# Patient Record
Sex: Male | Born: 1950 | Race: White | Hispanic: No | Marital: Married | Smoking: Former smoker
Health system: Southern US, Community
[De-identification: ages and names within clinical notes are randomized; demographics above are authoritative.]

## PROBLEM LIST (undated history)

## (undated) DIAGNOSIS — E663 Overweight: Secondary | ICD-10-CM

## (undated) DIAGNOSIS — K219 Gastro-esophageal reflux disease without esophagitis: Secondary | ICD-10-CM

## (undated) DIAGNOSIS — C801 Malignant (primary) neoplasm, unspecified: Secondary | ICD-10-CM

## (undated) DIAGNOSIS — E785 Hyperlipidemia, unspecified: Secondary | ICD-10-CM

## (undated) DIAGNOSIS — I10 Essential (primary) hypertension: Secondary | ICD-10-CM

## (undated) DIAGNOSIS — Z87442 Personal history of urinary calculi: Secondary | ICD-10-CM

## (undated) DIAGNOSIS — D2360 Other benign neoplasm of skin of unspecified upper limb, including shoulder: Secondary | ICD-10-CM

## (undated) HISTORY — PX: WRIST SURGERY: SHX841

## (undated) HISTORY — DX: Other benign neoplasm of skin of unspecified upper limb, including shoulder: D23.60

## (undated) HISTORY — DX: Overweight: E66.3

## (undated) HISTORY — PX: FINGER SURGERY: SHX640

## (undated) HISTORY — DX: Hyperlipidemia, unspecified: E78.5

## (undated) HISTORY — DX: Essential (primary) hypertension: I10

## (undated) HISTORY — PX: COLONOSCOPY: SHX174

## (undated) HISTORY — PX: OTHER SURGICAL HISTORY: SHX169

---

## 1999-07-28 ENCOUNTER — Encounter: Admission: RE | Admit: 1999-07-28 | Discharge: 1999-08-04 | Payer: Self-pay | Admitting: *Deleted

## 2003-09-07 LAB — HM COLONOSCOPY: HM Colonoscopy: NORMAL

## 2004-04-18 ENCOUNTER — Ambulatory Visit (HOSPITAL_COMMUNITY): Admission: RE | Admit: 2004-04-18 | Discharge: 2004-04-18 | Payer: Self-pay | Admitting: Gastroenterology

## 2007-12-31 ENCOUNTER — Encounter: Admission: RE | Admit: 2007-12-31 | Discharge: 2007-12-31 | Payer: Self-pay | Admitting: Family Medicine

## 2008-09-12 ENCOUNTER — Emergency Department (HOSPITAL_COMMUNITY): Admission: EM | Admit: 2008-09-12 | Discharge: 2008-09-12 | Payer: Self-pay | Admitting: Family Medicine

## 2009-04-21 ENCOUNTER — Ambulatory Visit (HOSPITAL_COMMUNITY): Admission: RE | Admit: 2009-04-21 | Discharge: 2009-04-21 | Payer: Self-pay | Admitting: Orthopedic Surgery

## 2010-12-19 ENCOUNTER — Encounter: Payer: Self-pay | Admitting: Nurse Practitioner

## 2010-12-19 DIAGNOSIS — E785 Hyperlipidemia, unspecified: Secondary | ICD-10-CM

## 2010-12-19 DIAGNOSIS — E1159 Type 2 diabetes mellitus with other circulatory complications: Secondary | ICD-10-CM | POA: Insufficient documentation

## 2010-12-19 DIAGNOSIS — I10 Essential (primary) hypertension: Secondary | ICD-10-CM

## 2010-12-19 DIAGNOSIS — E119 Type 2 diabetes mellitus without complications: Secondary | ICD-10-CM | POA: Insufficient documentation

## 2010-12-19 DIAGNOSIS — I152 Hypertension secondary to endocrine disorders: Secondary | ICD-10-CM | POA: Insufficient documentation

## 2010-12-19 DIAGNOSIS — M2550 Pain in unspecified joint: Secondary | ICD-10-CM | POA: Insufficient documentation

## 2010-12-22 NOTE — Op Note (Signed)
NAME:  Philip Huang, Philip Huang                         ACCOUNT NO.:  [id number]   MEDICAL RECORD NO.:  [id number]                   PATIENT TYPE:  AMB   LOCATION:  ENDO                                 FACILITY:  [facility]   PHYSICIAN:  John C. Madilyn Fireman, M.D.                 DATE OF BIRTH:  [birth year]   DATE OF PROCEDURE:  04/18/2004  DATE OF DISCHARGE:                                 OPERATIVE REPORT   PROCEDURE PERFORMED:  Colonoscopy.   ENDOSCOPIST:  Barrie Folk, M.D.   INDICATIONS FOR PROCEDURE:  Average risk colon cancer screening in a 51-year-  old patient.   DESCRIPTION OF PROCEDURE:  The patient was placed in the left lateral  decubitus position and placed on the pulse monitor with continuous low-flow  oxygen delivered by nasal cannula.  The patient was sedated with 70 mcg of  IV fentanyl and 7 mg of IV Versed.  The Olympus video colonoscope was  inserted into the rectum and advanced to the cecum, confirmed by  transillumination of McBurney's point and visualization of the ileocecal  valve and appendiceal orifice.  The prep was excellent.  The cecum,  ascending, transverse and descending colon appeared normal with no masses,  polyps, diverticula or other mucosal abnormalities.  The rectum likewise  appeared normal and retroflex view of the anus revealed no obvious internal  hemorrhoids.  The scope was then withdrawn and the patient returned to the  recovery room in stable condition.  He tolerated the procedure well.  There  were no immediate complications.   IMPRESSION:  Normal screening colonoscopy.   PLAN:  Next colon screening by sigmoidoscopy in 5 years.                                               John C. Madilyn Fireman, M.D.    [facility]MED[facility]  D:  04/18/2004  T:  04/18/2004  Job:  [id number]   cc:   Ernestina Penna, M.D.  [address] [place]  [place] [address]  Fax: [phone]

## 2011-02-02 ENCOUNTER — Emergency Department (HOSPITAL_COMMUNITY)
Admission: EM | Admit: 2011-02-02 | Discharge: 2011-02-02 | Discharge status: Against Medical Advice | Payer: Managed Care, Other (non HMO) | Attending: Emergency Medicine | Admitting: Emergency Medicine

## 2011-02-02 ENCOUNTER — Emergency Department (HOSPITAL_COMMUNITY): Payer: Managed Care, Other (non HMO)

## 2011-02-02 DIAGNOSIS — R079 Chest pain, unspecified: Secondary | ICD-10-CM | POA: Insufficient documentation

## 2011-02-02 DIAGNOSIS — Z79899 Other long term (current) drug therapy: Secondary | ICD-10-CM | POA: Insufficient documentation

## 2011-02-02 DIAGNOSIS — E7800 Pure hypercholesterolemia, unspecified: Secondary | ICD-10-CM | POA: Insufficient documentation

## 2011-02-02 DIAGNOSIS — I10 Essential (primary) hypertension: Secondary | ICD-10-CM | POA: Insufficient documentation

## 2011-02-02 DIAGNOSIS — E119 Type 2 diabetes mellitus without complications: Secondary | ICD-10-CM | POA: Insufficient documentation

## 2011-02-02 LAB — DIFFERENTIAL
Basophils Relative: 0 % (ref 0–1)
Eosinophils Absolute: 0.4 10*3/uL (ref 0.0–0.7)
Monocytes Absolute: 0.6 10*3/uL (ref 0.1–1.0)
Monocytes Relative: 9 % (ref 3–12)
Neutrophils Relative %: 55 % (ref 43–77)

## 2011-02-02 LAB — BASIC METABOLIC PANEL
Calcium: 9.1 mg/dL (ref 8.4–10.5)
Creatinine, Ser: 0.92 mg/dL (ref 0.50–1.35)
GFR calc Af Amer: >60 mL/min (ref >60)
GFR calc non Af Amer: >60 mL/min (ref >60)

## 2011-02-02 LAB — CK TOTAL AND CKMB (NOT AT ARMC): Total CK: 310 U/L — ABNORMAL HIGH (ref 7–232)

## 2011-02-02 LAB — CBC
MCH: 29.8 pg (ref 26.0–34.0)
MCHC: 34.9 g/dL (ref 30.0–36.0)
Platelets: 219 10*3/uL (ref 150–400)

## 2011-02-02 LAB — D-DIMER, QUANTITATIVE: D-Dimer, Quant: 0.26 ug/mL-FEU (ref 0.00–0.48)

## 2011-02-16 ENCOUNTER — Ambulatory Visit (INDEPENDENT_AMBULATORY_CARE_PROVIDER_SITE_OTHER): Payer: Managed Care, Other (non HMO) | Admitting: Cardiology

## 2011-02-16 ENCOUNTER — Encounter: Payer: Self-pay | Admitting: Cardiology

## 2011-02-16 DIAGNOSIS — E785 Hyperlipidemia, unspecified: Secondary | ICD-10-CM

## 2011-02-16 DIAGNOSIS — I10 Essential (primary) hypertension: Secondary | ICD-10-CM

## 2011-02-16 DIAGNOSIS — E119 Type 2 diabetes mellitus without complications: Secondary | ICD-10-CM

## 2011-02-16 DIAGNOSIS — E663 Overweight: Secondary | ICD-10-CM

## 2011-02-16 DIAGNOSIS — R079 Chest pain, unspecified: Secondary | ICD-10-CM | POA: Insufficient documentation

## 2011-02-16 DIAGNOSIS — Z6828 Body mass index (BMI) 28.0-28.9, adult: Secondary | ICD-10-CM | POA: Insufficient documentation

## 2011-02-16 NOTE — Progress Notes (Signed)
HPI The patient  Presents for evaluation of chest discomfort. This started a couple of weeks ago. He really only happens when he is exerting himself standing in one position at work. He apparently works with his arms. He describes some midsternal discomfort. It is mild. Comes and goes on its own. Is not sure how long it lasts. He does not describe radiation to his neck or to his arms.  He does not describe associated nausea vomiting or diaphoresis. He has no shortness of breath, PND or orthopnea. This is a new onset complaint and he has not had this before. He has had no prior cardiac history although he has significant risk factors.  Allergies no known allergies  Current Outpatient Prescriptions  Medication Sig Dispense Refill  . aspirin 81 MG tablet Take 325 mg by mouth daily.       . Cholecalciferol (VITAMIN D) 2000 UNITS tablet Take 2,000 Units by mouth daily.        . fenofibrate 160 MG tablet Take 160 mg by mouth daily.        Marland Kitchen lisinopril-hydrochlorothiazide (PRINZIDE,ZESTORETIC) 20-12.5 MG per tablet Take 1 tablet by mouth daily.        . metFORMIN (GLUCOPHAGE) 1000 MG tablet Take 1,000 mg by mouth 2 (two) times daily with a meal.        . Multiple Vitamins-Minerals (MENS 50+ ADVANCED PO) Take by mouth.        . nitroGLYCERIN (NITROSTAT) 0.4 MG SL tablet Place 0.4 mg under the tongue every 5 (five) minutes as needed.        . rosuvastatin (CRESTOR) 10 MG tablet Take 20 mg by mouth daily.       . vitamin C (ASCORBIC ACID) 500 MG tablet Take 500 mg by mouth daily.        . diclofenac (VOLTAREN) 75 MG EC tablet Take 75 mg by mouth 2 (two) times daily.        Marland Kitchen glipiZIDE (GLUCOTROL) 10 MG 24 hr tablet Take 10 mg by mouth daily.          Past Medical History  Diagnosis Date  . HTN (hypertension)   . Hyperlipidemia   . Diabetes mellitus   . Overweight     Past Surgical History  Procedure Date  . None     Family History  Problem Relation Age of Onset  . Diabetes Mother   . Heart  disease Mother 51    CABG  . Hyperlipidemia Mother   . Hypertension Mother   . Parkinsonism Father   . Diabetes Sister   . Hyperlipidemia Sister   . Hypertension Sister   . Diabetes Brother   . Hyperlipidemia Brother   . Hypertension Brother     History   Social History  . Marital Status: Married    Spouse Name: N/A    Number of Children: 2  . Years of Education: N/A   Occupational History  .  [employer]   Social History Main Topics  . Smoking status: Never Smoker   . Smokeless tobacco: Never Used  . Alcohol Use: Not on file  . Drug Use: Not on file  . Sexually Active: Not on file   Other Topics Concern  . Not on file   Social History Narrative  . No narrative on file    ROS:  As stated in the HPI and negative for all other systems.   PHYSICAL EXAM BP 132/97  Pulse 80  Resp 18  Ht 5\' 9"  (1.753 m)  Wt 213 lb 6.4 oz (96.798 kg)  BMI 31.51 kg/m2 GENERAL:  Well appearing HEENT:  Pupils equal round and reactive, fundi not visualized, oral mucosa unremarkable NECK:  No jugular venous distention, waveform within normal limits, carotid upstroke brisk and symmetric, no bruits, no thyromegaly LYMPHATICS:  No cervical, inguinal adenopathy LUNGS:  Clear to auscultation bilaterally BACK:  No CVA tenderness CHEST:  Unremarkable HEART:  PMI not displaced or sustained,S1 and S2 within normal limits, no S3, no S4, no clicks, no rubs, no murmurs ABD:  Flat, positive bowel sounds normal in frequency in pitch, no bruits, no rebound, no guarding, no midline pulsatile mass, no hepatomegaly, no splenomegaly EXT:  2 plus pulses throughout, no edema, no cyanosis no clubbing SKIN:  No rashes no nodules NEURO:  Cranial nerves II through XII grossly intact, motor grossly intact throughout PSYCH:  Cognitively intact, oriented to person place and time   EKG:  Sinus rhythm, rate 80, axis within normal limits, intervals within normal limits, no acute ST-T wave  changes.   ASSESSMENT AND PLAN

## 2011-02-16 NOTE — Assessment & Plan Note (Signed)
The patient has new onset chest pain and this is quite concerning for unstable angina. I think the pretest probability of obstructive coronary disease is at least moderately high. Therefore, stress perfusion imaging would be indicated. I would not believe the results of plain exercise treadmill test as I think the sensitivity is too low in this situation. This is per appropriateness guidelines.

## 2011-02-16 NOTE — Assessment & Plan Note (Signed)
The patient understands the need to lose weight with diet and exercise. We have discussed specific strategies for this.  

## 2011-02-16 NOTE — Patient Instructions (Signed)
Your physician has requested that you have en exercise stress myoview. For further information please visit [email or web address] Please follow instruction sheet, as given.  Please continue your current medications  Follow up will be as needed

## 2011-02-16 NOTE — Assessment & Plan Note (Signed)
Is limited and not at target. I reviewed these. These had his meds adjusted recently and I will defer to the excellent management from his primary providers.

## 2011-02-16 NOTE — Assessment & Plan Note (Signed)
The blood pressure is at target. No change in medications is indicated. We will continue with therapeutic lifestyle changes (TLC).  

## 2011-02-20 ENCOUNTER — Encounter: Payer: Self-pay | Admitting: *Deleted

## 2011-02-22 ENCOUNTER — Ambulatory Visit (HOSPITAL_COMMUNITY): Payer: Managed Care, Other (non HMO) | Attending: Cardiology | Admitting: Radiology

## 2011-02-22 DIAGNOSIS — I10 Essential (primary) hypertension: Secondary | ICD-10-CM | POA: Insufficient documentation

## 2011-02-22 DIAGNOSIS — E119 Type 2 diabetes mellitus without complications: Secondary | ICD-10-CM

## 2011-02-22 DIAGNOSIS — R5381 Other malaise: Secondary | ICD-10-CM | POA: Insufficient documentation

## 2011-02-22 DIAGNOSIS — R5383 Other fatigue: Secondary | ICD-10-CM | POA: Insufficient documentation

## 2011-02-22 DIAGNOSIS — I4949 Other premature depolarization: Secondary | ICD-10-CM

## 2011-02-22 DIAGNOSIS — Z8249 Family history of ischemic heart disease and other diseases of the circulatory system: Secondary | ICD-10-CM | POA: Insufficient documentation

## 2011-02-22 DIAGNOSIS — R0789 Other chest pain: Secondary | ICD-10-CM

## 2011-02-22 MED ORDER — TECHNETIUM TC 99M TETROFOSMIN IV KIT
33.0000 | PACK | Freq: Once | INTRAVENOUS | Status: AC | PRN
  Administered 2011-02-22: 33 via INTRAVENOUS

## 2011-02-22 MED ORDER — TECHNETIUM TC 99M TETROFOSMIN IV KIT
11.0000 | PACK | Freq: Once | INTRAVENOUS | Status: AC | PRN
  Administered 2011-02-22: 11 via INTRAVENOUS

## 2011-02-22 NOTE — Progress Notes (Addendum)
[facility] SITE 3 NUCLEAR MED [address] [place] [address] [phone]  Cardiology Nuclear Med Study  Philip Huang is a 60 y.o. male [id number] [birth year]   Nuclear Med Background Indication for Stress Test:  Evaluation for Ischemia and Post Hospital on 02/02/11: Chest pain with (+) enzymes (patient refused admission , Left AMA) History:  No previous documented CAD and '87 GXT:OK per patient Cardiac Risk Factors: Family History - CAD, Hypertension, Lipids and NIDDM  Symptoms:  Chest Pressure with and without Exertion (none since leaving hospital) and Fatigue   Nuclear Pre-Procedure Caffeine/Decaff Intake:  None NPO After: 7:00pm   Lungs:  Clear. IV 0.9% NS with Angio Cath:  20g  IV Site: R Wrist  IV Started by:  Irean Hong, RN  Chest Size (in):  44 Cup Size: n/a  Height: 5\' 9"  (1.753 m)  Weight:  207 lb (93.895 kg)  BMI:  Body mass index is 30.57 kg/(m^2). Tech Comments:  Held diabetic po medication today.    Nuclear Med Study 1 or 2 day study: 1 day  Stress Test Type:  Stress  Reading MD: Dietrich Pates, MD  Order Authorizing Provider:  Rollene Rotunda, MD  Resting Radionuclide: Technetium 67m Tetrofosmin  Resting Radionuclide Dose: 11 mCi   Stress Radionuclide:  Technetium 60m Tetrofosmin  Stress Radionuclide Dose: 33 mCi           Stress Protocol Rest HR: 74 Stress HR: 151  Rest BP: 123/71 Stress BP: 198/99  Exercise Time (min): 7:21 METS: 9.0   Predicted Max HR: 162 bpm % Max HR: 93.21 bpm Rate Pressure Product: 29528   Dose of Adenosine (mg):  n/a Dose of Lexiscan: n/a mg  Dose of Atropine (mg): n/a Dose of Dobutamine: n/a mcg/kg/min (at max HR)  Stress Test Technologist: Smiley Houseman, CMA-N  Nuclear Technologist:  Domenic Polite, CNMT     Rest Procedure:  Myocardial perfusion imaging was performed at rest 45 minutes following the intravenous administration of Technetium 65m Tetrofosmin.  Rest ECG: No acute changes.  Stress  Procedure:  The patient exercised for 7:21 on the treadmill utilizing the Bruce protocol.  The patient stopped due to fatigue and denied any chest pain.  There were no significant ST-T wave changes during exercise, but there were nonspecific T-wave changes in recovery and occasional PVC's throughout.  Technetium 28m Tetrofosmin was injected at peak exercise and myocardial perfusion imaging was performed after a brief delay.  Stress ECG: No significant change from baseline ECG  QPS Raw Data Images:  Stress images were motion corrected.  Soft tissue (diaphragm, bowel activity) underlie heart. Stress Images:  Minimal thinning in the inferior wall (mid).  Otherwise normal perfusion. Rest Images: Inferior changes a little more prominent. Subtraction (SDS):  No evidence of ischemia. Transient Ischemic Dilatation (Normal <1.22):  1.05 Lung/Heart Ratio (Normal <0.45):  .42  Quantitative Gated Spect Images QGS EDV:  97 ml QGS ESV:  31 ml QGS cine images:  NL LV Function; NL Wall Motion QGS EF: 68%  Impression Exercise Capacity:  Good exercise capacity. BP Response:  Normal blood pressure response. Clinical Symptoms:  No chest pain. ECG Impression:  No significant ST segment change suggestive of ischemia. Comparison with Prior Nuclear Study: No previous nuclear study performed  Overall Impression:  Normal perfusion with minimal soft tissue attenuation inferiorly.  No ischemia or scar. Dietrich Pates  No evidence of ischemia or infarct.  No further work up.  Rollene Rotunda

## 2011-02-23 ENCOUNTER — Encounter: Payer: Self-pay | Admitting: Cardiology

## 2011-02-23 NOTE — Progress Notes (Signed)
NUCLEAR REPORT TO DR. HOCHREIN. Kieth Hartis, Farris Has

## 2011-02-26 NOTE — Progress Notes (Signed)
Left message of results on pt's private voicemail. 

## 2011-03-07 ENCOUNTER — Encounter: Payer: Self-pay | Admitting: Cardiology

## 2012-02-22 ENCOUNTER — Other Ambulatory Visit: Payer: Self-pay | Admitting: Family Medicine

## 2012-02-25 ENCOUNTER — Ambulatory Visit (HOSPITAL_COMMUNITY)
Admission: RE | Admit: 2012-02-25 | Discharge: 2012-02-25 | Disposition: A | Discharge status: Home / Self Care | Payer: Managed Care, Other (non HMO) | Source: Ambulatory Visit | Attending: Family Medicine | Admitting: Family Medicine

## 2012-02-25 DIAGNOSIS — K7689 Other specified diseases of liver: Secondary | ICD-10-CM | POA: Insufficient documentation

## 2012-02-25 DIAGNOSIS — R748 Abnormal levels of other serum enzymes: Secondary | ICD-10-CM | POA: Insufficient documentation

## 2012-02-25 DIAGNOSIS — R161 Splenomegaly, not elsewhere classified: Secondary | ICD-10-CM | POA: Insufficient documentation

## 2012-06-18 IMAGING — CR DG CHEST 2V
2 series · 2 of 2 positions shown · non-contrast
Comparison: 09/12/2008

CLINICAL DATA: Chest pain, diabetes, hypertension

CHEST - 2 VIEW

[w chest pa]
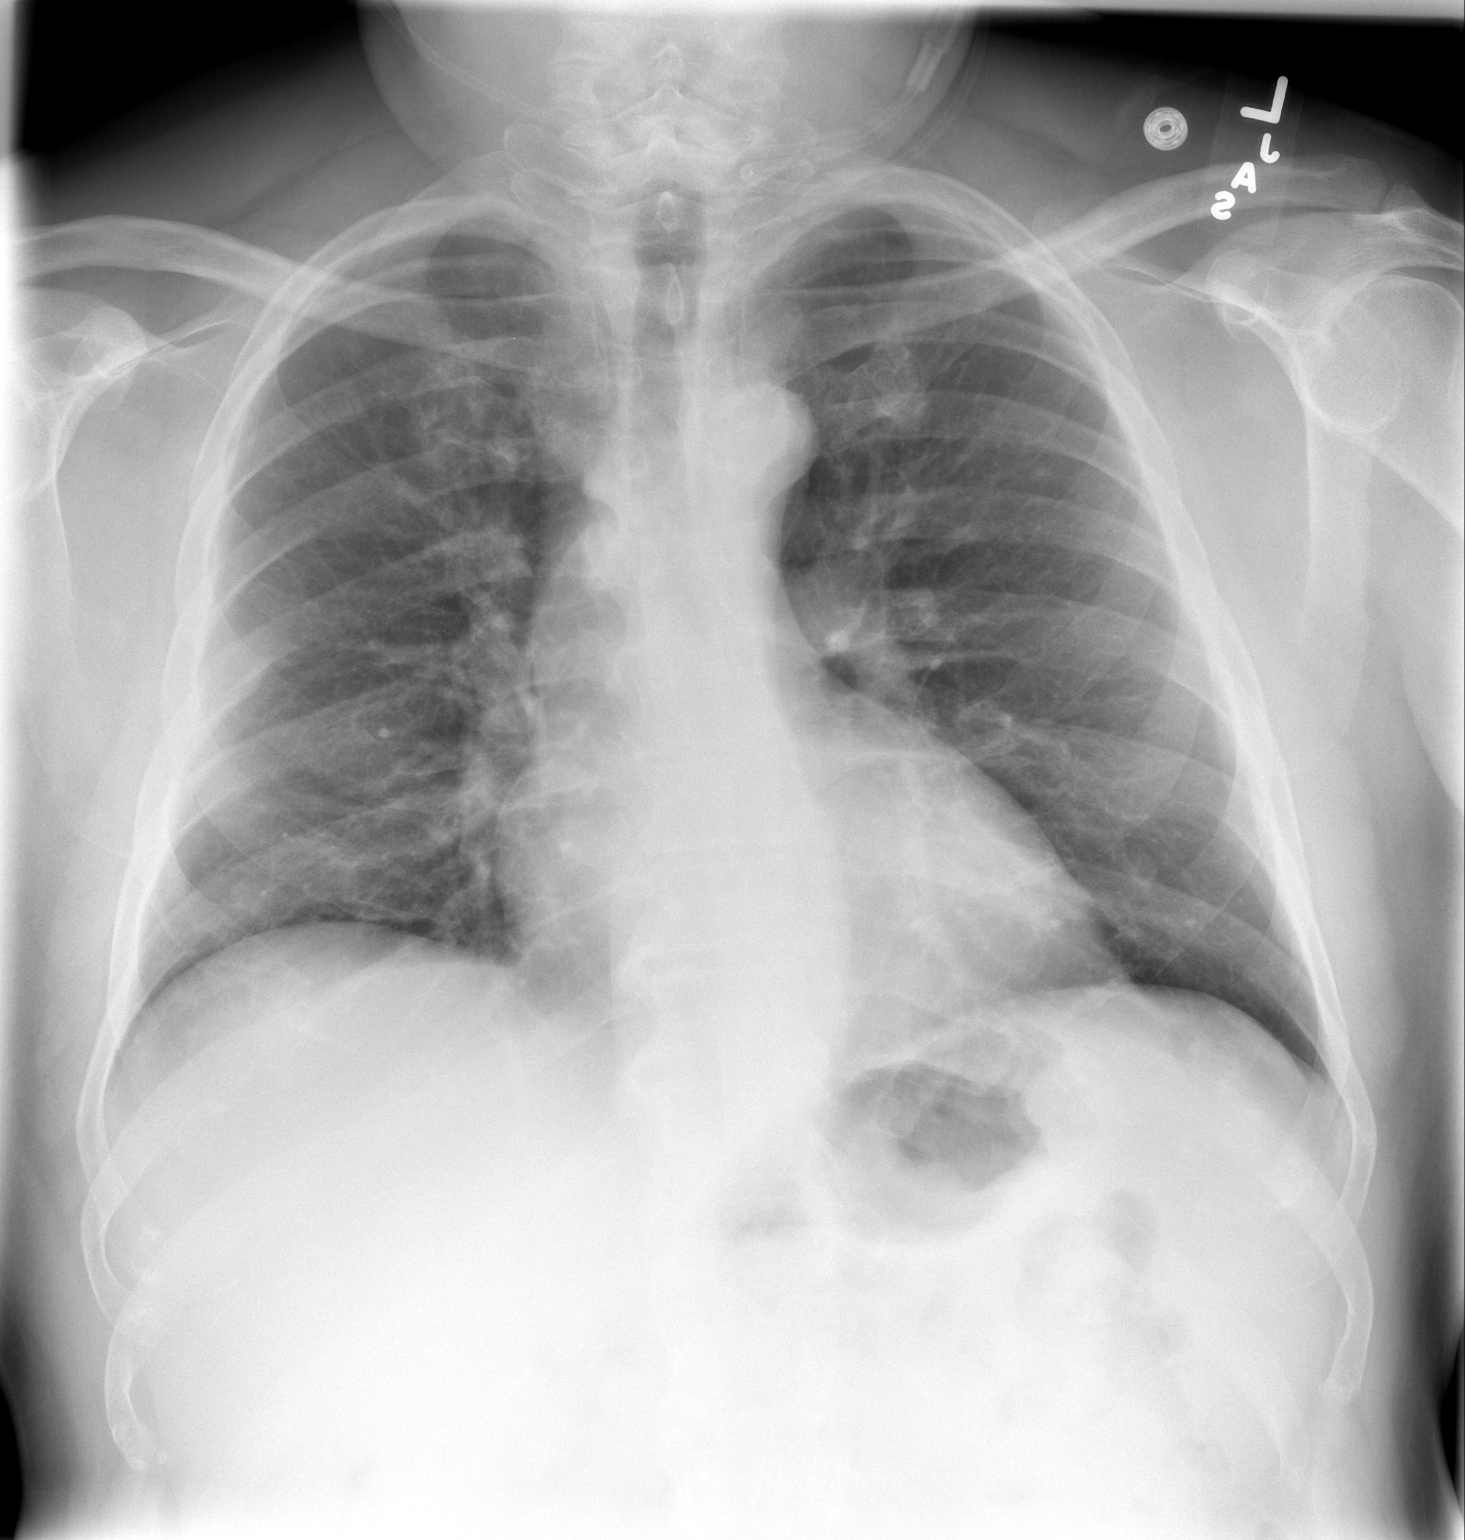

[w chest lat]
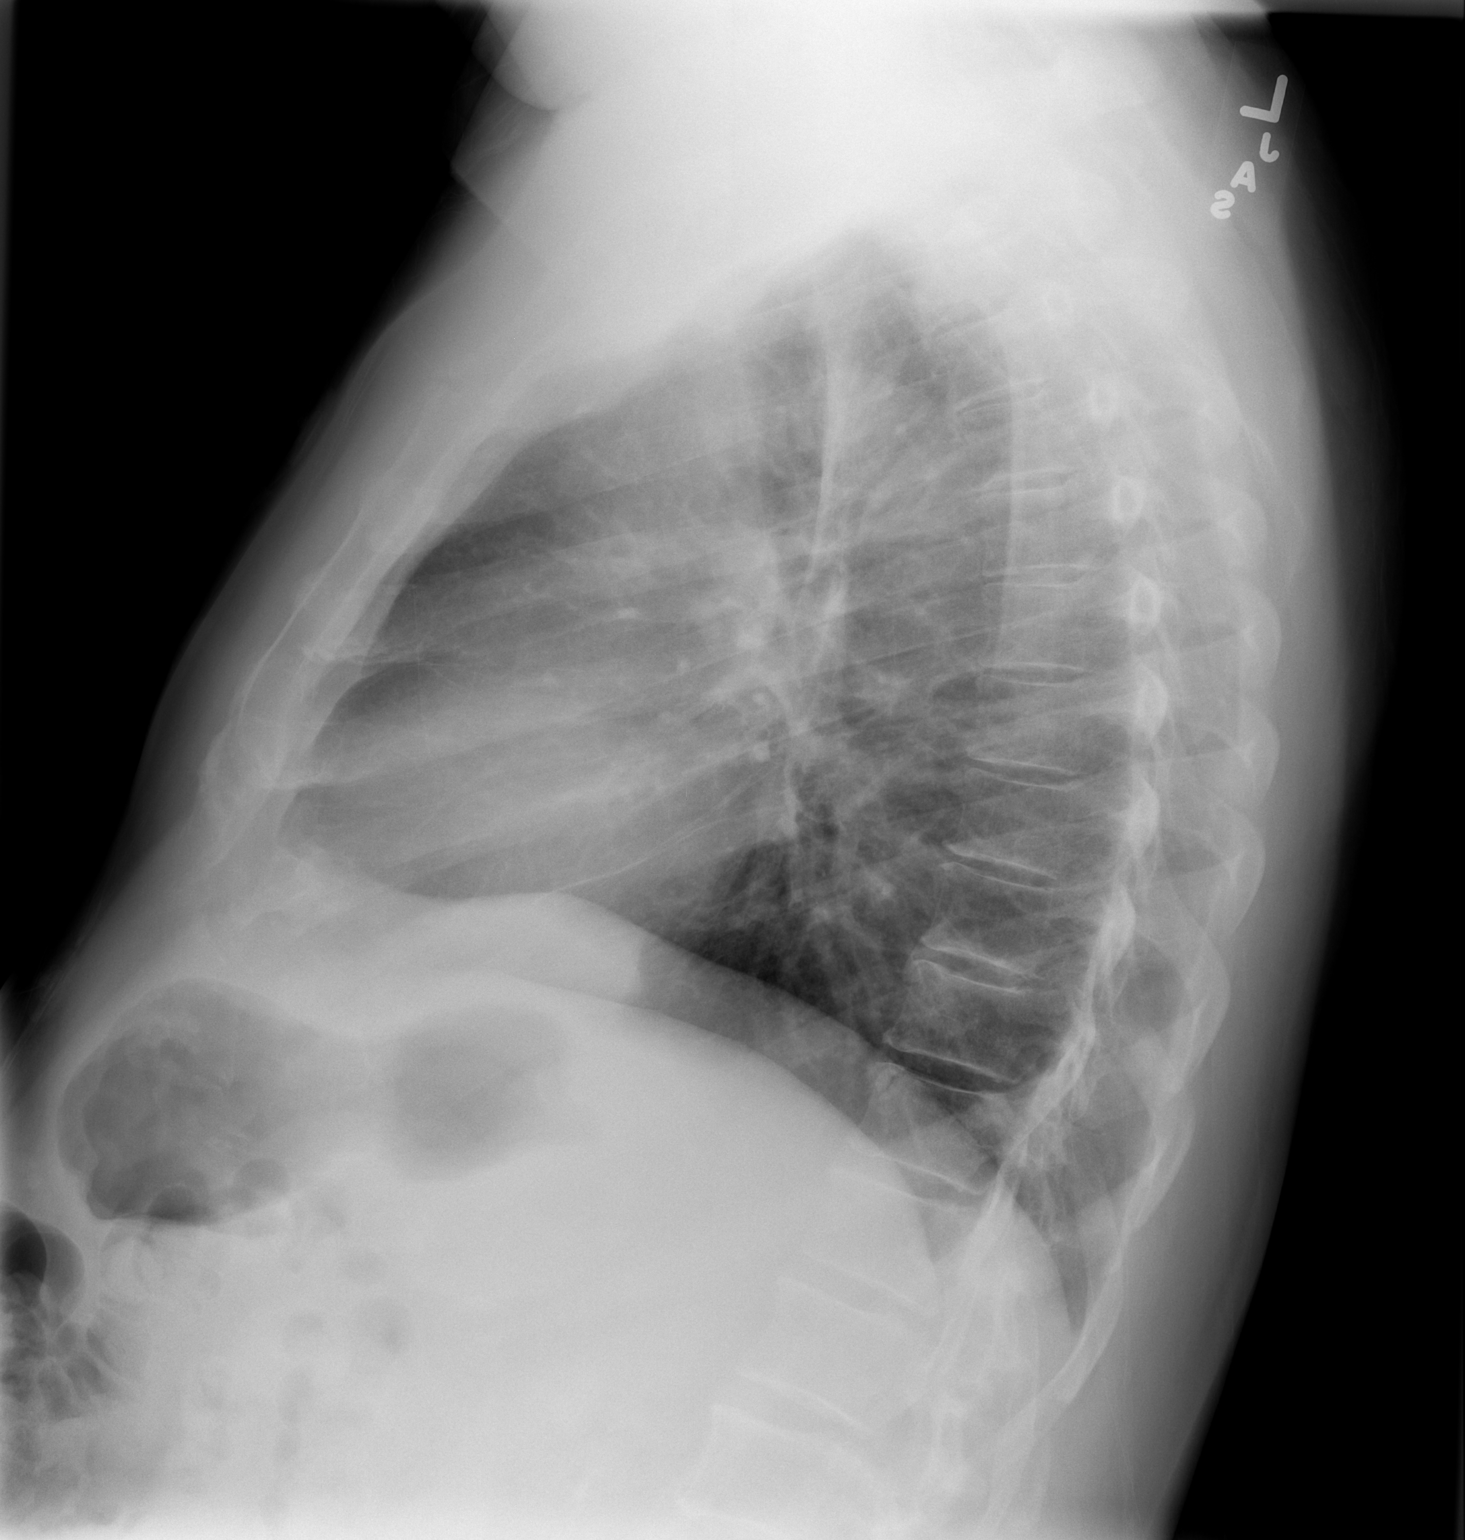

[2 of 2 positions shown; findings below may reference images not displayed]

FINDINGS: Enlargement of cardiac silhouette.
Tortuous aorta.
Pulmonary vascularity normal.
Mild right basilar atelectasis.
Question bilateral nipple shadows.
No pulmonary infiltrate, pleural effusion, or pneumothorax.
Multilevel endplate spur formation thoracic spine.
Probable tiny calcified granuloma left upper lobe.
Old fracture posterolateral right 6th rib.
Minimal peribronchial thickening.
IMPRESSION: Minimal bronchitic changes in right basilar atelectasis.
Question bilateral nipple shadows; recommend repeat PA chest
radiograph with nipple markers to exclude pulmonary nodules.

## 2012-10-22 ENCOUNTER — Telehealth: Payer: Self-pay | Admitting: Nurse Practitioner

## 2012-10-22 NOTE — Telephone Encounter (Signed)
Samples zetia and crestor please.

## 2012-10-22 NOTE — Telephone Encounter (Signed)
Samples of crestor 20mg  and zetia 10mg  up front pt aware

## 2012-10-22 NOTE — Telephone Encounter (Signed)
Ok to give samples if we have .

## 2012-10-22 NOTE — Telephone Encounter (Signed)
Chart on desk

## 2012-11-07 ENCOUNTER — Telehealth: Payer: Self-pay | Admitting: Nurse Practitioner

## 2012-11-07 NOTE — Telephone Encounter (Signed)
PLEASE ADVISE.

## 2012-11-07 NOTE — Telephone Encounter (Signed)
Ok for samples that we have

## 2012-11-07 NOTE — Telephone Encounter (Signed)
Samples up front 

## 2012-11-11 ENCOUNTER — Other Ambulatory Visit: Payer: Self-pay | Admitting: *Deleted

## 2012-11-11 MED ORDER — FENOFIBRATE 160 MG PO TABS: 160.0000 mg | ORAL_TABLET | Freq: Every day | ORAL | Status: DC

## 2012-11-13 ENCOUNTER — Other Ambulatory Visit: Payer: Self-pay | Admitting: *Deleted

## 2012-11-13 ENCOUNTER — Telehealth: Payer: Self-pay | Admitting: Nurse Practitioner

## 2012-11-13 MED ORDER — FENOFIBRATE 160 MG PO TABS: 160.0000 mg | ORAL_TABLET | Freq: Every day | ORAL | Status: DC

## 2012-11-13 NOTE — Telephone Encounter (Signed)
Last labs 9/13

## 2012-12-17 ENCOUNTER — Other Ambulatory Visit: Payer: Self-pay | Admitting: Nurse Practitioner

## 2013-01-04 ENCOUNTER — Other Ambulatory Visit: Payer: Self-pay | Admitting: Nurse Practitioner

## 2013-01-05 ENCOUNTER — Telehealth: Payer: Self-pay | Admitting: Nurse Practitioner

## 2013-01-06 ENCOUNTER — Telehealth: Payer: Self-pay | Admitting: Nurse Practitioner

## 2013-01-06 MED ORDER — FENOFIBRATE 160 MG PO TABS: 160.0000 mg | ORAL_TABLET | Freq: Every day | ORAL | Status: DC

## 2013-01-06 NOTE — Telephone Encounter (Signed)
1 month only given, must be seen

## 2013-01-07 ENCOUNTER — Ambulatory Visit (INDEPENDENT_AMBULATORY_CARE_PROVIDER_SITE_OTHER): Payer: BLUE CROSS/BLUE SHIELD | Admitting: General Practice

## 2013-01-07 ENCOUNTER — Telehealth: Payer: Self-pay | Admitting: Nurse Practitioner

## 2013-01-07 ENCOUNTER — Encounter: Payer: Self-pay | Admitting: General Practice

## 2013-01-07 VITALS — BP 124/73 | HR 85 | Temp 99.2°F | Ht 68.5 in | Wt 203.0 lb

## 2013-01-07 DIAGNOSIS — L255 Unspecified contact dermatitis due to plants, except food: Secondary | ICD-10-CM

## 2013-01-07 DIAGNOSIS — L237 Allergic contact dermatitis due to plants, except food: Secondary | ICD-10-CM

## 2013-01-07 MED ORDER — PREDNISONE (PAK) 10 MG PO TABS: ORAL_TABLET | ORAL | Status: DC

## 2013-01-07 MED ORDER — METHYLPREDNISOLONE ACETATE 80 MG/ML IJ SUSP
80.0000 mg | Freq: Once | INTRAMUSCULAR | Status: AC
  Administered 2013-01-07: 80 mg via INTRAMUSCULAR

## 2013-01-07 NOTE — Progress Notes (Signed)
  Subjective:    Patient ID: Philip Huang, male    DOB: 02-Jul-1951, 62 y.o.   MRN: [id number]  Rash This is a new problem. The current episode started in the past 7 days. The problem has been gradually improving since onset. The affected locations include the right arm and left arm. The rash is characterized by itchiness and redness. He was exposed to plant contact. Pertinent negatives include no congestion, cough, fever, rhinorrhea, shortness of breath or vomiting. Past treatments include anti-itch cream.      Review of Systems  Constitutional: Negative for fever and chills.  HENT: Negative for congestion and rhinorrhea.   Respiratory: Negative for cough, chest tightness and shortness of breath.   Cardiovascular: Negative for chest pain and palpitations.  Gastrointestinal: Negative for vomiting.  Skin: Positive for rash.       Red rash noted to arms, abdomen       Objective:   Physical Exam  Constitutional: He is oriented to person, place, and time. He appears well-developed and well-nourished.  Cardiovascular: Normal rate, regular rhythm and normal heart sounds.   Pulmonary/Chest: Effort normal and breath sounds normal. No respiratory distress. He exhibits no tenderness.  Neurological: He is alert and oriented to person, place, and time.  Skin: Skin is warm and dry. Rash noted. There is erythema.  Erythematous rash noted to bilateral arms and left abdomen. Well demarcated borders. Negative for warmth to touch.   Psychiatric: He has a normal mood and affect.          Assessment & Plan:  1. Poison oak dermatitis - methylPREDNISolone acetate (DEPO-MEDROL) injection 80 mg; Inject 1 mL (80 mg total) into the muscle once. - predniSONE (STERAPRED UNI-PAK) 10 MG tablet; Take as directed. Start tomorrow on 01/08/13  Dispense: 21 tablet; Refill: 0 -Avoid irritants -RTO if symptoms worsen -Patient verbalized understanding Coralie Keens, FNP-C

## 2013-01-07 NOTE — Telephone Encounter (Signed)
Appt given for today 

## 2013-01-07 NOTE — Patient Instructions (Addendum)
Poison Oak Poison oak is an inflammation of the skin (contact dermatitis). It is caused by contact with the allergens on the leaves of the oak (toxicodendron) plants. Depending on your sensitivity, the rash may consist simply of redness and itching, or it may also progress to blisters which may break open (rupture). These must be well cared for to prevent secondary germ (bacterial) infection as these infections can lead to scarring. The eyes may also get puffy. The puffiness is worst in the morning and gets better as the day progresses. Healing is best accomplished by keeping any open areas dry, clean, covered with a bandage, and covered with an antibacterial ointment if needed. Without secondary infection, this dermatitis usually heals without scarring within 2 to 3 weeks without treatment. HOME CARE INSTRUCTIONS When you have been exposed to poison oak, it is very important to thoroughly wash with soap and water as soon as the exposure has been discovered. You have about one half hour to remove the plant resin before it will cause the rash. This cleaning will quickly destroy the oil or antigen on the skin (the antigen is what causes the rash). Wash aggressively under the fingernails as any plant resin still there will continue to spread the rash. Do not rub skin vigorously when washing affected area. Poison oak cannot spread if no oil from the plant remains on your body. Rash that has progressed to weeping sores (lesions) will not spread the rash unless you have not washed thoroughly. It is also important to clean any clothes you have been wearing as they may carry active allergens which will spread the rash, even several days later. Avoidance of the plant in the future is the best measure. Poison oak plants can be recognized by the number of leaves. Generally, poison oak has three leaves with flowering branches on a single stem. Diphenhydramine may be purchased over the counter and used as needed for  itching. Do not drive with this medication if it makes you drowsy. Ask your caregiver about medication for children. SEEK IMMEDIATE MEDICAL CARE IF:   Open areas of the rash develop.  You notice redness extending beyond the area of the rash.  There is a pus like discharge.  There is increased pain.  Other signs of infection develop (such as fever). Document Released: 01/27/2003 Document Revised: 10/15/2011 Document Reviewed: 06/08/2009 ExitCare Patient Information 2014 [employer].  

## 2013-01-07 NOTE — Telephone Encounter (Signed)
No samples available. Can rx be sent in

## 2013-01-07 NOTE — Telephone Encounter (Signed)
zetia samples ok

## 2013-01-08 ENCOUNTER — Telehealth: Payer: Self-pay | Admitting: Family Medicine

## 2013-01-09 MED ORDER — EZETIMIBE 10 MG PO TABS: 10.0000 mg | ORAL_TABLET | Freq: Every day | ORAL | Status: DC

## 2013-01-09 NOTE — Telephone Encounter (Signed)
rx sent to [facility]

## 2013-01-09 NOTE — Telephone Encounter (Signed)
Taking care of in another encounter.

## 2013-01-15 ENCOUNTER — Encounter: Payer: Self-pay | Admitting: Nurse Practitioner

## 2013-01-15 ENCOUNTER — Telehealth: Payer: Self-pay | Admitting: Nurse Practitioner

## 2013-01-15 ENCOUNTER — Ambulatory Visit (INDEPENDENT_AMBULATORY_CARE_PROVIDER_SITE_OTHER): Payer: BLUE CROSS/BLUE SHIELD | Admitting: Nurse Practitioner

## 2013-01-15 VITALS — BP 139/73 | HR 91 | Temp 98.1°F | Ht 69.0 in | Wt 200.0 lb

## 2013-01-15 DIAGNOSIS — L259 Unspecified contact dermatitis, unspecified cause: Secondary | ICD-10-CM

## 2013-01-15 MED ORDER — PREDNISONE (PAK) 10 MG PO TABS: ORAL_TABLET | ORAL | Status: DC

## 2013-01-15 NOTE — Patient Instructions (Signed)
Latex Allergy   Latex allergies are a reaction to the flexible, elastic material used in many rubber products. Examples of these products are rubber gloves, adhesive tape and bandages, rubber toys, balloons, rubber bands, condoms and many other items. Particles of latex can also be inhaled once they become airborne. When very high levels of latex are present, asthma symptoms can be triggered. Latex allergies also are related to certain foods such as avocados, bananas, chestnuts, kiwis and passion fruits. If you're allergic to latex, you have a greater chance of also being allergic to these foods.  SYMPTOMS   · Stuffy nose and/or sneezing.  · Cough.  · Hives or rash.  · Itching.  · Itchy and watery eyes.  · Difficulty breathing.  HOME CARE INSTRUCTIONS   · Avoid your exposure to latex products.  · Be careful of products labeled hypoallergenic. This label does not mean these products do not contain latex.  · See a doctor for allergy testing.  · If a doctor confirms a Latex Allergy, wear a medical alert bracelet or necklace that identifies your allergy.  · Take any medications exactly as prescribed.  SEEK IMMEDIATE MEDICAL CARE IF:   · You have difficulty breathing.  · You have chest tightness.  · You have confusion or slurred speech.  · You have redness, swelling, or pain that is getting worse not better.  MAKE SURE YOU:   · Understand these instructions.  · Will watch your condition.  · Will get help right away if you are not doing well or get worse.  Document Released: 04/28/2004 Document Revised: 10/15/2011 Document Reviewed: 06/16/2008  ExitCare® Patient Information ©2014 [employer].

## 2013-01-15 NOTE — Progress Notes (Signed)
  Subjective:    Patient ID: Philip Huang, male    DOB: March 08, 1951, 62 y.o.   MRN: [id number]  HPI  Patient in with poison oak- was seen last week and got a shot and some oral steroids to take- got better but broke out again in last 2-3 days- itching on wrists- Patient just started a new job and is weraing latex gloves and his hands are completely broke out and itching.    Review of Systems     Objective:   Physical Exam  Constitutional: He appears well-developed and well-nourished.  Cardiovascular: Normal rate and normal heart sounds.   Pulmonary/Chest: Effort normal and breath sounds normal.  Skin:  bil hands are erythematous and swollen with tiny papular lesions. Rash left elbow erythematous annular patch areas - similar rash on right flank    BP 139/73  Pulse 91  Temp(Src) 98.1 F (36.7 C) (Oral)  Ht 5\' 9"  (1.753 m)  Wt 200 lb (90.719 kg)  BMI 29.52 kg/m2       Assessment & Plan:  1. Contact dermatitis Stop wearing latex gloves- note written for work requiring latex free gloves Avoid scratching Avoid hot water and heat RTO prn    - predniSONE (STERAPRED UNI-PAK) 10 MG tablet; As directed X 6 days  Dispense: 21 tablet; Refill: 0  Mary-Margaret Daphine Deutscher, FNP

## 2013-01-15 NOTE — Telephone Encounter (Signed)
Appointment made for night clinic 01/15/13

## 2013-02-02 ENCOUNTER — Ambulatory Visit (INDEPENDENT_AMBULATORY_CARE_PROVIDER_SITE_OTHER): Payer: BC Managed Care – PPO | Admitting: Nurse Practitioner

## 2013-02-02 ENCOUNTER — Telehealth: Payer: Self-pay | Admitting: Nurse Practitioner

## 2013-02-02 ENCOUNTER — Encounter: Payer: Self-pay | Admitting: Nurse Practitioner

## 2013-02-02 VITALS — BP 116/74 | HR 71 | Temp 98.6°F | Ht 68.0 in | Wt 198.0 lb

## 2013-02-02 DIAGNOSIS — Z0000 Encounter for general adult medical examination without abnormal findings: Secondary | ICD-10-CM

## 2013-02-02 DIAGNOSIS — I10 Essential (primary) hypertension: Secondary | ICD-10-CM

## 2013-02-02 DIAGNOSIS — E785 Hyperlipidemia, unspecified: Secondary | ICD-10-CM

## 2013-02-02 DIAGNOSIS — R5383 Other fatigue: Secondary | ICD-10-CM

## 2013-02-02 DIAGNOSIS — E119 Type 2 diabetes mellitus without complications: Secondary | ICD-10-CM

## 2013-02-02 DIAGNOSIS — R5381 Other malaise: Secondary | ICD-10-CM

## 2013-02-02 LAB — COMPLETE METABOLIC PANEL WITH GFR
ALT: 29 U/L (ref 0–53)
AST: 17 U/L (ref 0–37)
Albumin: 4.5 g/dL (ref 3.5–5.2)
Alkaline Phosphatase: 42 U/L (ref 39–117)
Potassium: 4.2 mEq/L (ref 3.5–5.3)
Sodium: 139 mEq/L (ref 135–145)
Total Protein: 6.4 g/dL (ref 6.0–8.3)

## 2013-02-02 LAB — POCT GLYCOSYLATED HEMOGLOBIN (HGB A1C): Hemoglobin A1C: 9.2

## 2013-02-02 LAB — THYROID PANEL WITH TSH
Free Thyroxine Index: 2.9 (ref 1.0–3.9)
T3 Uptake: 36.7 % (ref 22.5–37.0)
TSH: 2.501 u[IU]/mL (ref 0.350–4.500)

## 2013-02-02 LAB — POCT URINALYSIS DIPSTICK
Blood, UA: NEGATIVE
Glucose, UA: NEGATIVE
Ketones, UA: NEGATIVE
Spec Grav, UA: >=1.03
Urobilinogen, UA: NEGATIVE

## 2013-02-02 LAB — POCT UA - MICROALBUMIN: Microalbumin Ur, POC: 50 mg/L

## 2013-02-02 LAB — POCT UA - MICROSCOPIC ONLY
Crystals, Ur, HPF, POC: NEGATIVE
RBC, urine, microscopic: NEGATIVE

## 2013-02-02 MED ORDER — GLIPIZIDE ER 10 MG PO TB24: 10.0000 mg | ORAL_TABLET | Freq: Every day | ORAL | Status: DC

## 2013-02-02 MED ORDER — LISINOPRIL-HYDROCHLOROTHIAZIDE 20-12.5 MG PO TABS: 1.0000 | ORAL_TABLET | Freq: Every day | ORAL | Status: DC

## 2013-02-02 MED ORDER — FENOFIBRATE 160 MG PO TABS: 160.0000 mg | ORAL_TABLET | Freq: Every day | ORAL | Status: DC

## 2013-02-02 NOTE — Patient Instructions (Addendum)

## 2013-02-02 NOTE — Progress Notes (Signed)
Subjective:    Patient ID: Philip Huang, male    DOB: [birth year], 62 y.o.   MRN: [id number]  Hypertension This is a chronic problem. The current episode started more than 1 year ago. The problem has been resolved since onset. The problem is controlled. Pertinent negatives include no anxiety, blurred vision, chest pain, headaches, malaise/fatigue, orthopnea, palpitations, peripheral edema or shortness of breath. There are no associated agents to hypertension. Risk factors for coronary artery disease include diabetes mellitus, dyslipidemia and male gender. Past treatments include ACE inhibitors and diuretics. The current treatment provides mild improvement. There are no compliance problems.  Hypertensive end-organ damage includes CAD/MI.  Hyperlipidemia This is a chronic problem. The current episode started more than 1 year ago. The problem is controlled. Recent lipid tests were reviewed and are normal. Exacerbating diseases include diabetes. He has no history of hypothyroidism, liver disease or obesity. Pertinent negatives include no chest pain or shortness of breath. Current antihyperlipidemic treatment includes statins. The current treatment provides moderate improvement of lipids. Compliance problems include adherence to diet and adherence to exercise.  Risk factors for coronary artery disease include diabetes mellitus, hypertension and male sex.  Diabetes He presents for his follow-up diabetic visit. He has type 2 diabetes mellitus. No MedicAlert identification noted. His disease course has been stable. There are no hypoglycemic associated symptoms. Pertinent negatives for hypoglycemia include no headaches. There are no diabetic associated symptoms. Pertinent negatives for diabetes include no blurred vision and no chest pain. There are no hypoglycemic complications. Symptoms are stable. There are no diabetic complications. Risk factors for coronary artery disease include dyslipidemia, hypertension  and male sex. When asked about current treatments, none were reported. He is compliant with treatment most of the time. His weight is stable. When asked about meal planning, he reported none. He has not had a previous visit with a dietician. He rarely participates in exercise. There is no change in his home blood glucose trend. His breakfast blood glucose is taken between 7-8 am. His breakfast blood glucose range is generally 130-140 mg/dl. (Patient has been on prednisone for poison oak 3 times this year.) An ACE inhibitor/angiotensin II receptor blocker is being taken. He does not see a podiatrist.     Review of Systems  Constitutional: Negative for malaise/fatigue.  Eyes: Negative for blurred vision.  Respiratory: Negative for shortness of breath.   Cardiovascular: Negative for chest pain, palpitations and orthopnea.  Neurological: Negative for headaches.  All other systems reviewed and are negative.       Objective:   Physical Exam  Constitutional: He is oriented to person, place, and time. He appears well-developed and well-nourished.  HENT:  Head: Normocephalic.  Right Ear: External ear normal.  Left Ear: External ear normal.  Nose: Nose normal.  Mouth/Throat: Oropharynx is clear and moist.  Eyes: EOM are normal. Pupils are equal, round, and reactive to light.  Neck: Normal range of motion. Neck supple. No thyromegaly present.  Cardiovascular: Normal rate, regular rhythm, normal heart sounds and intact distal pulses.   No murmur heard. Pulmonary/Chest: Effort normal and breath sounds normal. He has no wheezes. He has no rales.  Abdominal: Soft. Bowel sounds are normal.  Musculoskeletal: Normal range of motion.  Neurological: He is alert and oriented to person, place, and time.  Skin: Skin is warm and dry.  Psychiatric: He has a normal mood and affect. His behavior is normal. Judgment and thought content normal.    BP 116/74  Pulse 71  Temp(Src) 98.6  F (37 C) (Oral)  Ht  5\' 8"  (1.727 m)  Wt 198 lb (89.812 kg)  BMI 30.11 kg/m2   Results for orders placed in visit on 02/02/13  POCT GLYCOSYLATED HEMOGLOBIN (HGB A1C)      Result Value Range   Hemoglobin A1C 9.2    POCT UA - MICROALBUMIN      Result Value Range   Microalbumin Ur, POC 50     Creatinine, POC       Albumin/Creatinine Ratio, Urine, POC      POCT URINALYSIS DIPSTICK      Result Value Range   Color, UA amber     Clarity, UA cloudy     Glucose, UA neg     Bilirubin, UA neg     Ketones, UA neg     Spec Grav, UA >=1.030     Blood, UA neg     pH, UA 6.5     Protein, UA neg     Urobilinogen, UA negative     Nitrite, UA neg     Leukocytes, UA Negative    POCT UA - MICROSCOPIC ONLY      Result Value Range   WBC, Ur, HPF, POC 1-5     RBC, urine, microscopic neg     Bacteria, U Microscopic neg     Mucus, UA mod     Epithelial cells, urine per micros occ     Crystals, Ur, HPF, POC neg     Casts, Ur, LPF, POC neg     Yeast, UA neg         Assessment & Plan:  1. Annual physical exam  - NMR Lipoprofile with Lipids - Vitamin D 25 hydroxy - Thyroid Panel With TSH - PSA - POCT urinalysis dipstick - POCT UA - Microscopic Only  2. HTN (hypertension) Low NA+ diet - NMR Lipoprofile with Lipids - COMPLETE METABOLIC PANEL WITH GFR - POCT UA - Microalbumin - glipiZIDE (GLUCOTROL XL) 10 MG 24 hr tablet; Take 1 tablet (10 mg total) by mouth daily.  Dispense: 30 tablet; Refill: 5 - lisinopril-hydrochlorothiazide (PRINZIDE,ZESTORETIC) 20-12.5 MG per tablet; Take 1 tablet by mouth daily.  Dispense: 30 tablet; Refill: 5  3. Other and unspecified hyperlipidemia Low fat diet and exercise Continue creator ans zetia as rx - NMR Lipoprofile with Lipids - POCT UA - Microalbumin - fenofibrate 160 MG tablet; Take 1 tablet (160 mg total) by mouth daily.  Dispense: 30 tablet; Refill: 5  4. Diabetes Count carbs  Recheck in 1 month - POCT glycosylated hemoglobin (Hb A1C)  5. Fatigue Labs  pending - Testosterone, Total & Free Direct Mary-Margaret Daphine Deutscher, FNP

## 2013-02-02 NOTE — Addendum Note (Signed)
Addended by: Bennie Pierini on: 02/02/2013 09:16 AM   Modules accepted: Level of Service

## 2013-02-02 NOTE — Addendum Note (Signed)
Addended by: Prescott Gum on: 02/02/2013 09:24 AM   Modules accepted: Orders

## 2013-02-03 LAB — TESTOSTERONE, TOTAL AND FREE DIRECT MEASURE: Testosterone: 199 ng/dL — ABNORMAL LOW (ref 300–890)

## 2013-02-03 NOTE — Telephone Encounter (Signed)
PT WILL CONTACT PHARMACY AND HAVE THEM SEND REQUEST FOR REFILL.

## 2013-02-04 LAB — NMR LIPOPROFILE WITH LIPIDS
HDL Size: 7.9 nm — ABNORMAL LOW (ref >=9.2)
HDL-C: 43 mg/dL (ref >=40)
LDL Particle Number: 1924 nmol/L — ABNORMAL HIGH (ref <1000)
LP-IR Score: 81 — ABNORMAL HIGH (ref <=45)
Triglycerides: 164 mg/dL — ABNORMAL HIGH (ref <150)
VLDL Size: 53.2 nm — ABNORMAL HIGH (ref <=46.6)

## 2013-02-05 ENCOUNTER — Telehealth: Payer: Self-pay | Admitting: Nurse Practitioner

## 2013-02-05 ENCOUNTER — Telehealth: Payer: Self-pay | Admitting: *Deleted

## 2013-02-05 MED ORDER — LIFESCAN UNISTIK 2 MISC: 1.0000 | Freq: Every day | Status: DC

## 2013-02-05 NOTE — Telephone Encounter (Signed)
Ins co will not cover Accu-/chek Aviva without a prior authorization they prefer bayer lifescan, I called pt and it is fine with them to change but pharmacy says they need a new rx won't take a verbal from me. Can you fic this for [place]?  Thanks!

## 2013-02-09 ENCOUNTER — Other Ambulatory Visit: Payer: Self-pay | Admitting: Nurse Practitioner

## 2013-02-09 NOTE — Telephone Encounter (Signed)
[facility] only find lifescan lancets- can't find glucose strips

## 2013-02-14 ENCOUNTER — Telehealth: Payer: Self-pay | Admitting: *Deleted

## 2013-02-14 MED ORDER — GLUCOSE BLOOD VI STRP: ORAL_STRIP | Status: DC

## 2013-02-14 NOTE — Telephone Encounter (Signed)
Patient aware rx sent to pharmacy.  

## 2013-02-14 NOTE — Telephone Encounter (Signed)
rxc sent to [facility] pharmacy

## 2013-02-14 NOTE — Telephone Encounter (Signed)
Needs rx for one touch ultra test strips. He is good on lancets

## 2013-02-18 NOTE — Telephone Encounter (Signed)
Notified pt and they can get machine and quik strips at [facility], this was acceptable with them.

## 2013-03-04 ENCOUNTER — Other Ambulatory Visit: Payer: Self-pay | Admitting: Nurse Practitioner

## 2013-03-13 ENCOUNTER — Telehealth: Payer: Self-pay | Admitting: Nurse Practitioner

## 2013-03-13 DIAGNOSIS — E785 Hyperlipidemia, unspecified: Secondary | ICD-10-CM

## 2013-03-13 DIAGNOSIS — I10 Essential (primary) hypertension: Secondary | ICD-10-CM

## 2013-03-13 NOTE — Telephone Encounter (Signed)
Orders in computer- need to do Monday or Tuesday so results will be back for appointment

## 2013-03-17 ENCOUNTER — Telehealth: Payer: Self-pay | Admitting: Nurse Practitioner

## 2013-03-18 NOTE — Telephone Encounter (Signed)
NUMBER NOT WORKING BUT LAB ORDER IS IN

## 2013-03-18 NOTE — Telephone Encounter (Signed)
I put order in there last week for him to have done yesterday- order is still good until Friday however the results will not be back by Friday if hasn't had done yet.

## 2013-03-20 ENCOUNTER — Encounter: Payer: Self-pay | Admitting: Nurse Practitioner

## 2013-03-20 ENCOUNTER — Ambulatory Visit (INDEPENDENT_AMBULATORY_CARE_PROVIDER_SITE_OTHER): Payer: BC Managed Care – PPO | Admitting: Nurse Practitioner

## 2013-03-20 VITALS — BP 122/72 | HR 76 | Temp 97.1°F | Ht 68.0 in | Wt 200.0 lb

## 2013-03-20 LAB — POCT GLYCOSYLATED HEMOGLOBIN (HGB A1C): Hemoglobin A1C: 7.5

## 2013-03-20 NOTE — Patient Instructions (Signed)

## 2013-03-20 NOTE — Progress Notes (Signed)
  Subjective:    Patient ID: Philip Huang, male    DOB: 1950-09-14, 62 y.o.   MRN: [id number]  HPI  Patient here today for recheck of diabetes. HgbA1c was 9.2%- added glipizide to janumet he was already taking - blood sugars have been running around 90-120 fatsing- trying to watch diet- no exercise.    Review of Systems  All other systems reviewed and are negative.       Objective:   Physical Exam  Constitutional: He is oriented to person, place, and time. He appears well-developed and well-nourished.  HENT:  Head: Normocephalic.  Right Ear: External ear normal.  Left Ear: External ear normal.  Nose: Nose normal.  Mouth/Throat: Oropharynx is clear and moist.  Eyes: EOM are normal. Pupils are equal, round, and reactive to light.  Neck: Normal range of motion. Neck supple. No thyromegaly present.  Cardiovascular: Normal rate, regular rhythm, normal heart sounds and intact distal pulses.   No murmur heard. Pulmonary/Chest: Effort normal and breath sounds normal. He has no wheezes. He has no rales.  Abdominal: Soft. Bowel sounds are normal.  Musculoskeletal: Normal range of motion.  Neurological: He is alert and oriented to person, place, and time.  Skin: Skin is warm and dry.  Psychiatric: He has a normal mood and affect. His behavior is normal. Judgment and thought content normal.    BP 122/72  Pulse 76  Temp(Src) 97.1 F (36.2 C) (Oral)  Ht 5\' 8"  (1.727 m)  Wt 200 lb (90.719 kg)  BMI 30.42 kg/m2  Results for orders placed in visit on 03/20/13  POCT GLYCOSYLATED HEMOGLOBIN (HGB A1C)      Result Value Range   Hemoglobin A1C 7.5%         Assessment & Plan:  1. Type II or unspecified type diabetes mellitus without mention of complication, uncontrolled Continue current meds Count cars- exercsie at least 3x a week Follow up in 3 months  Mary-Margaret Daphine Deutscher, FNP  - POCT glycosylated hemoglobin (Hb A1C)

## 2013-03-29 ENCOUNTER — Encounter: Payer: Self-pay | Admitting: Nurse Practitioner

## 2013-03-30 ENCOUNTER — Telehealth: Payer: Self-pay | Admitting: Nurse Practitioner

## 2013-03-30 NOTE — Telephone Encounter (Signed)
Needs janumet 50/1000, crestor 10 and zetia 10 samples please.Philip Daphine Deutscher, FNP

## 2013-05-05 ENCOUNTER — Encounter: Payer: Self-pay | Admitting: Family Medicine

## 2013-05-05 ENCOUNTER — Telehealth: Payer: Self-pay | Admitting: Nurse Practitioner

## 2013-05-05 NOTE — Telephone Encounter (Signed)
Error

## 2013-05-07 ENCOUNTER — Telehealth: Payer: Self-pay | Admitting: Nurse Practitioner

## 2013-05-07 NOTE — Telephone Encounter (Signed)
UP FRONT

## 2013-05-21 ENCOUNTER — Telehealth: Payer: Self-pay | Admitting: Nurse Practitioner

## 2013-05-21 MED ORDER — SITAGLIPTIN PHOS-METFORMIN HCL 50-1000 MG PO TABS: 1.0000 | ORAL_TABLET | Freq: Two times a day (BID) | ORAL | Status: DC

## 2013-05-21 MED ORDER — ROSUVASTATIN CALCIUM 10 MG PO TABS: 20.0000 mg | ORAL_TABLET | Freq: Every day | ORAL | Status: DC

## 2013-05-21 NOTE — Telephone Encounter (Signed)
done

## 2013-05-27 ENCOUNTER — Telehealth: Payer: Self-pay | Admitting: Nurse Practitioner

## 2013-05-28 NOTE — Telephone Encounter (Signed)
Patient aware no samples  

## 2013-06-11 ENCOUNTER — Other Ambulatory Visit: Payer: Self-pay

## 2013-06-16 ENCOUNTER — Telehealth: Payer: Self-pay | Admitting: *Deleted

## 2013-06-16 MED ORDER — SAXAGLIPTIN-METFORMIN ER 5-1000 MG PO TB24: 1.0000 | ORAL_TABLET | Freq: Every day | ORAL | Status: DC

## 2013-06-16 NOTE — Telephone Encounter (Signed)
Ins co denied janumet saying kombiglyze must be tried and failed first before janumet would even be considered.

## 2013-06-16 NOTE — Telephone Encounter (Signed)
kombyglize rx sent to pharamacy in place of janumet

## 2013-06-29 ENCOUNTER — Telehealth: Payer: Self-pay | Admitting: Nurse Practitioner

## 2013-06-29 NOTE — Telephone Encounter (Signed)
Spoke with pharmacy

## 2013-06-29 NOTE — Telephone Encounter (Signed)
Can we give samples of janumet and crestor please

## 2013-06-29 NOTE — Telephone Encounter (Signed)
PT INSURANCE NOT COVER MEDS UNTIL MEETS DEDUCTIBLE. CAN YOU SWITCH CRESTOR AND JANUMET TO SOMETHING CHEAPER? HAS APPT 07/27/13 WITH YOU. CALL WIFE BACK.

## 2013-06-30 NOTE — Telephone Encounter (Signed)
Patients wife aware samples up front

## 2013-06-30 NOTE — Telephone Encounter (Signed)
Spoke with pharmacy and patients wife

## 2013-07-10 ENCOUNTER — Encounter: Payer: Self-pay | Admitting: Nurse Practitioner

## 2013-07-10 ENCOUNTER — Ambulatory Visit (INDEPENDENT_AMBULATORY_CARE_PROVIDER_SITE_OTHER): Payer: 59 | Admitting: Nurse Practitioner

## 2013-07-10 VITALS — BP 110/66 | HR 60 | Temp 97.8°F | Ht 68.0 in | Wt 198.0 lb

## 2013-07-10 DIAGNOSIS — E785 Hyperlipidemia, unspecified: Secondary | ICD-10-CM

## 2013-07-10 DIAGNOSIS — I10 Essential (primary) hypertension: Secondary | ICD-10-CM

## 2013-07-10 DIAGNOSIS — E119 Type 2 diabetes mellitus without complications: Secondary | ICD-10-CM

## 2013-07-10 MED ORDER — LISINOPRIL-HYDROCHLOROTHIAZIDE 20-12.5 MG PO TABS: 1.0000 | ORAL_TABLET | Freq: Every day | ORAL | Status: DC

## 2013-07-10 MED ORDER — ATORVASTATIN CALCIUM 40 MG PO TABS: 40.0000 mg | ORAL_TABLET | Freq: Every day | ORAL | Status: DC

## 2013-07-10 MED ORDER — GLIPIZIDE ER 10 MG PO TB24: 10.0000 mg | ORAL_TABLET | Freq: Every day | ORAL | Status: DC

## 2013-07-10 NOTE — Progress Notes (Signed)
Subjective:    Patient ID: Philip Huang, male    DOB: [birth year], 62 y.o.   MRN: [id number]  Hypertension This is a chronic problem. The current episode started more than 1 year ago. The problem is unchanged. The problem is controlled. Pertinent negatives include no blurred vision, headaches, neck pain, palpitations, peripheral edema, shortness of breath or sweats. There are no associated agents to hypertension. Risk factors for coronary artery disease include dyslipidemia, diabetes mellitus, family history and male gender. Past treatments include ACE inhibitors and diuretics. The current treatment provides significant improvement. Compliance problems include diet and exercise.   Hyperlipidemia This is a chronic problem. The current episode started more than 1 year ago. The problem is uncontrolled. Recent lipid tests were reviewed and are high. Exacerbating diseases include diabetes and obesity. He has no history of hypothyroidism. Pertinent negatives include no shortness of breath. Current antihyperlipidemic treatment includes statins, ezetimibe and fibric acid derivatives. The current treatment provides moderate improvement of lipids. Compliance problems include adherence to diet and adherence to exercise.  Risk factors for coronary artery disease include dyslipidemia, hypertension and male sex.  Diabetes He presents for his follow-up diabetic visit. He has type 2 diabetes mellitus. No MedicAlert identification noted. His disease course has been stable. Pertinent negatives for hypoglycemia include no headaches or sweats. Pertinent negatives for diabetes include no blurred vision. Risk factors for coronary artery disease include dyslipidemia, family history, male sex and hypertension. Current diabetic treatment includes oral agent (triple therapy). He is compliant with treatment most of the time. His weight is stable. He is following a diabetic diet. When asked about meal planning, he reported none.  He has not had a previous visit with a dietician. He rarely participates in exercise. His breakfast blood glucose is taken between 7-8 am. His breakfast blood glucose range is generally 110-130 mg/dl. His overall blood glucose range is 110-130 mg/dl. An ACE inhibitor/angiotensin II receptor blocker is being taken. He does not see a podiatrist.Eye exam is current.      Review of Systems  Eyes: Negative for blurred vision.  Respiratory: Negative for shortness of breath.   Cardiovascular: Negative for palpitations.  Musculoskeletal: Negative for neck pain.  Neurological: Negative for headaches.  All other systems reviewed and are negative.       Objective:   Physical Exam  Constitutional: He is oriented to person, place, and time. He appears well-developed and well-nourished.  HENT:  Head: Normocephalic.  Right Ear: External ear normal.  Left Ear: External ear normal.  Nose: Nose normal.  Mouth/Throat: Oropharynx is clear and moist.  Eyes: EOM are normal. Pupils are equal, round, and reactive to light.  Neck: Normal range of motion. Neck supple. No JVD present. No thyromegaly present.  Cardiovascular: Normal rate, regular rhythm, normal heart sounds and intact distal pulses.  Exam reveals no gallop and no friction rub.   No murmur heard. Pulmonary/Chest: Effort normal and breath sounds normal. No respiratory distress. He has no wheezes. He has no rales. He exhibits no tenderness.  Abdominal: Soft. Bowel sounds are normal. He exhibits no mass. There is no tenderness.  Genitourinary: Penis normal.  Musculoskeletal: Normal range of motion. He exhibits no edema.  Lymphadenopathy:    He has no cervical adenopathy.  Neurological: He is alert and oriented to person, place, and time. No cranial nerve deficit.  Skin: Skin is warm and dry.  Psychiatric: He has a normal mood and affect. His behavior is normal. Judgment and thought content normal.  BP 110/66  Pulse 60  Temp(Src) 97.8 F  (36.6 C) (Oral)  Ht 5\' 8"  (1.727 m)  Wt 198 lb (89.812 kg)  BMI 30.11 kg/m2  Results for orders placed in visit on 07/10/13  POCT GLYCOSYLATED HEMOGLOBIN (HGB A1C)      Result Value Range   Hemoglobin A1C 7.1          Assessment & Plan:   1. Hyperlipidemia   2. Hypertension   3. Type II diabetes mellitus   4. HTN (hypertension)    Orders Placed This Encounter  Procedures  . CMP14+EGFR  . NMR, lipoprofile  . POCT glycosylated hemoglobin (Hb A1C)   Meds ordered this encounter  Medications  . sitaGLIPtin-metformin (JANUMET) 50-1000 MG per tablet    Sig: Take 1 tablet by mouth 2 (two) times daily with a meal.  . DISCONTD: doxycycline (VIBRAMYCIN) 100 MG capsule    Sig: Take 100 mg by mouth 2 (two) times daily.  Marland Kitchen DISCONTD: metFORMIN (GLUCOPHAGE) 500 MG tablet    Sig:   . glipiZIDE (GLUCOTROL XL) 10 MG 24 hr tablet    Sig: Take 1 tablet (10 mg total) by mouth daily.    Dispense:  30 tablet    Refill:  5    Order Specific Question:  Supervising Provider    Answer:  Ernestina Penna [[id number]]  . DISCONTD: lisinopril-hydrochlorothiazide (PRINZIDE,ZESTORETIC) 20-12.5 MG per tablet    Sig: Take 1 tablet by mouth daily.    Dispense:  30 tablet    Refill:  5    Order Specific Question:  Supervising Provider    Answer:  Ernestina Penna [[id number]]  . atorvastatin (LIPITOR) 40 MG tablet    Sig: Take 1 tablet (40 mg total) by mouth daily.    Dispense:  30 tablet    Refill:  5    Order Specific Question:  Supervising Provider    Answer:  Ernestina Penna [[id number]]  . lisinopril-hydrochlorothiazide (PRINZIDE,ZESTORETIC) 20-12.5 MG per tablet    Sig: Take 1 tablet by mouth daily.    Dispense:  30 tablet    Refill:  5    Order Specific Question:  Supervising Provider    Answer:  Ernestina Penna [[id number]]   Changed crestor to lipitor due to expense Continue all meds Labs pending Diet and exercise encouraged Health maintenance reviewed Follow up in 3 months Flu shot Hemoccult  cards Mary-Margaret Daphine Deutscher, FNP

## 2013-07-10 NOTE — Patient Instructions (Signed)

## 2013-07-12 LAB — NMR, LIPOPROFILE
Cholesterol: 147 mg/dL (ref <200)
HDL Cholesterol by NMR: 40 mg/dL (ref >=40)
LDL Particle Number: 1553 nmol/L — ABNORMAL HIGH (ref <1000)
LDL Size: 19.9 nm — ABNORMAL LOW (ref >20.5)
LP-IR Score: 86 — ABNORMAL HIGH (ref <=45)
Small LDL Particle Number: 1075 nmol/L — ABNORMAL HIGH (ref <=527)
Triglycerides by NMR: 117 mg/dL (ref <150)

## 2013-07-12 LAB — CMP14+EGFR
ALT: 36 IU/L (ref 0–44)
Albumin/Globulin Ratio: 2.2 (ref 1.1–2.5)
Alkaline Phosphatase: 41 IU/L (ref 39–117)
BUN/Creatinine Ratio: 17 (ref 10–22)
Calcium: 9.6 mg/dL (ref 8.6–10.2)
Chloride: 100 mmol/L (ref 97–108)
GFR calc Af Amer: 77 mL/min/{1.73_m2} (ref >59)
GFR calc non Af Amer: 67 mL/min/{1.73_m2} (ref >59)
Glucose: 122 mg/dL — ABNORMAL HIGH (ref 65–99)
Potassium: 4.3 mmol/L (ref 3.5–5.2)
Total Bilirubin: 0.4 mg/dL (ref 0.0–1.2)
Total Protein: 6.7 g/dL (ref 6.0–8.5)

## 2013-07-27 ENCOUNTER — Ambulatory Visit: Payer: BC Managed Care – PPO | Admitting: Nurse Practitioner

## 2013-08-24 ENCOUNTER — Other Ambulatory Visit: Payer: Self-pay | Admitting: Nurse Practitioner

## 2013-10-18 ENCOUNTER — Encounter: Payer: Self-pay | Admitting: Family Medicine

## 2013-10-28 ENCOUNTER — Ambulatory Visit (INDEPENDENT_AMBULATORY_CARE_PROVIDER_SITE_OTHER): Payer: 59 | Admitting: Nurse Practitioner

## 2013-10-28 ENCOUNTER — Encounter: Payer: Self-pay | Admitting: Nurse Practitioner

## 2013-10-28 VITALS — BP 134/82 | HR 68 | Temp 98.6°F | Ht 68.0 in | Wt 203.4 lb

## 2013-10-28 DIAGNOSIS — E119 Type 2 diabetes mellitus without complications: Secondary | ICD-10-CM

## 2013-10-28 DIAGNOSIS — I10 Essential (primary) hypertension: Secondary | ICD-10-CM

## 2013-10-28 DIAGNOSIS — Z23 Encounter for immunization: Secondary | ICD-10-CM

## 2013-10-28 DIAGNOSIS — E785 Hyperlipidemia, unspecified: Secondary | ICD-10-CM

## 2013-10-28 LAB — POCT GLYCOSYLATED HEMOGLOBIN (HGB A1C)

## 2013-10-28 MED ORDER — SITAGLIPTIN PHOS-METFORMIN HCL 50-1000 MG PO TABS: 1.0000 | ORAL_TABLET | Freq: Two times a day (BID) | ORAL | Status: DC

## 2013-10-28 NOTE — Progress Notes (Signed)
 Subjective:    Patient ID: Philip Huang, male    DOB: 09/24/1950, 63 y.o.   MRN: [id number]  Hypertension This is a chronic problem. The current episode started more than 1 year ago. The problem is unchanged. The problem is controlled. Pertinent negatives include no blurred vision, headaches, neck pain, palpitations, peripheral edema, shortness of breath or sweats. There are no associated agents to hypertension. Risk factors for coronary artery disease include dyslipidemia, diabetes mellitus, family history and male gender. Past treatments include ACE inhibitors and diuretics. The current treatment provides significant improvement. Compliance problems include diet and exercise.   Hyperlipidemia This is a chronic problem. The current episode started more than 1 year ago. The problem is uncontrolled. Recent lipid tests were reviewed and are high. Exacerbating diseases include diabetes and obesity. He has no history of hypothyroidism. Pertinent negatives include no shortness of breath. Current antihyperlipidemic treatment includes statins, ezetimibe and fibric acid derivatives. The current treatment provides moderate improvement of lipids. Compliance problems include adherence to diet and adherence to exercise.  Risk factors for coronary artery disease include dyslipidemia, hypertension and male sex.  Diabetes He presents for his follow-up diabetic visit. He has type 2 diabetes mellitus. No MedicAlert identification noted. His disease course has been stable. Pertinent negatives for hypoglycemia include no headaches or sweats. Pertinent negatives for diabetes include no blurred vision. Risk factors for coronary artery disease include dyslipidemia, family history, male sex and hypertension. Current diabetic treatment includes oral agent (triple therapy) (wife accidently stopped giviing him his Janumet so blood sugars have ben high.). He is compliant with treatment most of the time. His weight is stable. He  is following a diabetic diet. When asked about meal planning, he reported none. He has not had a previous visit with a dietician. He rarely participates in exercise. His breakfast blood glucose is taken between 7-8 am. His breakfast blood glucose range is generally 140-180 mg/dl. His overall blood glucose range is 110-130 mg/dl. An ACE inhibitor/angiotensin II receptor blocker is being taken. He does not see a podiatrist.Eye exam is current.      Review of Systems  Eyes: Negative for blurred vision.  Respiratory: Negative for shortness of breath.   Cardiovascular: Negative for palpitations.  Musculoskeletal: Negative for neck pain.  Neurological: Negative for headaches.  All other systems reviewed and are negative.       Objective:   Physical Exam  Constitutional: He is oriented to person, place, and time. He appears well-developed and well-nourished.  HENT:  Head: Normocephalic.  Right Ear: External ear normal.  Left Ear: External ear normal.  Nose: Nose normal.  Mouth/Throat: Oropharynx is clear and moist.  Eyes: EOM are normal. Pupils are equal, round, and reactive to light.  Neck: Normal range of motion. Neck supple. No JVD present. No thyromegaly present.  Cardiovascular: Normal rate, regular rhythm, normal heart sounds and intact distal pulses.  Exam reveals no gallop and no friction rub.   No murmur heard. Pulmonary/Chest: Effort normal and breath sounds normal. No respiratory distress. He has no wheezes. He has no rales. He exhibits no tenderness.  Abdominal: Soft. Bowel sounds are normal. He exhibits no mass. There is no tenderness.  Genitourinary: Penis normal.  Musculoskeletal: Normal range of motion. He exhibits no edema.  Lymphadenopathy:    He has no cervical adenopathy.  Neurological: He is alert and oriented to person, place, and time. No cranial nerve deficit.  Skin: Skin is warm and dry.  Psychiatric: He has a normal   mood and affect. His behavior is normal.  Judgment and thought content normal.    BP 134/82  Pulse 68  Temp(Src) 98.6 F (37 C) (Oral)  Ht 5' 8" (1.727 m)  Wt 203 lb 6.4 oz (92.262 kg)  BMI 30.93 kg/m2  Results for orders placed in visit on 10/28/13  POCT GLYCOSYLATED HEMOGLOBIN (HGB A1C)      Result Value Ref Range   Hemoglobin A1C 8.9%          Assessment & Plan:   1. Hyperlipidemia   2. Hypertension   3. Type II diabetes mellitus   4. Need for Zostavax administration    Orders Placed This Encounter  Procedures  . Varicella-zoster vaccine subcutaneous  . CMP14+EGFR  . NMR, lipoprofile  . POCT glycosylated hemoglobin (Hb A1C)   Meds ordered this encounter  Medications  . DISCONTD: sitaGLIPtin-metformin (JANUMET) 50-1000 MG per tablet    Sig: Take 1 tablet by mouth 2 (two) times daily with a meal.    Dispense:  60 tablet    Refill:  0    Order Specific Question:  Supervising Provider    Answer:  MOORE, DONALD W [[id number]]  . DISCONTD: sitaGLIPtin-metformin (JANUMET) 50-1000 MG per tablet    Sig: Take 1 tablet by mouth 2 (two) times daily with a meal.    Dispense:  60 tablet    Refill:  5    Order Specific Question:  Supervising Provider    Answer:  MOORE, DONALD W [[id number]]  . sitaGLIPtin-metformin (JANUMET) 50-1000 MG per tablet    Sig: Take 1 tablet by mouth 2 (two) times daily with a meal.    Dispense:  60 tablet    Refill:  0    Order Specific Question:  Supervising Provider    Answer:  MOORE, DONALD W [[id number]]  shingles vaccine today Please get back on janumet 50 /1000 BID Blood sugar diary Labs pending Health maintenance reviewed Diet and exercise encouraged Continue all meds Follow up  In 3 month   Mary-Margaret Martin, FNP    

## 2013-10-28 NOTE — Patient Instructions (Signed)

## 2013-10-30 LAB — CMP14+EGFR
ALK PHOS: 71 IU/L (ref 39–117)
ALT: 40 IU/L (ref 0–44)
AST: 26 IU/L (ref 0–40)
Albumin/Globulin Ratio: 1.8 (ref 1.1–2.5)
Albumin: 4.7 g/dL (ref 3.6–4.8)
BILIRUBIN TOTAL: 0.3 mg/dL (ref 0.0–1.2)
BUN / CREAT RATIO: 17 (ref 10–22)
BUN: 18 mg/dL (ref 8–27)
CO2: 25 mmol/L (ref 18–29)
CREATININE: 1.09 mg/dL (ref 0.76–1.27)
Calcium: 9.7 mg/dL (ref 8.6–10.2)
Chloride: 99 mmol/L (ref 97–108)
GFR, EST AFRICAN AMERICAN: 84 mL/min/{1.73_m2} (ref >59)
GFR, EST NON AFRICAN AMERICAN: 73 mL/min/{1.73_m2} (ref >59)
GLOBULIN, TOTAL: 2.6 g/dL (ref 1.5–4.5)
Glucose: 195 mg/dL — ABNORMAL HIGH (ref 65–99)
Potassium: 4 mmol/L (ref 3.5–5.2)
SODIUM: 142 mmol/L (ref 134–144)
Total Protein: 7.3 g/dL (ref 6.0–8.5)

## 2013-10-30 LAB — NMR, LIPOPROFILE
Cholesterol: 198 mg/dL (ref <200)
HDL Cholesterol by NMR: 38 mg/dL — ABNORMAL LOW (ref >=40)
HDL PARTICLE NUMBER: 30.5 umol/L (ref >=30.5)
LDL Particle Number: 2013 nmol/L — ABNORMAL HIGH (ref <1000)
LDL Size: 20 nm — ABNORMAL LOW (ref >20.5)
LDLC SERPL CALC-MCNC: 87 mg/dL (ref <100)
LP-IR SCORE: 95 — AB (ref <=45)
SMALL LDL PARTICLE NUMBER: 1408 nmol/L — AB (ref <=527)
Triglycerides by NMR: 363 mg/dL — ABNORMAL HIGH (ref <150)

## 2013-11-01 ENCOUNTER — Encounter: Payer: Self-pay | Admitting: Family Medicine

## 2013-11-02 ENCOUNTER — Other Ambulatory Visit: Payer: Self-pay | Admitting: Nurse Practitioner

## 2013-11-02 MED ORDER — ROSUVASTATIN CALCIUM 20 MG PO TABS: 20.0000 mg | ORAL_TABLET | Freq: Every day | ORAL | Status: DC

## 2013-11-02 NOTE — Telephone Encounter (Signed)
They are in agreement to restarting crestor

## 2013-11-03 ENCOUNTER — Telehealth: Payer: Self-pay | Admitting: Nurse Practitioner

## 2013-11-04 ENCOUNTER — Telehealth: Payer: Self-pay | Admitting: *Deleted

## 2013-11-04 MED ORDER — LINAGLIPTIN-METFORMIN HCL 2.5-1000 MG PO TABS: 1.0000 | ORAL_TABLET | Freq: Two times a day (BID) | ORAL | Status: DC

## 2013-11-04 NOTE — Telephone Encounter (Signed)
Ins co will not cover janumet until pt has tried and failed all of the following:  1. Jentaduento, 2. Kombyglyze, or 3.  [place].  Will any of these work? Thanks for your help.

## 2013-11-04 NOTE — Telephone Encounter (Signed)
Changed janumet to [employer] which will work th [employer] will pay for this- let me know if blood sugars go up.

## 2013-11-12 ENCOUNTER — Telehealth: Payer: Self-pay | Admitting: Nurse Practitioner

## 2013-11-12 MED ORDER — ROSUVASTATIN CALCIUM 20 MG PO TABS: 20.0000 mg | ORAL_TABLET | Freq: Every day | ORAL | Status: DC

## 2013-11-12 MED ORDER — SITAGLIPTIN PHOS-METFORMIN HCL 50-1000 MG PO TABS: 1.0000 | ORAL_TABLET | Freq: Two times a day (BID) | ORAL | Status: DC

## 2013-11-12 NOTE — Telephone Encounter (Signed)
Wants rx for janumet to send to "[facility]" and wants crestor rx to send there as well. copay is 300.00 janumet  and cannot afford it  [phone] call this number when rx ready.

## 2013-11-12 NOTE — Telephone Encounter (Signed)
rx ready for pickup 

## 2013-11-13 ENCOUNTER — Encounter: Payer: Self-pay | Admitting: Family Medicine

## 2013-11-13 ENCOUNTER — Other Ambulatory Visit: Payer: Self-pay

## 2013-11-13 ENCOUNTER — Other Ambulatory Visit: Payer: Self-pay | Admitting: Nurse Practitioner

## 2013-11-13 MED ORDER — ROSUVASTATIN CALCIUM 20 MG PO TABS
20.0000 mg | ORAL_TABLET | Freq: Every day | ORAL | Status: DC
Start: 2013-11-13 — End: 2014-10-22

## 2013-11-20 NOTE — Telephone Encounter (Signed)
See note on 11-20-13

## 2013-11-20 NOTE — Telephone Encounter (Signed)
Talked with pts wife Hope and she is going to come by and pick up a jentadueto savings card and see if it is affordable for them if not she will let us know and will continue to try to get janumet from [place].

## 2013-11-20 NOTE — Telephone Encounter (Signed)
See note of 11-20-13

## 2013-12-09 LAB — FECAL OCCULT BLOOD, IMMUNOCHEMICAL: Fecal Occult Bld: NEGATIVE

## 2013-12-31 ENCOUNTER — Other Ambulatory Visit: Payer: Self-pay | Admitting: Nurse Practitioner

## 2013-12-31 MED ORDER — METFORMIN HCL 1000 MG PO TABS: 1000.0000 mg | ORAL_TABLET | Freq: Two times a day (BID) | ORAL | Status: DC

## 2014-01-28 ENCOUNTER — Other Ambulatory Visit: Payer: Self-pay | Admitting: Nurse Practitioner

## 2014-01-29 ENCOUNTER — Other Ambulatory Visit: Payer: Self-pay | Admitting: *Deleted

## 2014-01-29 MED ORDER — FENOFIBRATE 160 MG PO TABS: ORAL_TABLET | ORAL | Status: DC

## 2014-01-29 NOTE — Telephone Encounter (Signed)
Last seen 10/28/13 MMM  Last lipid 07/10/13

## 2014-02-01 ENCOUNTER — Other Ambulatory Visit: Payer: Self-pay | Admitting: Nurse Practitioner

## 2014-02-09 ENCOUNTER — Other Ambulatory Visit: Payer: Self-pay | Admitting: Nurse Practitioner

## 2014-03-02 ENCOUNTER — Ambulatory Visit: Payer: 59 | Admitting: Nurse Practitioner

## 2014-03-12 ENCOUNTER — Ambulatory Visit (INDEPENDENT_AMBULATORY_CARE_PROVIDER_SITE_OTHER): Payer: 59 | Admitting: Nurse Practitioner

## 2014-03-12 ENCOUNTER — Encounter: Payer: Self-pay | Admitting: Nurse Practitioner

## 2014-03-12 VITALS — BP 124/70 | HR 69 | Temp 98.2°F | Ht 68.0 in | Wt 201.6 lb

## 2014-03-12 DIAGNOSIS — E663 Overweight: Secondary | ICD-10-CM

## 2014-03-12 DIAGNOSIS — R21 Rash and other nonspecific skin eruption: Secondary | ICD-10-CM

## 2014-03-12 DIAGNOSIS — R635 Abnormal weight gain: Secondary | ICD-10-CM

## 2014-03-12 DIAGNOSIS — Z713 Dietary counseling and surveillance: Secondary | ICD-10-CM

## 2014-03-12 DIAGNOSIS — I158 Other secondary hypertension: Secondary | ICD-10-CM

## 2014-03-12 DIAGNOSIS — E785 Hyperlipidemia, unspecified: Secondary | ICD-10-CM

## 2014-03-12 DIAGNOSIS — E119 Type 2 diabetes mellitus without complications: Secondary | ICD-10-CM

## 2014-03-12 DIAGNOSIS — Z6830 Body mass index (BMI) 30.0-30.9, adult: Secondary | ICD-10-CM

## 2014-03-12 DIAGNOSIS — I159 Secondary hypertension, unspecified: Secondary | ICD-10-CM

## 2014-03-12 LAB — POCT GLYCOSYLATED HEMOGLOBIN (HGB A1C): Hemoglobin A1C: 7.5

## 2014-03-12 MED ORDER — TRIAMCINOLONE ACETONIDE 0.1 % EX CREA: 1.0000 "application " | TOPICAL_CREAM | Freq: Two times a day (BID) | CUTANEOUS | Status: DC

## 2014-03-12 NOTE — Patient Instructions (Signed)

## 2014-03-12 NOTE — Progress Notes (Signed)
Subjective:    Patient ID: Rylend Pietrzak, male    DOB: 01-22-1951, 63 y.o.   MRN: [id number]  Patient here today for follow up of chronic medical problems.Hgba1c was elevated last time and he has been working hard to watch diet and is doing much better.   Hypertension This is a chronic problem. The current episode started more than 1 year ago. The problem is unchanged. The problem is controlled. Pertinent negatives include no blurred vision, headaches, neck pain, palpitations, peripheral edema, shortness of breath or sweats. There are no associated agents to hypertension. Risk factors for coronary artery disease include dyslipidemia, diabetes mellitus, family history and male gender. Past treatments include ACE inhibitors and diuretics. The current treatment provides significant improvement. Compliance problems include diet and exercise.   Hyperlipidemia This is a chronic problem. The current episode started more than 1 year ago. The problem is uncontrolled. Recent lipid tests were reviewed and are high. Exacerbating diseases include diabetes and obesity. He has no history of hypothyroidism. Pertinent negatives include no shortness of breath. Current antihyperlipidemic treatment includes statins, ezetimibe and fibric acid derivatives. The current treatment provides moderate improvement of lipids. Compliance problems include adherence to diet and adherence to exercise.  Risk factors for coronary artery disease include dyslipidemia, hypertension and male sex.  Diabetes He presents for his follow-up diabetic visit. He has type 2 diabetes mellitus. No MedicAlert identification noted. His disease course has been stable. Pertinent negatives for hypoglycemia include no headaches or sweats. Pertinent negatives for diabetes include no blurred vision. Risk factors for coronary artery disease include dyslipidemia, family history, male sex and hypertension. Current diabetic treatment includes oral agent (triple  therapy) (wife accidently stopped giviing him his Janumet so blood sugars have ben high.). He is compliant with treatment most of the time. His weight is stable. He is following a diabetic diet. When asked about meal planning, he reported none. He has not had a previous visit with a dietician. He rarely participates in exercise. His breakfast blood glucose is taken between 7-8 am. His breakfast blood glucose range is generally 140-180 mg/dl. His overall blood glucose range is 110-130 mg/dl. An ACE inhibitor/angiotensin II receptor blocker is being taken. He does not see a podiatrist.Eye exam is current.      Review of Systems  Eyes: Negative for blurred vision.  Respiratory: Negative for shortness of breath.   Cardiovascular: Negative for palpitations.  Musculoskeletal: Negative for neck pain.  Neurological: Negative for headaches.  All other systems reviewed and are negative.      Objective:   Physical Exam  Constitutional: He is oriented to person, place, and time. He appears well-developed and well-nourished.  HENT:  Head: Normocephalic.  Right Ear: External ear normal.  Left Ear: External ear normal.  Nose: Nose normal.  Mouth/Throat: Oropharynx is clear and moist.  Eyes: EOM are normal. Pupils are equal, round, and reactive to light.  Neck: Normal range of motion. Neck supple. No JVD present. No thyromegaly present.  Cardiovascular: Normal rate, regular rhythm, normal heart sounds and intact distal pulses.  Exam reveals no gallop and no friction rub.   No murmur heard. Pulmonary/Chest: Effort normal and breath sounds normal. No respiratory distress. He has no wheezes. He has no rales. He exhibits no tenderness.  Abdominal: Soft. Bowel sounds are normal. He exhibits no mass. There is no tenderness.  Genitourinary: Penis normal.  Musculoskeletal: Normal range of motion. He exhibits no edema.  Lymphadenopathy:    He has no cervical adenopathy.  Neurological: He is alert and  oriented to person, place, and time. No cranial nerve deficit.  Skin: Skin is warm and dry.  Erythematous patchy macular rash on abd wall bil  Psychiatric: He has a normal mood and affect. His behavior is normal. Judgment and thought content normal.    BP 124/70  Pulse 69  Temp(Src) 98.2 F (36.8 C) (Oral)  Ht 5' 8"  (1.727 m)  Wt 201 lb 9.6 oz (91.445 kg)  BMI 30.66 kg/m2  Results for orders placed in visit on 03/12/14  POCT GLYCOSYLATED HEMOGLOBIN (HGB A1C)      Result Value Ref Range   Hemoglobin A1C 7.5%          Assessment & Plan:    1. Hyperlipidemia   2. Secondary hypertension, unspecified   3. Type 2 diabetes mellitus without complication   4. Overweight   5. BMI 30.0-30.9,adult   6. Weight loss counseling, encounter for   7. Rash and nonspecific skin eruption     Orders Placed This Encounter  Procedures  . CMP14+EGFR  . NMR, lipoprofile  . POCT glycosylated hemoglobin (Hb A1C)   Meds ordered this encounter  Medications  . triamcinolone cream (KENALOG) 0.1 %    Sig: Apply 1 application topically 2 (two) times daily.    Dispense:  30 g    Refill:  0    Order Specific Question:  Supervising Provider    Answer:  Chipper Herb [[id number]]    Avoid scratching rash- rto if not improving with cream Labs pending Health maintenance reviewed Diet and exercise encouraged Continue all meds Follow up  In 3 months   [place], FNP

## 2014-03-13 LAB — CMP14+EGFR
ALK PHOS: 51 IU/L (ref 39–117)
ALT: 36 IU/L (ref 0–44)
AST: 27 IU/L (ref 0–40)
Albumin/Globulin Ratio: 1.8 (ref 1.1–2.5)
Albumin: 4.6 g/dL (ref 3.6–4.8)
BILIRUBIN TOTAL: 0.3 mg/dL (ref 0.0–1.2)
BUN / CREAT RATIO: 17 (ref 10–22)
BUN: 21 mg/dL (ref 8–27)
CHLORIDE: 99 mmol/L (ref 97–108)
CO2: 25 mmol/L (ref 18–29)
Calcium: 9.7 mg/dL (ref 8.6–10.2)
Creatinine, Ser: 1.24 mg/dL (ref 0.76–1.27)
GFR calc Af Amer: 72 mL/min/{1.73_m2} (ref >59)
GFR calc non Af Amer: 62 mL/min/{1.73_m2} (ref >59)
GLUCOSE: 135 mg/dL — AB (ref 65–99)
Globulin, Total: 2.5 g/dL (ref 1.5–4.5)
POTASSIUM: 4.1 mmol/L (ref 3.5–5.2)
Sodium: 141 mmol/L (ref 134–144)
TOTAL PROTEIN: 7.1 g/dL (ref 6.0–8.5)

## 2014-03-13 LAB — NMR, LIPOPROFILE
Cholesterol: 189 mg/dL (ref 100–199)
HDL Cholesterol by NMR: 37 mg/dL — ABNORMAL LOW (ref >39)
HDL Particle Number: 33.5 umol/L (ref >=30.5)
LDL PARTICLE NUMBER: 1785 nmol/L — AB (ref <1000)
LDL SIZE: 20.1 nm (ref >20.5)
LDLC SERPL CALC-MCNC: 100 mg/dL — ABNORMAL HIGH (ref 0–99)
LP-IR Score: 87 — ABNORMAL HIGH (ref <=45)
Small LDL Particle Number: 1178 nmol/L — ABNORMAL HIGH (ref <=527)
Triglycerides by NMR: 259 mg/dL — ABNORMAL HIGH (ref 0–149)

## 2014-03-17 ENCOUNTER — Other Ambulatory Visit: Payer: Self-pay | Admitting: Nurse Practitioner

## 2014-03-25 ENCOUNTER — Other Ambulatory Visit: Payer: Self-pay | Admitting: Nurse Practitioner

## 2014-03-26 ENCOUNTER — Other Ambulatory Visit: Payer: Self-pay | Admitting: Nurse Practitioner

## 2014-04-09 LAB — HM DIABETES EYE EXAM

## 2014-04-24 ENCOUNTER — Other Ambulatory Visit: Payer: Self-pay | Admitting: Nurse Practitioner

## 2014-05-14 ENCOUNTER — Encounter: Payer: Self-pay | Admitting: Cardiovascular Disease

## 2014-05-25 ENCOUNTER — Telehealth: Payer: Self-pay | Admitting: Nurse Practitioner

## 2014-05-26 MED ORDER — ROSUVASTATIN CALCIUM 40 MG PO TABS: 40.0000 mg | ORAL_TABLET | Freq: Every day | ORAL | Status: DC

## 2014-05-26 NOTE — Telephone Encounter (Signed)
rx changed as requested.

## 2014-05-26 NOTE — Telephone Encounter (Signed)
Aware. 

## 2014-05-30 ENCOUNTER — Other Ambulatory Visit: Payer: Self-pay | Admitting: Nurse Practitioner

## 2014-06-02 ENCOUNTER — Telehealth: Payer: Self-pay | Admitting: Nurse Practitioner

## 2014-06-02 ENCOUNTER — Telehealth: Payer: Self-pay | Admitting: Family Medicine

## 2014-06-03 NOTE — Telephone Encounter (Signed)
Spoke with Philip Huang who states Philip Huang needs f/u appt to check hgb a1c. Advised his appt would need to be after Nov 7 so his insurance would cover. Philip Huang won't be able to be seen until the week of Christmas due to not being able to take any time off work. Philip Huang will CB to schedule his appt on or around the week of Christmas.

## 2014-06-03 NOTE — Telephone Encounter (Signed)
Pt has multiple calls regarding same problem, will close this call and complete.

## 2014-07-18 ENCOUNTER — Other Ambulatory Visit: Payer: Self-pay | Admitting: Nurse Practitioner

## 2014-07-19 NOTE — Telephone Encounter (Signed)
Last ov 8/15. Last AIC 7.5 on 8/15.

## 2014-07-19 NOTE — Telephone Encounter (Signed)
no more refills without being seen  

## 2014-08-18 ENCOUNTER — Telehealth: Payer: Self-pay | Admitting: Nurse Practitioner

## 2014-08-18 NOTE — Telephone Encounter (Signed)
Patient aware that samples aren't available this time. They will check back.

## 2014-08-19 ENCOUNTER — Other Ambulatory Visit: Payer: Self-pay | Admitting: Nurse Practitioner

## 2014-08-23 ENCOUNTER — Telehealth: Payer: Self-pay | Admitting: Nurse Practitioner

## 2014-08-23 NOTE — Telephone Encounter (Signed)
Pt's wife notified of samples Samples to front for pt pick up

## 2014-09-15 ENCOUNTER — Other Ambulatory Visit: Payer: Self-pay | Admitting: Nurse Practitioner

## 2014-10-03 ENCOUNTER — Other Ambulatory Visit: Payer: Self-pay | Admitting: Nurse Practitioner

## 2014-10-15 ENCOUNTER — Other Ambulatory Visit: Payer: Self-pay | Admitting: Nurse Practitioner

## 2014-10-18 ENCOUNTER — Telehealth: Payer: Self-pay | Admitting: Nurse Practitioner

## 2014-10-18 MED ORDER — LISINOPRIL-HYDROCHLOROTHIAZIDE 20-12.5 MG PO TABS: 1.0000 | ORAL_TABLET | Freq: Every day | ORAL | Status: DC

## 2014-10-18 MED ORDER — GLIPIZIDE ER 10 MG PO TB24: 10.0000 mg | ORAL_TABLET | Freq: Every day | ORAL | Status: DC

## 2014-10-18 NOTE — Telephone Encounter (Signed)
done

## 2014-10-22 ENCOUNTER — Encounter: Payer: Self-pay | Admitting: Nurse Practitioner

## 2014-10-22 ENCOUNTER — Ambulatory Visit (INDEPENDENT_AMBULATORY_CARE_PROVIDER_SITE_OTHER): Payer: BLUE CROSS/BLUE SHIELD | Admitting: Nurse Practitioner

## 2014-10-22 VITALS — BP 155/79 | HR 63 | Temp 96.8°F | Ht 69.0 in | Wt 210.0 lb

## 2014-10-22 DIAGNOSIS — E785 Hyperlipidemia, unspecified: Secondary | ICD-10-CM

## 2014-10-22 DIAGNOSIS — E119 Type 2 diabetes mellitus without complications: Secondary | ICD-10-CM

## 2014-10-22 DIAGNOSIS — I159 Secondary hypertension, unspecified: Secondary | ICD-10-CM

## 2014-10-22 DIAGNOSIS — Z6831 Body mass index (BMI) 31.0-31.9, adult: Secondary | ICD-10-CM | POA: Diagnosis not present

## 2014-10-22 LAB — POCT UA - MICROALBUMIN: Microalbumin Ur, POC: 20 mg/L

## 2014-10-22 LAB — POCT GLYCOSYLATED HEMOGLOBIN (HGB A1C): Hemoglobin A1C: 8.1

## 2014-10-22 MED ORDER — FENOFIBRATE 160 MG PO TABS: 160.0000 mg | ORAL_TABLET | Freq: Every day | ORAL | Status: DC

## 2014-10-22 MED ORDER — SITAGLIPTIN PHOS-METFORMIN HCL 50-1000 MG PO TABS: 1.0000 | ORAL_TABLET | Freq: Two times a day (BID) | ORAL | Status: DC

## 2014-10-22 MED ORDER — LISINOPRIL-HYDROCHLOROTHIAZIDE 20-12.5 MG PO TABS: 2.0000 | ORAL_TABLET | Freq: Every day | ORAL | Status: DC

## 2014-10-22 MED ORDER — ROSUVASTATIN CALCIUM 20 MG PO TABS: 20.0000 mg | ORAL_TABLET | Freq: Every day | ORAL | Status: DC

## 2014-10-22 MED ORDER — GLIPIZIDE ER 10 MG PO TB24: 10.0000 mg | ORAL_TABLET | Freq: Every day | ORAL | Status: DC

## 2014-10-22 NOTE — Progress Notes (Signed)
Subjective:    Patient ID: Philip Huang, male    DOB: [birth year], 64 y.o.   MRN: [id number]  Patient here today for follow up of chronic medical problems.Hgba1c was elevated last time and he has been working hard to watch diet and is doing much better. Have ad to change meds due to insurance and patient is confused about what he is on.   Hypertension This is a chronic problem. The current episode started more than 1 year ago. The problem is unchanged. The problem is controlled. Pertinent negatives include no chest pain, headaches, neck pain, palpitations or shortness of breath. Risk factors for coronary artery disease include dyslipidemia, diabetes mellitus and male gender. Past treatments include ACE inhibitors and diuretics. The current treatment provides moderate improvement. Compliance problems include diet and exercise.  There is no history of CAD/MI, CVA, PVD or retinopathy.  Hyperlipidemia This is a chronic problem. The current episode started more than 1 year ago. Recent lipid tests were reviewed and are variable. Exacerbating diseases include diabetes. He has no history of hypothyroidism or obesity. Factors aggravating his hyperlipidemia include thiazides. Pertinent negatives include no chest pain or shortness of breath. Current antihyperlipidemic treatment includes statins and fibric acid derivatives. Compliance problems include adherence to diet and adherence to exercise.  Risk factors for coronary artery disease include diabetes mellitus, dyslipidemia, hypertension and male sex.  Diabetes He presents for his follow-up diabetic visit. He has type 2 diabetes mellitus. No MedicAlert identification noted. Pertinent negatives for hypoglycemia include no headaches. Pertinent negatives for diabetes include no chest pain, no fatigue, no foot paresthesias, no foot ulcerations, no polydipsia and no polyuria. Pertinent negatives for diabetic complications include no CVA, PVD or retinopathy. Risk  factors for coronary artery disease include diabetes mellitus, dyslipidemia, hypertension and male sex. Current diabetic treatment includes oral agent (triple therapy). He is compliant with treatment some of the time. His weight is stable. When asked about meal planning, he reported none. He has not had a previous visit with a dietitian. He rarely participates in exercise. His breakfast blood glucose is taken between 8-9 am. His breakfast blood glucose range is generally 140-180 mg/dl. His highest blood glucose is >200 mg/dl. His overall blood glucose range is 140-180 mg/dl. An ACE inhibitor/angiotensin II receptor blocker is being taken. He does not see a podiatrist.Eye exam is not current.      Review of Systems  Constitutional: Negative for fatigue.  Respiratory: Negative for shortness of breath.   Cardiovascular: Negative for chest pain and palpitations.  Endocrine: Negative for polydipsia and polyuria.  Musculoskeletal: Negative for neck pain.  Neurological: Negative for headaches.  All other systems reviewed and are negative.      Objective:   Physical Exam  Constitutional: He is oriented to person, place, and time. He appears well-developed and well-nourished.  HENT:  Head: Normocephalic.  Right Ear: External ear normal.  Left Ear: External ear normal.  Nose: Nose normal.  Mouth/Throat: Oropharynx is clear and moist.  Eyes: EOM are normal. Pupils are equal, round, and reactive to light.  Neck: Normal range of motion. Neck supple. No JVD present. No thyromegaly present.  Cardiovascular: Normal rate, regular rhythm, normal heart sounds and intact distal pulses.  Exam reveals no gallop and no friction rub.   No murmur heard. Pulmonary/Chest: Effort normal and breath sounds normal. No respiratory distress. He has no wheezes. He has no rales. He exhibits no tenderness.  Abdominal: Soft. Bowel sounds are normal. He exhibits no mass. There  is no tenderness.  Genitourinary: Penis  normal.  Musculoskeletal: Normal range of motion. He exhibits no edema.  Lymphadenopathy:    He has no cervical adenopathy.  Neurological: He is alert and oriented to person, place, and time. No cranial nerve deficit.  Skin: Skin is warm and dry.  Erythematous patchy macular rash on abd wall bil  Psychiatric: He has a normal mood and affect. His behavior is normal. Judgment and thought content normal.    BP 155/79 mmHg  Pulse 63  Temp(Src) 96.8 F (36 C) (Oral)  Ht 5' 9" (1.753 m)  Wt 210 lb (95.255 kg)  BMI 31.00 kg/m2 Results for orders placed or performed in visit on 10/22/14  POCT glycosylated hemoglobin (Hb A1C)  Result Value Ref Range   Hemoglobin A1C 8.1%   POCT UA - Microalbumin  Result Value Ref Range   Microalbumin Ur, POC 20 mg/L            Assessment & Plan:  1. Hyperlipidemia Low fat diet - NMR, lipoprofile - fenofibrate 160 MG tablet; Take 1 tablet (160 mg total) by mouth daily.  Dispense: 30 tablet; Refill: 5 - rosuvastatin (CRESTOR) 20 MG tablet; Take 1 tablet (20 mg total) by mouth daily.  Dispense: 30 tablet; Refill: 5  2. Secondary hypertension, unspecified Do not add slat to diet - CMP14+EGFR - lisinopril-hydrochlorothiazide (PRINZIDE,ZESTORETIC) 20-12.5 MG per tablet; Take 2 tablets by mouth daily.  Dispense: 60 tablet; Refill: 5  3. Type 2 diabetes mellitus without complication Strict carb counting If no better at next visit willl have to start on insulin - POCT glycosylated hemoglobin (Hb A1C) - POCT UA - Microalbumin - Microalbumin, urine - glipiZIDE (GLUCOTROL XL) 10 MG 24 hr tablet; Take 1 tablet (10 mg total) by mouth daily.  Dispense: 30 tablet; Refill: 5 - sitaGLIPtin-metformin (JANUMET) 50-1000 MG per tablet; Take 1 tablet by mouth 2 (two) times daily with a meal.  Dispense: 60 tablet; Refill: 5  4. BMI 31.0-31.9,adult Discussed diet and exercise for person with BMI >25 Will recheck weight in 3-6 months     Labs pending Health  maintenance reviewed Diet and exercise encouraged Continue all meds Follow up  In 3 months   [place], FNP

## 2014-10-22 NOTE — Patient Instructions (Signed)

## 2014-10-23 LAB — CMP14+EGFR
ALBUMIN: 4.6 g/dL (ref 3.6–4.8)
ALT: 39 IU/L (ref 0–44)
AST: 28 IU/L (ref 0–40)
Albumin/Globulin Ratio: 1.8 (ref 1.1–2.5)
Alkaline Phosphatase: 47 IU/L (ref 39–117)
BUN / CREAT RATIO: 18 (ref 10–22)
BUN: 19 mg/dL (ref 8–27)
Bilirubin Total: 0.4 mg/dL (ref 0.0–1.2)
CALCIUM: 9.7 mg/dL (ref 8.6–10.2)
CHLORIDE: 101 mmol/L (ref 97–108)
CO2: 25 mmol/L (ref 18–29)
Creatinine, Ser: 1.04 mg/dL (ref 0.76–1.27)
GFR calc Af Amer: 89 mL/min/{1.73_m2} (ref >59)
GFR, EST NON AFRICAN AMERICAN: 77 mL/min/{1.73_m2} (ref >59)
GLOBULIN, TOTAL: 2.5 g/dL (ref 1.5–4.5)
Glucose: 157 mg/dL — ABNORMAL HIGH (ref 65–99)
Potassium: 4.7 mmol/L (ref 3.5–5.2)
Sodium: 141 mmol/L (ref 134–144)
TOTAL PROTEIN: 7.1 g/dL (ref 6.0–8.5)

## 2014-10-23 LAB — NMR, LIPOPROFILE
CHOLESTEROL: 186 mg/dL (ref 100–199)
HDL CHOLESTEROL BY NMR: 36 mg/dL — AB (ref >39)
HDL Particle Number: 30.4 umol/L — ABNORMAL LOW (ref >=30.5)
LDL Particle Number: 1846 nmol/L — ABNORMAL HIGH (ref <1000)
LDL Size: 20.4 nm (ref >20.5)
LDL-C: 108 mg/dL — AB (ref 0–99)
LP-IR Score: 87 — ABNORMAL HIGH (ref <=45)
Small LDL Particle Number: 1017 nmol/L — ABNORMAL HIGH (ref <=527)
Triglycerides by NMR: 209 mg/dL — ABNORMAL HIGH (ref 0–149)

## 2014-10-23 LAB — MICROALBUMIN, URINE: Microalbumin, Urine: 10.5 ug/mL (ref 0.0–17.0)

## 2014-11-19 ENCOUNTER — Telehealth: Payer: Self-pay | Admitting: Nurse Practitioner

## 2014-11-19 ENCOUNTER — Other Ambulatory Visit: Payer: Self-pay | Admitting: Nurse Practitioner

## 2014-11-19 MED ORDER — TRIAMCINOLONE ACETONIDE 0.1 % EX CREA: 1.0000 "application " | TOPICAL_CREAM | Freq: Two times a day (BID) | CUTANEOUS | Status: DC

## 2014-11-19 NOTE — Telephone Encounter (Signed)
Glipizide and lisinopril was filled last month for 6 months.

## 2014-12-31 ENCOUNTER — Telehealth: Payer: Self-pay | Admitting: Nurse Practitioner

## 2014-12-31 NOTE — Telephone Encounter (Signed)
Patient aware we do not have any samples. 

## 2015-01-04 ENCOUNTER — Ambulatory Visit (INDEPENDENT_AMBULATORY_CARE_PROVIDER_SITE_OTHER): Payer: BLUE CROSS/BLUE SHIELD | Admitting: Nurse Practitioner

## 2015-01-04 ENCOUNTER — Encounter: Payer: Self-pay | Admitting: Nurse Practitioner

## 2015-01-04 VITALS — BP 146/78 | HR 77 | Temp 97.9°F | Ht 69.0 in | Wt 198.6 lb

## 2015-01-04 DIAGNOSIS — M5442 Lumbago with sciatica, left side: Secondary | ICD-10-CM

## 2015-01-04 MED ORDER — METHYLPREDNISOLONE ACETATE 80 MG/ML IJ SUSP
80.0000 mg | Freq: Once | INTRAMUSCULAR | Status: AC
  Administered 2015-01-04: 80 mg via INTRAMUSCULAR

## 2015-01-04 MED ORDER — CYCLOBENZAPRINE HCL 10 MG PO TABS: 10.0000 mg | ORAL_TABLET | Freq: Three times a day (TID) | ORAL | Status: DC | PRN

## 2015-01-04 MED ORDER — MELOXICAM 15 MG PO TABS: 15.0000 mg | ORAL_TABLET | Freq: Every day | ORAL | Status: DC

## 2015-01-04 NOTE — Progress Notes (Signed)
   Subjective:    Patient ID: Philip Huang, male    DOB: 21-Feb-1951, 64 y.o.   MRN: [id number]  HPI Patient in c/o left hip that started 3-4 weeks ago- has been using topical pain relievers OTC with no relief. He has been having muscle spasms in his hip- when he bends over the pain is so bad that he can hardly bend over. Rates pain 9/10- pain is constant but varies in intensity- walking does not bothyer it. Just bending over or riding in a car increases pain.    Review of Systems  Constitutional: Negative.   HENT: Negative.   Respiratory: Negative.   Cardiovascular: Negative.   Gastrointestinal: Negative.   Genitourinary: Negative.   Neurological: Negative.   Psychiatric/Behavioral: Negative.   All other systems reviewed and are negative.      Objective:   Physical Exam  Constitutional: He appears well-developed and well-nourished.  Cardiovascular: Normal rate, regular rhythm and normal heart sounds.   Pulmonary/Chest: Effort normal.  Musculoskeletal:   No point tenderness in hip Pain on palpation of left buttocks FROM of hip withot pain FROM  Of lumbar spine with pain on full flexion (-) SLR bil Motor strength and sensation distally intact  Neurological: He has normal reflexes.  Skin: Skin is warm and dry.  Psychiatric: He has a normal mood and affect. His behavior is normal. Judgment and thought content normal.    BP 146/78 mmHg  Pulse 77  Temp(Src) 97.9 F (36.6 C) (Oral)  Ht 5\' 9"  (1.753 m)  Wt 198 lb 9.6 oz (90.084 kg)  BMI 29.31 kg/m2       Assessment & Plan:  1. Left-sided low back pain with left-sided sciatica Moist heat Rest RTO prn - methylPREDNISolone acetate (DEPO-MEDROL) injection 80 mg; Inject 1 mL (80 mg total) into the muscle once. - cyclobenzaprine (FLEXERIL) 10 MG tablet; Take 1 tablet (10 mg total) by mouth 3 (three) times daily as needed for muscle spasms.  Dispense: 30 tablet; Refill: 1 - meloxicam (MOBIC) 15 MG tablet; Take 1 tablet (15  mg total) by mouth daily.  Dispense: 30 tablet; Refill: [place], FNP

## 2015-01-04 NOTE — Patient Instructions (Signed)
Back Pain, Adult Low back pain is very common. About 1 in 5 people have back pain.The cause of low back pain is rarely dangerous. The pain often gets better over time.About half of people with a sudden onset of back pain feel better in just 2 weeks. About 8 in 10 people feel better by 6 weeks.  CAUSES Some common causes of back pain include:  Strain of the muscles or ligaments supporting the spine.  Wear and tear (degeneration) of the spinal discs.  Arthritis.  Direct injury to the back. DIAGNOSIS Most of the time, the direct cause of low back pain is not known.However, back pain can be treated effectively even when the exact cause of the pain is unknown.Answering your caregiver's questions about your overall health and symptoms is one of the most accurate ways to make sure the cause of your pain is not dangerous. If your caregiver needs more information, he or she may order lab work or imaging tests (X-rays or MRIs).However, even if imaging tests show changes in your back, this usually does not require surgery. HOME CARE INSTRUCTIONS For many people, back pain returns.Since low back pain is rarely dangerous, it is often a condition that people can learn to manageon their own.   Remain active. It is stressful on the back to sit or stand in one place. Do not sit, drive, or stand in one place for more than 30 minutes at a time. Take short walks on level surfaces as soon as pain allows.Try to increase the length of time you walk each day.  Do not stay in bed.Resting more than 1 or 2 days can delay your recovery.  Do not avoid exercise or work.Your body is made to move.It is not dangerous to be active, even though your back may hurt.Your back will likely heal faster if you return to being active before your pain is gone.  Pay attention to your body when you bend and lift. Many people have less discomfortwhen lifting if they bend their knees, keep the load close to their bodies,and  avoid twisting. Often, the most comfortable positions are those that put less stress on your recovering back.  Find a comfortable position to sleep. Use a firm mattress and lie on your side with your knees slightly bent. If you lie on your back, put a pillow under your knees.  Only take over-the-counter or prescription medicines as directed by your caregiver. Over-the-counter medicines to reduce pain and inflammation are often the most helpful.Your caregiver may prescribe muscle relaxant drugs.These medicines help dull your pain so you can more quickly return to your normal activities and healthy exercise.  Put ice on the injured area.  Put ice in a plastic bag.  Place a towel between your skin and the bag.  Leave the ice on for 15-20 minutes, 03-04 times a day for the first 2 to 3 days. After that, ice and heat may be alternated to reduce pain and spasms.  Ask your caregiver about trying back exercises and gentle massage. This may be of some benefit.  Avoid feeling anxious or stressed.Stress increases muscle tension and can worsen back pain.It is important to recognize when you are anxious or stressed and learn ways to manage it.Exercise is a great option. SEEK MEDICAL CARE IF:  You have pain that is not relieved with rest or medicine.  You have pain that does not improve in 1 week.  You have new symptoms.  You are generally not feeling well. SEEK   IMMEDIATE MEDICAL CARE IF:   You have pain that radiates from your back into your legs.  You develop new bowel or bladder control problems.  You have unusual weakness or numbness in your arms or legs.  You develop nausea or vomiting.  You develop abdominal pain.  You feel faint. Document Released: 07/23/2005 Document Revised: 01/22/2012 Document Reviewed: 11/24/2013 [employer] Patient Information 2015 [employer]. This information is not intended to replace advice given to you by your health care provider. Make sure you  discuss any questions you have with your health care provider.  

## 2015-01-06 ENCOUNTER — Telehealth: Payer: Self-pay | Admitting: Nurse Practitioner

## 2015-01-10 ENCOUNTER — Telehealth: Payer: Self-pay

## 2015-01-10 MED ORDER — METFORMIN HCL 1000 MG PO TABS: 1000.0000 mg | ORAL_TABLET | Freq: Two times a day (BID) | ORAL | Status: DC

## 2015-01-10 NOTE — Telephone Encounter (Signed)
Give pt Jauniva 100 mg samples to take daily and rx sent to pharmacy for metformin 1000mg  BID for one month

## 2015-01-10 NOTE — Telephone Encounter (Signed)
Samples up front. Patient notified that samples are ready for pick up and meds sent to pharmacy

## 2015-01-10 NOTE — Telephone Encounter (Signed)
You said that you would figure something out to call in instead of Janumet until it comes in through patient assistance  He will be out tommorow

## 2015-01-22 ENCOUNTER — Other Ambulatory Visit: Payer: Self-pay | Admitting: Nurse Practitioner

## 2015-04-07 ENCOUNTER — Encounter: Payer: Self-pay | Admitting: Nurse Practitioner

## 2015-04-07 ENCOUNTER — Ambulatory Visit (INDEPENDENT_AMBULATORY_CARE_PROVIDER_SITE_OTHER): Payer: BLUE CROSS/BLUE SHIELD | Admitting: Nurse Practitioner

## 2015-04-07 VITALS — BP 133/70 | HR 64 | Temp 97.1°F | Ht 69.0 in | Wt 194.0 lb

## 2015-04-07 DIAGNOSIS — I159 Secondary hypertension, unspecified: Secondary | ICD-10-CM | POA: Diagnosis not present

## 2015-04-07 DIAGNOSIS — Z6828 Body mass index (BMI) 28.0-28.9, adult: Secondary | ICD-10-CM

## 2015-04-07 DIAGNOSIS — E785 Hyperlipidemia, unspecified: Secondary | ICD-10-CM

## 2015-04-07 DIAGNOSIS — Z1211 Encounter for screening for malignant neoplasm of colon: Secondary | ICD-10-CM

## 2015-04-07 DIAGNOSIS — E119 Type 2 diabetes mellitus without complications: Secondary | ICD-10-CM | POA: Diagnosis not present

## 2015-04-07 LAB — POCT GLYCOSYLATED HEMOGLOBIN (HGB A1C): Hemoglobin A1C: 7.6

## 2015-04-07 MED ORDER — FENOFIBRATE 160 MG PO TABS: 160.0000 mg | ORAL_TABLET | Freq: Every day | ORAL | Status: DC

## 2015-04-07 MED ORDER — GLIPIZIDE ER 10 MG PO TB24: 10.0000 mg | ORAL_TABLET | Freq: Every day | ORAL | Status: DC

## 2015-04-07 MED ORDER — SITAGLIPTIN PHOS-METFORMIN HCL 50-1000 MG PO TABS: 1.0000 | ORAL_TABLET | Freq: Two times a day (BID) | ORAL | Status: DC

## 2015-04-07 MED ORDER — ROSUVASTATIN CALCIUM 20 MG PO TABS: 20.0000 mg | ORAL_TABLET | Freq: Every day | ORAL | Status: DC

## 2015-04-07 MED ORDER — LISINOPRIL-HYDROCHLOROTHIAZIDE 20-12.5 MG PO TABS: 2.0000 | ORAL_TABLET | Freq: Every day | ORAL | Status: DC

## 2015-04-07 NOTE — Progress Notes (Signed)
Subjective:    Patient ID: Philip Huang, male    DOB: 07/03/1951, 64 y.o.   MRN: [id number]  Patient here today for follow up of chronic medical problems.   Hypertension This is a chronic problem. The current episode started more than 1 year ago. The problem is unchanged. The problem is controlled. Pertinent negatives include no chest pain, headaches, neck pain, palpitations or shortness of breath. Risk factors for coronary artery disease include dyslipidemia, diabetes mellitus and male gender. Past treatments include ACE inhibitors and diuretics. The current treatment provides moderate improvement. Compliance problems include diet and exercise.  There is no history of CAD/MI, CVA, PVD or retinopathy.  Hyperlipidemia This is a chronic problem. The current episode started more than 1 year ago. Recent lipid tests were reviewed and are variable. Exacerbating diseases include diabetes. He has no history of hypothyroidism or obesity. Factors aggravating his hyperlipidemia include thiazides. Pertinent negatives include no chest pain or shortness of breath. Current antihyperlipidemic treatment includes statins and fibric acid derivatives. Compliance problems include adherence to diet and adherence to exercise.  Risk factors for coronary artery disease include diabetes mellitus, dyslipidemia, hypertension and male sex.  Diabetes He presents for his follow-up diabetic visit. He has type 2 diabetes mellitus. No MedicAlert identification noted. Pertinent negatives for hypoglycemia include no headaches. Pertinent negatives for diabetes include no chest pain, no fatigue, no foot paresthesias, no foot ulcerations, no polydipsia and no polyuria. Pertinent negatives for diabetic complications include no CVA, PVD or retinopathy. Risk factors for coronary artery disease include diabetes mellitus, dyslipidemia, hypertension and male sex. Current diabetic treatment includes oral agent (triple therapy). He is compliant  with treatment some of the time. His weight is stable. When asked about meal planning, he reported none. He has not had a previous visit with a dietitian. He rarely participates in exercise. His breakfast blood glucose is taken between 8-9 am. His breakfast blood glucose range is generally 140-180 mg/dl. His highest blood glucose is >200 mg/dl. His overall blood glucose range is 140-180 mg/dl. An ACE inhibitor/angiotensin II receptor blocker is being taken. He does not see a podiatrist.Eye exam is not current.      Review of Systems  Constitutional: Negative for fatigue.  HENT: Negative.   Respiratory: Negative for shortness of breath.   Cardiovascular: Negative for chest pain and palpitations.  Gastrointestinal: Negative.   Endocrine: Negative for polydipsia and polyuria.  Musculoskeletal: Negative.  Negative for neck pain.  Neurological: Negative for headaches.  Psychiatric/Behavioral: Negative.   All other systems reviewed and are negative.      Objective:   Physical Exam  Constitutional: He is oriented to person, place, and time. He appears well-developed and well-nourished.  HENT:  Head: Normocephalic.  Right Ear: External ear normal.  Left Ear: External ear normal.  Nose: Nose normal.  Mouth/Throat: Oropharynx is clear and moist.  Eyes: EOM are normal. Pupils are equal, round, and reactive to light.  Neck: Normal range of motion. Neck supple. No JVD present. No thyromegaly present.  Cardiovascular: Normal rate, regular rhythm, normal heart sounds and intact distal pulses.  Exam reveals no gallop and no friction rub.   No murmur heard. Pulmonary/Chest: Effort normal and breath sounds normal. No respiratory distress. He has no wheezes. He has no rales. He exhibits no tenderness.  Abdominal: Soft. Bowel sounds are normal. He exhibits no mass. There is no tenderness.  Genitourinary: Penis normal.  Musculoskeletal: Normal range of motion. He exhibits no edema.  Lymphadenopathy:  He has no cervical adenopathy.  Neurological: He is alert and oriented to person, place, and time. No cranial nerve deficit.  Skin: Skin is warm and dry.  Erythematous patchy macular rash on abd wall bil  Psychiatric: He has a normal mood and affect. His behavior is normal. Judgment and thought content normal.    BP 133/70 mmHg  Pulse 64  Temp(Src) 97.1 F (36.2 C) (Oral)  Ht 5' 9"  (1.753 m)  Wt 194 lb (87.998 kg)  BMI 28.64 kg/m2 Results for orders placed or performed in visit on 04/07/15  POCT glycosylated hemoglobin (Hb A1C)  Result Value Ref Range   Hemoglobin A1C 7.6             Assessment & Plan:  1. Hyperlipidemia Low fat diet - NMR, lipoprofile - fenofibrate 160 MG tablet; Take 1 tablet (160 mg total) by mouth daily.  Dispense: 30 tablet; Refill: 5 - rosuvastatin (CRESTOR) 20 MG tablet; Take 1 tablet (20 mg total) by mouth daily.  Dispense: 30 tablet; Refill: 5  2. Secondary hypertension, unspecified Do not add slat to diet - CMP14+EGFR - lisinopril-hydrochlorothiazide (PRINZIDE,ZESTORETIC) 20-12.5 MG per tablet; Take 2 tablets by mouth daily.  Dispense: 60 tablet; Refill: 5  3. Type 2 diabetes mellitus without complication Strict carb counting hgba1c has improved-  - POCT glycosylated hemoglobin (Hb A1C) - POCT UA - Microalbumin - Microalbumin, urine - glipiZIDE (GLUCOTROL XL) 10 MG 24 hr tablet; Take 1 tablet (10 mg total) by mouth daily.  Dispense: 30 tablet; Refill: 5 - sitaGLIPtin-metformin (JANUMET) 50-1000 MG per tablet; Take 1 tablet by mouth 2 (two) times daily with a meal.  Dispense: 60 tablet; Refill: 5  4. BMI 28-28.9 Discussed diet and exercise for person with BMI >25 Will recheck weight in 3-6 months    Referral for colonoscopy Labs pending Health maintenance reviewed Diet and exercise encouraged Continue all meds Follow up  In 3 months   [place], FNP

## 2015-04-07 NOTE — Addendum Note (Signed)
Addended by: Chevis Pretty on: 04/07/2015 08:34 AM   Modules accepted: Orders

## 2015-04-07 NOTE — Patient Instructions (Signed)

## 2015-04-08 LAB — CMP14+EGFR
ALBUMIN: 4.5 g/dL (ref 3.6–4.8)
ALT: 32 IU/L (ref 0–44)
AST: 27 IU/L (ref 0–40)
Albumin/Globulin Ratio: 1.9 (ref 1.1–2.5)
Alkaline Phosphatase: 44 IU/L (ref 39–117)
BUN / CREAT RATIO: 21 (ref 10–22)
BUN: 22 mg/dL (ref 8–27)
Bilirubin Total: 0.3 mg/dL (ref 0.0–1.2)
CALCIUM: 9.3 mg/dL (ref 8.6–10.2)
CO2: 24 mmol/L (ref 18–29)
Chloride: 100 mmol/L (ref 97–108)
Creatinine, Ser: 1.04 mg/dL (ref 0.76–1.27)
GFR calc non Af Amer: 77 mL/min/{1.73_m2} (ref >59)
GFR, EST AFRICAN AMERICAN: 89 mL/min/{1.73_m2} (ref >59)
GLUCOSE: 128 mg/dL — AB (ref 65–99)
Globulin, Total: 2.4 g/dL (ref 1.5–4.5)
Potassium: 4 mmol/L (ref 3.5–5.2)
Sodium: 141 mmol/L (ref 134–144)
TOTAL PROTEIN: 6.9 g/dL (ref 6.0–8.5)

## 2015-04-08 LAB — LIPID PANEL
Chol/HDL Ratio: 4.3 ratio units (ref 0.0–5.0)
Cholesterol, Total: 182 mg/dL (ref 100–199)
HDL: 42 mg/dL (ref >39)
LDL CALC: 112 mg/dL — AB (ref 0–99)
Triglycerides: 142 mg/dL (ref 0–149)
VLDL Cholesterol Cal: 28 mg/dL (ref 5–40)

## 2015-04-12 ENCOUNTER — Telehealth: Payer: Self-pay

## 2015-04-12 DIAGNOSIS — E119 Type 2 diabetes mellitus without complications: Secondary | ICD-10-CM

## 2015-04-12 MED ORDER — SITAGLIPTIN PHOS-METFORMIN HCL 50-1000 MG PO TABS: 1.0000 | ORAL_TABLET | Freq: Two times a day (BID) | ORAL | Status: DC

## 2015-04-12 MED ORDER — SAXAGLIPTIN-METFORMIN ER 5-1000 MG PO TB24: 1.0000 | ORAL_TABLET | Freq: Every day | ORAL | Status: DC

## 2015-04-12 NOTE — Telephone Encounter (Signed)
Fyi, per wife, because of income patient is able to get janumet for free through [facility].

## 2015-04-12 NOTE — Telephone Encounter (Signed)
janumet changed to [place] due to insurance

## 2015-04-12 NOTE — Telephone Encounter (Signed)
Insurance denied Janumet  Kombiglyze must be tried and failed

## 2015-04-14 ENCOUNTER — Other Ambulatory Visit: Payer: Self-pay | Admitting: Nurse Practitioner

## 2015-04-18 ENCOUNTER — Other Ambulatory Visit: Payer: Self-pay | Admitting: Nurse Practitioner

## 2015-04-19 MED ORDER — CRESTOR 20 MG PO TABS: 20.0000 mg | ORAL_TABLET | Freq: Every day | ORAL | Status: DC

## 2015-04-19 NOTE — Telephone Encounter (Signed)
Resent brand name

## 2015-04-30 ENCOUNTER — Ambulatory Visit (INDEPENDENT_AMBULATORY_CARE_PROVIDER_SITE_OTHER): Payer: BLUE CROSS/BLUE SHIELD

## 2015-04-30 DIAGNOSIS — Z23 Encounter for immunization: Secondary | ICD-10-CM

## 2015-05-02 LAB — HM DIABETES EYE EXAM

## 2015-05-10 ENCOUNTER — Other Ambulatory Visit: Payer: Self-pay | Admitting: Nurse Practitioner

## 2015-07-06 ENCOUNTER — Telehealth: Payer: Self-pay | Admitting: Nurse Practitioner

## 2015-07-06 NOTE — Telephone Encounter (Signed)
Pt given appt 12/13 at 9:00 with MMM.

## 2015-07-19 ENCOUNTER — Encounter: Payer: Self-pay | Admitting: Nurse Practitioner

## 2015-07-19 ENCOUNTER — Ambulatory Visit (INDEPENDENT_AMBULATORY_CARE_PROVIDER_SITE_OTHER): Payer: BLUE CROSS/BLUE SHIELD | Admitting: Nurse Practitioner

## 2015-07-19 VITALS — BP 136/82 | HR 63 | Temp 97.2°F | Ht 69.0 in | Wt 200.0 lb

## 2015-07-19 DIAGNOSIS — I159 Secondary hypertension, unspecified: Secondary | ICD-10-CM | POA: Diagnosis not present

## 2015-07-19 DIAGNOSIS — Z1159 Encounter for screening for other viral diseases: Secondary | ICD-10-CM | POA: Diagnosis not present

## 2015-07-19 DIAGNOSIS — E785 Hyperlipidemia, unspecified: Secondary | ICD-10-CM | POA: Diagnosis not present

## 2015-07-19 DIAGNOSIS — Z6828 Body mass index (BMI) 28.0-28.9, adult: Secondary | ICD-10-CM

## 2015-07-19 DIAGNOSIS — E119 Type 2 diabetes mellitus without complications: Secondary | ICD-10-CM

## 2015-07-19 LAB — POCT GLYCOSYLATED HEMOGLOBIN (HGB A1C): Hemoglobin A1C: 8

## 2015-07-19 NOTE — Progress Notes (Signed)
Subjective:    Patient ID: Philip Huang, male    DOB: 28-Aug-1950, 64 y.o.   MRN: [id number]  Patient here today for follow up of chronic medical problems.   Hypertension This is a chronic problem. The current episode started more than 1 year ago. The problem is unchanged. The problem is controlled. Pertinent negatives include no chest pain, headaches, neck pain, palpitations or shortness of breath. Risk factors for coronary artery disease include dyslipidemia, diabetes mellitus and male gender. Past treatments include ACE inhibitors and diuretics. The current treatment provides moderate improvement. Compliance problems include diet and exercise.  There is no history of CAD/MI, CVA, PVD or retinopathy.  Hyperlipidemia This is a chronic problem. The current episode started more than 1 year ago. Recent lipid tests were reviewed and are variable. Exacerbating diseases include diabetes. He has no history of hypothyroidism or obesity. Factors aggravating his hyperlipidemia include thiazides. Pertinent negatives include no chest pain or shortness of breath. Current antihyperlipidemic treatment includes statins and fibric acid derivatives. Compliance problems include adherence to diet and adherence to exercise.  Risk factors for coronary artery disease include diabetes mellitus, dyslipidemia, hypertension and male sex.  Diabetes He presents for his follow-up diabetic visit. He has type 2 diabetes mellitus. No MedicAlert identification noted. Pertinent negatives for hypoglycemia include no headaches. Pertinent negatives for diabetes include no chest pain, no fatigue, no foot paresthesias, no foot ulcerations, no polydipsia and no polyuria. Pertinent negatives for diabetic complications include no CVA, PVD or retinopathy. Risk factors for coronary artery disease include diabetes mellitus, dyslipidemia, hypertension and male sex. Current diabetic treatment includes oral agent (triple therapy). He is compliant  with treatment some of the time. His weight is stable. When asked about meal planning, he reported none. He has not had a previous visit with a dietitian. He rarely participates in exercise. His breakfast blood glucose is taken between 8-9 am. His breakfast blood glucose range is generally 140-180 mg/dl. His highest blood glucose is >200 mg/dl. His overall blood glucose range is 140-180 mg/dl. An ACE inhibitor/angiotensin II receptor blocker is being taken. He does not see a podiatrist.Eye exam is not current.      Review of Systems  Constitutional: Negative for fatigue.  HENT: Negative.   Respiratory: Negative for shortness of breath.   Cardiovascular: Negative for chest pain and palpitations.  Gastrointestinal: Negative.   Endocrine: Negative for polydipsia and polyuria.  Musculoskeletal: Negative.  Negative for neck pain.  Neurological: Negative for headaches.  Psychiatric/Behavioral: Negative.   All other systems reviewed and are negative.      Objective:   Physical Exam  Constitutional: He is oriented to person, place, and time. He appears well-developed and well-nourished.  HENT:  Head: Normocephalic.  Right Ear: External ear normal.  Left Ear: External ear normal.  Nose: Nose normal.  Mouth/Throat: Oropharynx is clear and moist.  Eyes: EOM are normal. Pupils are equal, round, and reactive to light.  Neck: Normal range of motion. Neck supple. No JVD present. No thyromegaly present.  Cardiovascular: Normal rate, regular rhythm, normal heart sounds and intact distal pulses.  Exam reveals no gallop and no friction rub.   No murmur heard. Pulmonary/Chest: Effort normal and breath sounds normal. No respiratory distress. He has no wheezes. He has no rales. He exhibits no tenderness.  Abdominal: Soft. Bowel sounds are normal. He exhibits no mass. There is no tenderness.  Small  Umbilical hernia- easily reducible  Genitourinary: Penis normal.  Musculoskeletal: Normal range of  motion.  He exhibits no edema.  Lymphadenopathy:    He has no cervical adenopathy.  Neurological: He is alert and oriented to person, place, and time. No cranial nerve deficit.  Skin: Skin is warm and dry.  Erythematous patchy macular rash on abd wall bil  Psychiatric: He has a normal mood and affect. His behavior is normal. Judgment and thought content normal.   BP 136/82 mmHg  Pulse 63  Temp(Src) 97.2 F (36.2 C) (Oral)  Ht 5' 9"  (1.753 m)  Wt 200 lb (90.719 kg)  BMI 29.52 kg/m2   Results for orders placed or performed in visit on 07/19/15  POCT glycosylated hemoglobin (Hb A1C)  Result Value Ref Range   Hemoglobin A1C 8.0         Assessment & Plan:  1. Hyperlipidemia Low fat diet - CMP14+EGFR - Lipid panel  2. Secondary hypertension, unspecified Do not add slat to diet - CMP14+EGFR  3. Type 2 diabetes mellitus without complication, unspecified long term insulin use status ([facility]) Stricter carb counting- if hgba1c increases at next visit my try victozia, byetta or may have to go on insulin - POCT glycosylated hemoglobin (Hb A1C) - CMP14+EGFR  4. BMI 28.0-28.9,adult Discussed diet and exercise for person with BMI >25 Will recheck weight in 3-6 months   5. Need for hepatitis C screening test - Hepatitis C antibody    Labs pending Health maintenance reviewed Diet and exercise encouraged Continue all meds Follow up  In 3 months   [place], FNP

## 2015-07-19 NOTE — Patient Instructions (Signed)

## 2015-07-20 LAB — CMP14+EGFR
ALBUMIN: 4.6 g/dL (ref 3.6–4.8)
ALK PHOS: 50 IU/L (ref 39–117)
ALT: 39 IU/L (ref 0–44)
AST: 27 IU/L (ref 0–40)
Albumin/Globulin Ratio: 1.9 (ref 1.1–2.5)
BUN / CREAT RATIO: 18 (ref 10–22)
BUN: 17 mg/dL (ref 8–27)
Bilirubin Total: 0.3 mg/dL (ref 0.0–1.2)
CALCIUM: 9.8 mg/dL (ref 8.6–10.2)
CO2: 25 mmol/L (ref 18–29)
CREATININE: 0.95 mg/dL (ref 0.76–1.27)
Chloride: 97 mmol/L (ref 96–106)
GFR calc non Af Amer: 85 mL/min/{1.73_m2} (ref >59)
GFR, EST AFRICAN AMERICAN: 98 mL/min/{1.73_m2} (ref >59)
GLUCOSE: 150 mg/dL — AB (ref 65–99)
Globulin, Total: 2.4 g/dL (ref 1.5–4.5)
Potassium: 4.3 mmol/L (ref 3.5–5.2)
Sodium: 140 mmol/L (ref 134–144)
TOTAL PROTEIN: 7 g/dL (ref 6.0–8.5)

## 2015-07-20 LAB — LIPID PANEL
CHOLESTEROL TOTAL: 176 mg/dL (ref 100–199)
Chol/HDL Ratio: 4.8 ratio units (ref 0.0–5.0)
HDL: 37 mg/dL — AB (ref >39)
LDL Calculated: 104 mg/dL — ABNORMAL HIGH (ref 0–99)
Triglycerides: 175 mg/dL — ABNORMAL HIGH (ref 0–149)
VLDL Cholesterol Cal: 35 mg/dL (ref 5–40)

## 2015-07-20 LAB — HEPATITIS C ANTIBODY

## 2015-09-13 ENCOUNTER — Telehealth: Payer: Self-pay | Admitting: Nurse Practitioner

## 2015-09-16 ENCOUNTER — Telehealth: Payer: Self-pay | Admitting: Nurse Practitioner

## 2015-09-16 MED ORDER — GLUCOSE BLOOD VI STRP: ORAL_STRIP | Status: DC

## 2015-09-16 NOTE — Telephone Encounter (Signed)
done

## 2015-11-08 ENCOUNTER — Ambulatory Visit (INDEPENDENT_AMBULATORY_CARE_PROVIDER_SITE_OTHER): Payer: BLUE CROSS/BLUE SHIELD | Admitting: Nurse Practitioner

## 2015-11-08 ENCOUNTER — Encounter: Payer: Self-pay | Admitting: Nurse Practitioner

## 2015-11-08 VITALS — BP 131/81 | HR 67 | Temp 97.0°F | Ht 69.0 in | Wt 198.0 lb

## 2015-11-08 DIAGNOSIS — E785 Hyperlipidemia, unspecified: Secondary | ICD-10-CM | POA: Diagnosis not present

## 2015-11-08 DIAGNOSIS — I159 Secondary hypertension, unspecified: Secondary | ICD-10-CM

## 2015-11-08 DIAGNOSIS — Z6829 Body mass index (BMI) 29.0-29.9, adult: Secondary | ICD-10-CM | POA: Diagnosis not present

## 2015-11-08 DIAGNOSIS — E119 Type 2 diabetes mellitus without complications: Secondary | ICD-10-CM

## 2015-11-08 LAB — BAYER DCA HB A1C WAIVED: HB A1C: 7.9 % — AB (ref <7.0)

## 2015-11-08 MED ORDER — LISINOPRIL-HYDROCHLOROTHIAZIDE 20-12.5 MG PO TABS: 2.0000 | ORAL_TABLET | Freq: Every day | ORAL | Status: DC

## 2015-11-08 MED ORDER — SITAGLIPTIN PHOS-METFORMIN HCL 50-1000 MG PO TABS: 1.0000 | ORAL_TABLET | Freq: Two times a day (BID) | ORAL | Status: DC

## 2015-11-08 MED ORDER — GLIPIZIDE ER 10 MG PO TB24: 10.0000 mg | ORAL_TABLET | Freq: Every day | ORAL | Status: DC

## 2015-11-08 MED ORDER — CRESTOR 20 MG PO TABS: 20.0000 mg | ORAL_TABLET | Freq: Every day | ORAL | Status: DC

## 2015-11-08 MED ORDER — FENOFIBRATE 160 MG PO TABS: 160.0000 mg | ORAL_TABLET | Freq: Every day | ORAL | Status: DC

## 2015-11-08 NOTE — Progress Notes (Signed)
Subjective:    Patient ID: Philip Huang, male    DOB: 09/11/1950, 66 y.o.   MRN: [id number]  Patient here today for follow up of chronic medical problems.  Outpatient Encounter Prescriptions as of 11/08/2015  Medication Sig  . aspirin 81 MG tablet Take 325 mg by mouth daily.   . Cholecalciferol (VITAMIN D) 2000 UNITS tablet Take 2,000 Units by mouth daily.    . CRESTOR 20 MG tablet Take 1 tablet (20 mg total) by mouth daily.  . fenofibrate 160 MG tablet Take 1 tablet (160 mg total) by mouth daily.  . fish oil-omega-3 fatty acids 1000 MG capsule Take 1 g by mouth daily.  Marland Kitchen glipiZIDE (GLUCOTROL XL) 10 MG 24 hr tablet TAKE ONE TABLET BY MOUTH ONCE DAILY  . glucose blood (BAYER CONTOUR NEXT TEST) test strip Test qd. Dx E11.9  . Lancets (LIFESCAN UNISTIK 2) MISC 1 strip by Does not apply route daily. Test 1X per day-- dx 250.02  . lisinopril-hydrochlorothiazide (PRINZIDE,ZESTORETIC) 20-12.5 MG per tablet Take 2 tablets by mouth daily.  . Multiple Vitamins-Minerals (MENS 50+ ADVANCED PO) Take by mouth.    . nitroGLYCERIN (NITROSTAT) 0.4 MG SL tablet Place 0.4 mg under the tongue every 5 (five) minutes as needed.    . sitaGLIPtin-metformin (JANUMET) 50-1000 MG per tablet Take 1 tablet by mouth 2 (two) times daily with a meal.  . triamcinolone cream (KENALOG) 0.1 % Apply 1 application topically 2 (two) times daily.  . vitamin C (ASCORBIC ACID) 500 MG tablet Take 500 mg by mouth daily.     No facility-administered encounter medications on file as of 11/08/2015.       Hypertension This is a chronic problem. The current episode started more than 1 year ago. The problem is unchanged. The problem is controlled. Pertinent negatives include no chest pain, headaches, neck pain, palpitations or shortness of breath. Risk factors for coronary artery disease include dyslipidemia, diabetes mellitus and male gender. Past treatments include ACE inhibitors and diuretics. The current treatment provides  moderate improvement. Compliance problems include diet and exercise.  There is no history of CAD/MI, CVA, PVD or retinopathy.  Hyperlipidemia This is a chronic problem. The current episode started more than 1 year ago. Recent lipid tests were reviewed and are variable. Exacerbating diseases include diabetes. He has no history of hypothyroidism or obesity. Factors aggravating his hyperlipidemia include thiazides. Pertinent negatives include no chest pain or shortness of breath. Current antihyperlipidemic treatment includes statins and fibric acid derivatives. Compliance problems include adherence to diet and adherence to exercise.  Risk factors for coronary artery disease include diabetes mellitus, dyslipidemia, hypertension and male sex.  Diabetes He presents for his follow-up diabetic visit. He has type 2 diabetes mellitus. No MedicAlert identification noted. Pertinent negatives for hypoglycemia include no headaches. Pertinent negatives for diabetes include no chest pain, no fatigue, no foot paresthesias, no foot ulcerations, no polydipsia and no polyuria. Pertinent negatives for diabetic complications include no CVA, PVD or retinopathy. Risk factors for coronary artery disease include diabetes mellitus, dyslipidemia, hypertension and male sex. Current diabetic treatment includes oral agent (triple therapy). He is compliant with treatment some of the time. His weight is stable. When asked about meal planning, he reported none. He has not had a previous visit with a dietitian. He rarely participates in exercise. His breakfast blood glucose is taken between 8-9 am. His breakfast blood glucose range is generally 140-180 mg/dl. His highest blood glucose is >200 mg/dl. His overall blood glucose range is  140-180 mg/dl. An ACE inhibitor/angiotensin II receptor blocker is being taken. He does not see a podiatrist.Eye exam is not current.      Review of Systems  Constitutional: Negative for fatigue.  HENT:  Negative.   Respiratory: Negative for shortness of breath.   Cardiovascular: Negative for chest pain and palpitations.  Gastrointestinal: Negative.   Endocrine: Negative for polydipsia and polyuria.  Musculoskeletal: Negative.  Negative for neck pain.  Neurological: Negative for headaches.  Psychiatric/Behavioral: Negative.   All other systems reviewed and are negative.      Objective:   Physical Exam  Constitutional: He is oriented to person, place, and time. He appears well-developed and well-nourished.  HENT:  Head: Normocephalic.  Right Ear: External ear normal.  Left Ear: External ear normal.  Nose: Nose normal.  Mouth/Throat: Oropharynx is clear and moist.  Eyes: EOM are normal. Pupils are equal, round, and reactive to light.  Neck: Normal range of motion. Neck supple. No JVD present. No thyromegaly present.  Cardiovascular: Normal rate, regular rhythm, normal heart sounds and intact distal pulses.  Exam reveals no gallop and no friction rub.   No murmur heard. Pulmonary/Chest: Effort normal and breath sounds normal. No respiratory distress. He has no wheezes. He has no rales. He exhibits no tenderness.  Abdominal: Soft. Bowel sounds are normal. He exhibits no mass. There is no tenderness.  Small  Umbilical hernia- easily reducible  Genitourinary: Penis normal.  Musculoskeletal: Normal range of motion. He exhibits no edema.  Lymphadenopathy:    He has no cervical adenopathy.  Neurological: He is alert and oriented to person, place, and time. No cranial nerve deficit.  Skin: Skin is warm and dry.  Erythematous patchy macular rash on abd wall bil  Psychiatric: He has a normal mood and affect. His behavior is normal. Judgment and thought content normal.   BP 131/81 mmHg  Pulse 67  Temp(Src) 97 F (36.1 C) (Oral)  Ht 5' 9"  (1.753 m)  Wt 198 lb (89.812 kg)  BMI 29.23 kg/m2  HGBA1C- 7.9% down from 8.0%    Assessment & Plan:  1. Hyperlipidemia Low fat diet - Lipid  panel - fenofibrate 160 MG tablet; Take 1 tablet (160 mg total) by mouth daily.  Dispense: 30 tablet; Refill: 5 - CRESTOR 20 MG tablet; Take 1 tablet (20 mg total) by mouth daily.  Dispense: 30 tablet; Refill: 5  2. Type 2 diabetes mellitus without complication, unspecified long term insulin use status ([place]) Stricter carb counting May add trulicity at next visit if HGBA1C is nit better - Bayer DCA Hb A1c Waived - Microalbumin / creatinine urine ratio - sitaGLIPtin-metformin (JANUMET) 50-1000 MG tablet; Take 1 tablet by mouth 2 (two) times daily with a meal.  Dispense: 60 tablet; Refill: 5 - glipiZIDE (GLUCOTROL XL) 10 MG 24 hr tablet; Take 1 tablet (10 mg total) by mouth daily.  Dispense: 30 tablet; Refill: 5  3. Secondary hypertension, unspecified Do not add salt o diet - CMP14+EGFR - lisinopril-hydrochlorothiazide (PRINZIDE,ZESTORETIC) 20-12.5 MG tablet; Take 2 tablets by mouth daily.  Dispense: 60 tablet; Refill: 5  4. BMI 29.0-29.9,adult Discussed diet and exercise for person with BMI >25 Will recheck weight in 3-6 months     Labs pending Health maintenance reviewed Diet and exercise encouraged Continue all meds Follow up  In 3 month   [place], FNP

## 2015-11-08 NOTE — Patient Instructions (Signed)

## 2015-11-09 LAB — CMP14+EGFR
ALK PHOS: 45 IU/L (ref 39–117)
ALT: 29 IU/L (ref 0–44)
AST: 21 IU/L (ref 0–40)
Albumin/Globulin Ratio: 1.8 (ref 1.2–2.2)
Albumin: 4.6 g/dL (ref 3.6–4.8)
BUN/Creatinine Ratio: 21 (ref 10–24)
BUN: 22 mg/dL (ref 8–27)
Bilirubin Total: 0.3 mg/dL (ref 0.0–1.2)
CALCIUM: 9.6 mg/dL (ref 8.6–10.2)
CO2: 25 mmol/L (ref 18–29)
CREATININE: 1.05 mg/dL (ref 0.76–1.27)
Chloride: 99 mmol/L (ref 96–106)
GFR calc Af Amer: 87 mL/min/{1.73_m2} (ref >59)
GFR, EST NON AFRICAN AMERICAN: 75 mL/min/{1.73_m2} (ref >59)
GLOBULIN, TOTAL: 2.5 g/dL (ref 1.5–4.5)
GLUCOSE: 158 mg/dL — AB (ref 65–99)
Potassium: 4.3 mmol/L (ref 3.5–5.2)
SODIUM: 140 mmol/L (ref 134–144)
Total Protein: 7.1 g/dL (ref 6.0–8.5)

## 2015-11-09 LAB — LIPID PANEL
CHOL/HDL RATIO: 4.5 ratio (ref 0.0–5.0)
CHOLESTEROL TOTAL: 183 mg/dL (ref 100–199)
HDL: 41 mg/dL (ref >39)
LDL CALC: 107 mg/dL — AB (ref 0–99)
TRIGLYCERIDES: 176 mg/dL — AB (ref 0–149)
VLDL CHOLESTEROL CAL: 35 mg/dL (ref 5–40)

## 2015-12-26 ENCOUNTER — Encounter: Payer: Self-pay | Admitting: Family Medicine

## 2015-12-26 ENCOUNTER — Ambulatory Visit (INDEPENDENT_AMBULATORY_CARE_PROVIDER_SITE_OTHER): Payer: BLUE CROSS/BLUE SHIELD | Admitting: Family Medicine

## 2015-12-26 VITALS — BP 130/75 | HR 75 | Temp 97.8°F | Ht 69.0 in | Wt 200.0 lb

## 2015-12-26 DIAGNOSIS — L02512 Cutaneous abscess of left hand: Secondary | ICD-10-CM | POA: Diagnosis not present

## 2015-12-26 MED ORDER — SULFAMETHOXAZOLE-TRIMETHOPRIM 800-160 MG PO TABS: 1.0000 | ORAL_TABLET | Freq: Two times a day (BID) | ORAL | Status: DC

## 2015-12-26 NOTE — Progress Notes (Signed)
BP 130/75 mmHg  Pulse 75  Temp(Src) 97.8 F (36.6 C) (Oral)  Ht 5\' 9"  (1.753 m)  Wt 200 lb (90.719 kg)  BMI 29.52 kg/m2   Subjective:    Patient ID: Philip Huang, male    DOB: Nov 04, 1950, 65 y.o.   MRN: [id number]  HPI: Philip Huang is a 65 y.o. male presenting on 12/26/2015 for Sore on left hand   HPI Sore on left hand Patient has a sore on the dorsal aspect of his left hands on the second metacarpal near the MCP joint. He feels like he may have gotten a splinter in there a few months ago and then he has had this swelling and irritation and inflammation and intermittent drainage for the past few months. He's been soaking in Epsom salts but has not had much success with this. The drainage has been getting intermittently has been purulent and thick. His also had some numbness and tenderness around the site.  Relevant past medical, surgical, family and social history reviewed and updated as indicated. Interim medical history since our last visit reviewed. Allergies and medications reviewed and updated.  Review of Systems  Constitutional: Negative for fever and chills.  HENT: Negative for ear discharge and ear pain.   Eyes: Negative for discharge and visual disturbance.  Respiratory: Negative for shortness of breath and wheezing.   Cardiovascular: Negative for chest pain and leg swelling.  Gastrointestinal: Negative for abdominal pain, diarrhea and constipation.  Genitourinary: Negative for difficulty urinating.  Musculoskeletal: Negative for back pain and gait problem.  Skin: Positive for color change and wound. Negative for rash.  Neurological: Negative for syncope, light-headedness and headaches.  All other systems reviewed and are negative.   Per HPI unless specifically indicated above     Medication List       This list is accurate as of: 12/26/15  6:02 PM.  Always use your most recent med list.               aspirin 81 MG tablet  Take 325 mg by mouth  daily.     CRESTOR 20 MG tablet  Generic drug:  rosuvastatin  Take 1 tablet (20 mg total) by mouth daily.     fenofibrate 160 MG tablet  Take 1 tablet (160 mg total) by mouth daily.     fish oil-omega-3 fatty acids 1000 MG capsule  Take 1 g by mouth daily.     glucose blood test strip  Commonly known as:  BAYER CONTOUR NEXT TEST  Test qd. Dx E11.9     LIFESCAN UNISTIK 2 Misc  1 strip by Does not apply route daily. Test 1X per day-- dx 250.02     lisinopril-hydrochlorothiazide 20-12.5 MG tablet  Commonly known as:  PRINZIDE,ZESTORETIC  Take 2 tablets by mouth daily.     MENS 50+ ADVANCED PO  Take by mouth.     nitroGLYCERIN 0.4 MG SL tablet  Commonly known as:  NITROSTAT  Place 0.4 mg under the tongue every 5 (five) minutes as needed.     sitaGLIPtin-metformin 50-1000 MG tablet  Commonly known as:  JANUMET  Take 1 tablet by mouth 2 (two) times daily with a meal.     sulfamethoxazole-trimethoprim 800-160 MG tablet  Commonly known as:  BACTRIM DS  Take 1 tablet by mouth 2 (two) times daily.     triamcinolone cream 0.1 %  Commonly known as:  KENALOG  Apply 1 application topically 2 (two) times daily.  vitamin C 500 MG tablet  Commonly known as:  ASCORBIC ACID  Take 500 mg by mouth daily.     Vitamin D 2000 units tablet  Take 2,000 Units by mouth daily.           Objective:    BP 130/75 mmHg  Pulse 75  Temp(Src) 97.8 F (36.6 C) (Oral)  Ht 5\' 9"  (1.753 m)  Wt 200 lb (90.719 kg)  BMI 29.52 kg/m2  Wt Readings from Last 3 Encounters:  12/26/15 200 lb (90.719 kg)  11/08/15 198 lb (89.812 kg)  07/19/15 200 lb (90.719 kg)    Physical Exam  Constitutional: He is oriented to person, place, and time. He appears well-developed and well-nourished. No distress.  Eyes: Conjunctivae and EOM are normal. Pupils are equal, round, and reactive to light. Right eye exhibits no discharge. No scleral icterus.  Neck: Neck supple. No thyromegaly present.  Cardiovascular:  Normal rate, regular rhythm, normal heart sounds and intact distal pulses.   No murmur heard. Pulmonary/Chest: Effort normal and breath sounds normal. No respiratory distress. He has no wheezes.  Musculoskeletal: Normal range of motion. He exhibits no edema.  Lymphadenopathy:    He has no cervical adenopathy.  Neurological: He is alert and oriented to person, place, and time. Coordination normal.  Skin: Skin is warm and dry. Lesion (Raised quarter centimeter wide lesion on dorsum of second metatarsal near the MCP joint. Able to express purulent drainage and to culture. Unable to see foreign body despite pressure and opening lesion slightly with forceps.) noted. No rash noted. He is not diaphoretic.  Psychiatric: He has a normal mood and affect. His behavior is normal.  Nursing note and vitals reviewed.      Assessment & Plan:   Problem List Items Addressed This Visit    None    Visit Diagnoses    Abscess of left hand    -  Primary    Relevant Medications    sulfamethoxazole-trimethoprim (BACTRIM DS) 800-160 MG tablet    Other Relevant Orders    Anaerobic and Aerobic Culture       Follow up plan: Return if symptoms worsen or fail to improve.  Counseling provided for all of the vaccine components Orders Placed This Encounter  Procedures  . Anaerobic and Aerobic Culture    Caryl Pina, MD [facility] 12/26/2015, 6:02 PM

## 2015-12-29 ENCOUNTER — Telehealth: Payer: Self-pay | Admitting: Family Medicine

## 2015-12-29 MED ORDER — ONDANSETRON 4 MG PO TBDP: 4.0000 mg | ORAL_TABLET | Freq: Three times a day (TID) | ORAL | Status: DC | PRN

## 2015-12-29 NOTE — Telephone Encounter (Signed)
Send Zofran 20-  4mg  ODT and recommended for him to take [place] yogurt or probiotics and finish the course of Bactrim. Any antibiotic can make him feel similar to that

## 2015-12-29 NOTE — Telephone Encounter (Signed)
Patient aware.

## 2016-01-01 ENCOUNTER — Telehealth: Payer: Self-pay | Admitting: Family Medicine

## 2016-01-01 LAB — ANAEROBIC AND AEROBIC CULTURE

## 2016-01-01 MED ORDER — CIPROFLOXACIN HCL 500 MG PO TABS: 500.0000 mg | ORAL_TABLET | Freq: Two times a day (BID) | ORAL | Status: DC

## 2016-01-01 NOTE — Telephone Encounter (Signed)
Pt. Experiencing greater lassitude. Having chills and sweats. No appetite. Denies diarrhea, Having severe nausea in spite of use of the zofran. Not vomiting. No rash. Hand may be a little better. A: reaction to bactrim, not amenable with zofran. P: DC bactrim     Due to doxy allergy will use cipro 500 mg BID  Claretta Fraise, MD

## 2016-01-03 ENCOUNTER — Telehealth: Payer: Self-pay | Admitting: Family Medicine

## 2016-01-03 ENCOUNTER — Telehealth: Payer: Self-pay | Admitting: Nurse Practitioner

## 2016-01-03 ENCOUNTER — Ambulatory Visit (INDEPENDENT_AMBULATORY_CARE_PROVIDER_SITE_OTHER): Payer: BLUE CROSS/BLUE SHIELD | Admitting: Nurse Practitioner

## 2016-01-03 VITALS — BP 144/86 | HR 92 | Temp 98.7°F | Ht 69.0 in | Wt 200.0 lb

## 2016-01-03 DIAGNOSIS — Z9889 Other specified postprocedural states: Secondary | ICD-10-CM | POA: Diagnosis not present

## 2016-01-03 DIAGNOSIS — L02512 Cutaneous abscess of left hand: Secondary | ICD-10-CM

## 2016-01-03 NOTE — Telephone Encounter (Signed)
Patients wife aware that we will can see him if he can come on in. Wife will call us back to let us know if he can come in.

## 2016-01-03 NOTE — Progress Notes (Signed)
   Subjective:    Patient ID: Philip Huang, male    DOB: Dec 26, 1950, 65 y.o.   MRN: [id number]  HPI Patient in to be seen for infectious place on left hand- he has seen dr. Warrick Parisian for it originally and was given bactrim and told to do epsom salt soaks- the bactrim made him nauseous so zofran was called in- He then developed fine rash on Sunday and antibiotic was changeged to Cipro- when he awoken Monday morning the rash was worse so he stopped antibiotics all together- says that he can not get anything to drain out of area and it  Looks no better. Still raised and sore to touch- not sure that he doesn't have foreign object in it.    Review of Systems  Constitutional: Negative.   HENT: Negative.   Respiratory: Negative.   Cardiovascular: Negative.   Genitourinary: Negative.   Neurological: Negative.   Psychiatric/Behavioral: Negative.   All other systems reviewed and are negative.      Objective:   Physical Exam  Constitutional: He is oriented to person, place, and time. He appears well-developed and well-nourished. No distress.  Cardiovascular: Normal rate and normal heart sounds.   Pulmonary/Chest: Effort normal.  Neurological: He is alert and oriented to person, place, and time.  Skin: Skin is warm and dry.  3cm raised lesion with central scabbed area on left dorsal surface along fifth metacarpal area.   BP 144/86 mmHg  Pulse 92  Temp(Src) 98.7 F (37.1 C) (Oral)  Ht 5\' 9"  (1.753 m)  Wt 200 lb (90.719 kg)  BMI 29.52 kg/m2  Procedure:  Lidocaine 1% plain 4cc local  Cleaned with betadine  #11 blade to make 1cm incision  No pus exsanguinated  Explored entire area with no foreign body found  Irrigated with saline  Dressing applied      Assessment & Plan:   1. Abscess of left hand   2. S/P excisional debridement    Start back on cipro as rx Continue epsom salt soaks Keep clean and dry RTO prn  Mary-Margaret Hassell Done, FNP

## 2016-01-03 NOTE — Patient Instructions (Signed)
Incision and Drainage, Care After Refer to this sheet in the next few weeks. These instructions provide you with information on caring for yourself after your procedure. Your caregiver may also give you more specific instructions. Your treatment has been planned according to current medical practices, but problems sometimes occur. Call your caregiver if you have any problems or questions after your procedure. HOME CARE INSTRUCTIONS   If antibiotic medicine is given, take it as directed. Finish it even if you start to feel better.  Only take over-the-counter or prescription medicines for pain, discomfort, or fever as directed by your caregiver.  Keep all follow-up appointments as directed by your caregiver.  Change any bandages (dressings) as directed by your caregiver. Replace old dressings with clean dressings.  Wash your hands before and after caring for your wound. You will receive specific instructions for cleansing and caring for your wound.  SEEK MEDICAL CARE IF:   You have increased pain, swelling, or redness around the wound.  You have increased drainage, smell, or bleeding from the wound.  You have muscle aches, chills, or you feel generally sick.  You have a fever. MAKE SURE YOU:   Understand these instructions.  Will watch your condition.  Will get help right away if you are not doing well or get worse.   This information is not intended to replace advice given to you by your health care provider. Make sure you discuss any questions you have with your health care provider.   Document Released: 10/15/2011 Document Revised: 08/13/2014 Document Reviewed: 10/15/2011 Elsevier Interactive Patient Education 2016 Else[employer] Inc[employer]  

## 2016-01-03 NOTE — Telephone Encounter (Signed)
Patient was taking bactrim for infection of hand- that made him sick- took last dose of bactrim Sundayy- then was called in Cipro- took one dose of cipro and broke out in rash on MOnday morning. Not sure if rash was from bactrim he had been taking or the one dose of cipro he took. Has taken cipro in the past without problems- described rash as fine erythematous itchy rash, which sounds like allergic reaction to bactrim. Patient was told that he needs to take antibiotic in order for the hand to get better- It is already draining so no need to lance it. Patient will take benadryl for rash and will try continuing cipro. Epsom salt soaks and will see if if getting no better.

## 2016-01-23 ENCOUNTER — Telehealth: Payer: Self-pay | Admitting: Nurse Practitioner

## 2016-01-24 NOTE — Telephone Encounter (Signed)
PT needs to be seen

## 2016-01-24 NOTE — Telephone Encounter (Signed)
Left detailed mssg NTBS & to call back for appt.

## 2016-01-27 ENCOUNTER — Ambulatory Visit (INDEPENDENT_AMBULATORY_CARE_PROVIDER_SITE_OTHER): Payer: BLUE CROSS/BLUE SHIELD | Admitting: Family

## 2016-01-27 ENCOUNTER — Encounter: Payer: Self-pay | Admitting: Family

## 2016-01-27 VITALS — BP 128/72 | HR 71 | Temp 97.9°F | Ht 69.0 in | Wt 196.0 lb

## 2016-01-27 DIAGNOSIS — L02519 Cutaneous abscess of unspecified hand: Secondary | ICD-10-CM

## 2016-01-27 MED ORDER — AMOXICILLIN-POT CLAVULANATE 875-125 MG PO TABS: 1.0000 | ORAL_TABLET | Freq: Two times a day (BID) | ORAL | Status: DC

## 2016-01-27 NOTE — Patient Instructions (Signed)
Staphylococcal Infection WHAT IS A STAPHYLOCOCCAL INFECTION? Staphylococcus aureus, also known as staph, is a bacteria that can cause infections. The most common type of staph infection is a skin infection. Staph can spread easily from one person to another. HOW IS A STAPHYLOCOCCAL INFECTION DIAGNOSED? Your health care provider may diagnose and treat a staph infection based on an exam. He or she may also do a culture from the affected area:  To find out exactly what type of bacteria is causing your infection.  To decide which medicine is best to treat it. HOW IS A STAPHYLOCOCCAL INFECTION TREATED? Most staph infections can be easily treated with antibiotic medicine. Some infections must be drained. More serious types of staph, including methicillin-resistant Staphylococcus aureus (MRSA) may be more difficult to treat. They may require a strong antibiotic or more than one antibiotic. WHAT SHOULD I DO AT HOME?  Take your antibiotic as directed by your health care provider. Finish your antibiotic even if you start to feel better.  Keep all follow-up visits as directed by your health care provider.  Tell all of your health care providers including dentists that you have a staph infection, especially if you have been diagnosed with MRSA.  Ask your health care provider when it is safe for you to return to school or work.  To prevent spreading the infection:  Keep infections and pus or drainage material covered with clean, dry bandages (dressings), or as directed by your health care provider.  Wash your hands with soap and water often, especially after changing dressings or touching your infection.  Advise your family and other close contacts to wash their hands frequently with soap and water. They should always wash their hands after changing your dressings, touching the infected area, or touching infected materials.  Avoid sharing personal items that may have had contact with your infection.  These include towels, washcloths, razors, clothing, and uniforms.  Wash your linens and clothes with hot water and laundry detergent. Drying clothes in a hot dryer--rather than air-drying--also helps to kill bacteria in clothes. [place] PROVIDER? Although they are usually easy to treat, some staph infections can become very serious. You should contact your health care provider if your infection gets worse or if you have a fever.   This information is not intended to replace advice given to you by your health care provider. Make sure you discuss any questions you have with your health care provider.   Document Released: 08/13/2014 Document Reviewed: 08/13/2014 Elsevier Interactive Patient Education [employer].

## 2016-01-27 NOTE — Progress Notes (Signed)
   Subjective:    Patient ID: Philip Huang, male    DOB: 04-13-1951, 65 y.o.   MRN: [id number]  HPI PT presents to the office today with recurrent hand infection. PT states about 3 months ago he noticed it and has had "cut opened" and given bactrim. PT then developed a rash so he was then started on cipro 500 mg for 10 days. PT states he soaks it every night in epsom salt. PT states he has intermittent pain of 10 out 10. PT states it  +   Review of Systems  Constitutional: Negative.   HENT: Negative.   Respiratory: Negative.   Cardiovascular: Negative.   Gastrointestinal: Negative.   Endocrine: Negative.   Genitourinary: Negative.   Musculoskeletal: Negative.   Neurological: Negative.   Hematological: Negative.   Psychiatric/Behavioral: Negative.   All other systems reviewed and are negative.      Objective:   Physical Exam  Constitutional: He is oriented to person, place, and time. He appears well-developed and well-nourished. No distress.  HENT:  Head: Normocephalic.  Eyes: Pupils are equal, round, and reactive to light.  Cardiovascular: Normal rate, regular rhythm, normal heart sounds and intact distal pulses.   No murmur heard. Pulmonary/Chest: Effort normal and breath sounds normal. No respiratory distress. He has no wheezes.  Abdominal: Soft. Bowel sounds are normal. He exhibits no distension. There is no tenderness.  Musculoskeletal: Normal range of motion. He exhibits no edema or tenderness.  Neurological: He is alert and oriented to person, place, and time.  Skin: Skin is warm and dry. No rash noted. No erythema.  Psychiatric: He has a normal mood and affect. His behavior is normal. Judgment and thought content normal.  Vitals reviewed.     BP 128/72 mmHg  Pulse 71  Temp(Src) 97.9 F (36.6 C) (Oral)  Ht 5\' 9"  (1.753 m)  Wt 196 lb (88.905 kg)  BMI 28.93 kg/m2     Assessment & Plan:  1. Abscess, hand -PT started on augmenting today -Continue epsom  salt soaks Keep clean and dry RTO 10 days - amoxicillin-clavulanate (AUGMENTIN) 875-125 MG tablet; Take 1 tablet by mouth 2 (two) times daily.  Dispense: 14 tablet; Refill: 0  Evelina Dun, FNP

## 2016-02-03 ENCOUNTER — Other Ambulatory Visit: Payer: Self-pay | Admitting: Nurse Practitioner

## 2016-02-03 ENCOUNTER — Telehealth: Payer: Self-pay | Admitting: Nurse Practitioner

## 2016-02-03 NOTE — Telephone Encounter (Signed)
Spoke with patient and they brought over form to be sent in to get from company

## 2016-02-13 ENCOUNTER — Encounter: Payer: Self-pay | Admitting: Family Medicine

## 2016-02-13 ENCOUNTER — Ambulatory Visit (INDEPENDENT_AMBULATORY_CARE_PROVIDER_SITE_OTHER): Payer: BLUE CROSS/BLUE SHIELD | Admitting: Family Medicine

## 2016-02-13 ENCOUNTER — Encounter: Payer: Self-pay | Admitting: Nurse Practitioner

## 2016-02-13 VITALS — BP 137/78 | HR 73 | Temp 98.1°F | Ht 69.0 in | Wt 195.6 lb

## 2016-02-13 DIAGNOSIS — L02512 Cutaneous abscess of left hand: Secondary | ICD-10-CM | POA: Diagnosis not present

## 2016-02-13 MED ORDER — AMOXICILLIN-POT CLAVULANATE 875-125 MG PO TABS: 1.0000 | ORAL_TABLET | Freq: Two times a day (BID) | ORAL | Status: DC

## 2016-02-13 NOTE — Progress Notes (Signed)
BP 137/78 mmHg  Pulse 73  Temp(Src) 98.1 F (36.7 C) (Oral)  Ht 5\' 9"  (1.753 m)  Wt 195 lb 9.6 oz (88.724 kg)  BMI 28.87 kg/m2   Subjective:    Patient ID: Tabius Torain, male    DOB: June 26, 1951, 65 y.o.   MRN: [id number]  HPI: Milus Rahlf is a 65 y.o. male presenting on 02/13/2016 for Recurrent Skin Infections   HPI Recurrent abscess Patient has had a recurrent abscess despite 3 courses of antibiotics and to times of local I&D. This time it started back up about a week ago and he is starting to have more swelling and inflammation and redness and warmth and pain and purulent drainage out of the site. He has it on the dorsum of his left hand overlying the tendon of the fifth carpal bone. He denies any fevers or chills or decreased strength or sensation.  Relevant past medical, surgical, family and social history reviewed and updated as indicated. Interim medical history since our last visit reviewed. Allergies and medications reviewed and updated.  Review of Systems  Constitutional: Negative for fever and chills.  HENT: Negative for ear discharge and ear pain.   Eyes: Negative for discharge and visual disturbance.  Respiratory: Negative for shortness of breath and wheezing.   Cardiovascular: Negative for chest pain and leg swelling.  Gastrointestinal: Negative for abdominal pain, diarrhea and constipation.  Genitourinary: Negative for difficulty urinating.  Musculoskeletal: Negative for back pain and gait problem.  Skin: Positive for color change and wound. Negative for rash.  Neurological: Negative for syncope, light-headedness and headaches.  All other systems reviewed and are negative.   Per HPI unless specifically indicated above     Medication List       This list is accurate as of: 02/13/16  6:09 PM.  Always use your most recent med list.               amoxicillin-clavulanate 875-125 MG tablet  Commonly known as:  AUGMENTIN  Take 1 tablet by mouth 2  (two) times daily.     aspirin 81 MG tablet  Take 325 mg by mouth daily.     CRESTOR 20 MG tablet  Generic drug:  rosuvastatin  Take 1 tablet (20 mg total) by mouth daily.     fenofibrate 160 MG tablet  Take 1 tablet (160 mg total) by mouth daily.     fish oil-omega-3 fatty acids 1000 MG capsule  Take 1 g by mouth daily.     glipiZIDE 10 MG 24 hr tablet  Commonly known as:  GLUCOTROL XL     glucose blood test strip  Commonly known as:  BAYER CONTOUR NEXT TEST  Test qd. Dx E11.9     LIFESCAN UNISTIK 2 Misc  1 strip by Does not apply route daily. Test 1X per day-- dx 250.02     lisinopril-hydrochlorothiazide 20-12.5 MG tablet  Commonly known as:  PRINZIDE,ZESTORETIC  Take 2 tablets by mouth daily.     MENS 50+ ADVANCED PO  Take by mouth.     nitroGLYCERIN 0.4 MG SL tablet  Commonly known as:  NITROSTAT  Place 0.4 mg under the tongue every 5 (five) minutes as needed.     ondansetron 4 MG disintegrating tablet  Commonly known as:  ZOFRAN ODT  Take 1 tablet (4 mg total) by mouth every 8 (eight) hours as needed for nausea or vomiting.     sitaGLIPtin-metformin 50-1000 MG tablet  Commonly known as:  JANUMET  Take 1 tablet by mouth 2 (two) times daily with a meal.     triamcinolone cream 0.1 %  Commonly known as:  KENALOG  Apply 1 application topically 2 (two) times daily.     vitamin C 500 MG tablet  Commonly known as:  ASCORBIC ACID  Take 500 mg by mouth daily.     Vitamin D 2000 units tablet  Take 2,000 Units by mouth daily.           Objective:    BP 137/78 mmHg  Pulse 73  Temp(Src) 98.1 F (36.7 C) (Oral)  Ht 5\' 9"  (1.753 m)  Wt 195 lb 9.6 oz (88.724 kg)  BMI 28.87 kg/m2  Wt Readings from Last 3 Encounters:  02/13/16 195 lb 9.6 oz (88.724 kg)  01/27/16 196 lb (88.905 kg)  01/03/16 200 lb (90.719 kg)    Physical Exam  Constitutional: He is oriented to person, place, and time. He appears well-developed and well-nourished. No distress.  Eyes:  Conjunctivae and EOM are normal. Pupils are equal, round, and reactive to light. Right eye exhibits no discharge. No scleral icterus.  Neck: Neck supple. No thyromegaly present.  Cardiovascular: Normal rate, regular rhythm, normal heart sounds and intact distal pulses.   No murmur heard. Pulmonary/Chest: Effort normal and breath sounds normal. No respiratory distress. He has no wheezes.  Musculoskeletal: Normal range of motion. He exhibits no edema.  Lymphadenopathy:    He has no cervical adenopathy.  Neurological: He is alert and oriented to person, place, and time. Coordination normal.  Skin: Skin is warm and dry. Lesion (Swelling and erythema in 0.25 cm area over lying his fifth carpal bone on the dorsum of his left hand. Able to express purulent drainage and patient has tenderness to palpation. Sensation and capillary refill intact and range of motion intact) noted. No rash noted. He is not diaphoretic. There is erythema.  Psychiatric: He has a normal mood and affect. His behavior is normal.  Nursing note and vitals reviewed.   Results for orders placed or performed in visit on 12/26/15  Anaerobic and Aerobic Culture  Result Value Ref Range   Anaerobic Culture Final report    Result 1 Comment    Aerobic Culture Final report (A)    Result 1 Staphylococcus aureus (A)    Antimicrobial Susceptibility Comment       Assessment & Plan:   Problem List Items Addressed This Visit    None    Visit Diagnoses    Abscess of left hand    -  Primary    Patient has recurrent abscess on left hand, is draining again and swelling up, repeat antibiotic, concern for possible foreign body, send to surgery    Relevant Medications    amoxicillin-clavulanate (AUGMENTIN) 875-125 MG tablet    Other Relevant Orders    Ambulatory referral to General Surgery        Follow up plan: Return if symptoms worsen or fail to improve.  Counseling provided for all of the vaccine components Orders Placed This  Encounter  Procedures  . Ambulatory referral to [name], MD [facility] 02/13/2016, 6:09 PM

## 2016-02-18 ENCOUNTER — Telehealth: Payer: Self-pay | Admitting: Nurse Practitioner

## 2016-02-20 DIAGNOSIS — L02512 Cutaneous abscess of left hand: Secondary | ICD-10-CM | POA: Insufficient documentation

## 2016-03-01 ENCOUNTER — Telehealth: Payer: Self-pay | Admitting: Nurse Practitioner

## 2016-03-02 DIAGNOSIS — D0462 Carcinoma in situ of skin of left upper limb, including shoulder: Secondary | ICD-10-CM | POA: Insufficient documentation

## 2016-03-23 ENCOUNTER — Ambulatory Visit (INDEPENDENT_AMBULATORY_CARE_PROVIDER_SITE_OTHER): Payer: BLUE CROSS/BLUE SHIELD | Admitting: Family Medicine

## 2016-03-23 ENCOUNTER — Encounter: Payer: Self-pay | Admitting: Family Medicine

## 2016-03-23 VITALS — BP 128/74 | HR 76 | Temp 98.0°F | Ht 69.0 in | Wt 194.0 lb

## 2016-03-23 DIAGNOSIS — E785 Hyperlipidemia, unspecified: Secondary | ICD-10-CM | POA: Diagnosis not present

## 2016-03-23 DIAGNOSIS — E119 Type 2 diabetes mellitus without complications: Secondary | ICD-10-CM

## 2016-03-23 DIAGNOSIS — I159 Secondary hypertension, unspecified: Secondary | ICD-10-CM

## 2016-03-23 LAB — BAYER DCA HB A1C WAIVED: HB A1C (BAYER DCA - WAIVED): 8.6 % — ABNORMAL HIGH (ref <7.0)

## 2016-03-23 MED ORDER — CRESTOR 20 MG PO TABS: 20.0000 mg | ORAL_TABLET | Freq: Every day | ORAL | 5 refills | Status: DC

## 2016-03-23 MED ORDER — FENOFIBRATE 160 MG PO TABS: 160.0000 mg | ORAL_TABLET | Freq: Every day | ORAL | 5 refills | Status: DC

## 2016-03-23 MED ORDER — GLIPIZIDE ER 10 MG PO TB24: 10.0000 mg | ORAL_TABLET | Freq: Every day | ORAL | 4 refills | Status: DC

## 2016-03-23 MED ORDER — NITROGLYCERIN 0.4 MG SL SUBL: 0.4000 mg | SUBLINGUAL_TABLET | SUBLINGUAL | 1 refills | Status: DC | PRN

## 2016-03-23 MED ORDER — LISINOPRIL-HYDROCHLOROTHIAZIDE 20-12.5 MG PO TABS: 2.0000 | ORAL_TABLET | Freq: Every day | ORAL | 5 refills | Status: DC

## 2016-03-23 NOTE — Progress Notes (Signed)
BP 128/74   Pulse 76   Temp 98 F (36.7 C) (Oral)   Ht 5\' 9"  (1.753 m)   Wt 194 lb (88 kg)   BMI 28.65 kg/m    Subjective:    Patient ID: Philip Huang, male    DOB: 12-16-1950, 65 y.o.   MRN: [id number]  HPI: Philip Huang is a 65 y.o. male presenting on 03/23/2016 for Diabetes (have A1C today)   HPI Type 2 diabetes Patient is coming in for recheck of his type 2 diabetes today. He is currently on glipizide and Janumet. He says that he takes them regularly and does not miss doses. He denies any issues with either medication. Patient is currently on an ACE inhibitor. Patient is also on a statin. He has not seen an ophthalmologist yet this year. He is also due for foot exam.  Hypertension hyperlipidemia Patient is coming in for refills on hypertension and hyperlipidemia. He denies any issues with his medications. His blood pressure today is 128/74. Patient denies headaches, blurred vision, chest pains, shortness of breath, or weakness. Denies any side effects from medication and is content with current medication.   Relevant past medical, surgical, family and social history reviewed and updated as indicated. Interim medical history since our last visit reviewed. Allergies and medications reviewed and updated.  Review of Systems  Constitutional: Negative for fever.  HENT: Negative for ear discharge and ear pain.   Eyes: Negative for discharge and visual disturbance.  Respiratory: Negative for shortness of breath and wheezing.   Cardiovascular: Negative for chest pain and leg swelling.  Gastrointestinal: Negative for abdominal pain, constipation and diarrhea.  Genitourinary: Negative for difficulty urinating.  Musculoskeletal: Negative for back pain and gait problem.  Skin: Negative for rash.  Neurological: Negative for syncope, light-headedness and headaches.  All other systems reviewed and are negative.   Per HPI unless specifically indicated above     Medication List        Accurate as of 03/23/16  5:23 PM. Always use your most recent med list.          aspirin 81 MG tablet Take 325 mg by mouth daily.   CRESTOR 20 MG tablet Generic drug:  rosuvastatin Take 1 tablet (20 mg total) by mouth daily.   fenofibrate 160 MG tablet Take 1 tablet (160 mg total) by mouth daily.   fish oil-omega-3 fatty acids 1000 MG capsule Take 1 g by mouth daily.   glipiZIDE 10 MG 24 hr tablet Commonly known as:  GLUCOTROL XL Take 1 tablet (10 mg total) by mouth daily with breakfast.   glucose blood test strip Commonly known as:  BAYER CONTOUR NEXT TEST Test qd. Dx E11.9   LIFESCAN UNISTIK 2 Misc 1 strip by Does not apply route daily. Test 1X per day-- dx 250.02   lisinopril-hydrochlorothiazide 20-12.5 MG tablet Commonly known as:  PRINZIDE,ZESTORETIC Take 2 tablets by mouth daily.   MENS 50+ ADVANCED PO Take by mouth.   nitroGLYCERIN 0.4 MG SL tablet Commonly known as:  NITROSTAT Place 1 tablet (0.4 mg total) under the tongue every 5 (five) minutes as needed.   sitaGLIPtin-metformin 50-1000 MG tablet Commonly known as:  JANUMET Take 1 tablet by mouth 2 (two) times daily with a meal.   triamcinolone cream 0.1 % Commonly known as:  KENALOG Apply 1 application topically 2 (two) times daily.   vitamin C 500 MG tablet Commonly known as:  ASCORBIC ACID Take 500 mg by mouth daily.   Vitamin  D 2000 units tablet Take 2,000 Units by mouth daily.          Objective:    BP 128/74   Pulse 76   Temp 98 F (36.7 C) (Oral)   Ht 5\' 9"  (1.753 m)   Wt 194 lb (88 kg)   BMI 28.65 kg/m   Wt Readings from Last 3 Encounters:  03/23/16 194 lb (88 kg)  02/13/16 195 lb 9.6 oz (88.7 kg)  01/27/16 196 lb (88.9 kg)    Physical Exam  Constitutional: He is oriented to person, place, and time. He appears well-developed and well-nourished. No distress.  Eyes: Conjunctivae and EOM are normal. Pupils are equal, round, and reactive to light. Right eye exhibits no  discharge. No scleral icterus.  Neck: Neck supple. No thyromegaly present.  Cardiovascular: Normal rate, regular rhythm, normal heart sounds and intact distal pulses.   No murmur heard. Pulmonary/Chest: Effort normal and breath sounds normal. No respiratory distress. He has no wheezes.  Musculoskeletal: Normal range of motion. He exhibits no edema.  Lymphadenopathy:    He has no cervical adenopathy.  Neurological: He is alert and oriented to person, place, and time. Coordination normal.  Skin: Skin is warm and dry. No rash noted. He is not diaphoretic.  Psychiatric: He has a normal mood and affect. His behavior is normal.  Nursing note and vitals reviewed.     Assessment & Plan:   Problem List Items Addressed This Visit      Cardiovascular and Mediastinum   Hypertension (Chronic)   Relevant Medications   nitroGLYCERIN (NITROSTAT) 0.4 MG SL tablet   lisinopril-hydrochlorothiazide (PRINZIDE,ZESTORETIC) 20-12.5 MG tablet   fenofibrate 160 MG tablet   CRESTOR 20 MG tablet     Endocrine   Type II diabetes mellitus (HCC) - Primary (Chronic)   Relevant Medications   lisinopril-hydrochlorothiazide (PRINZIDE,ZESTORETIC) 20-12.5 MG tablet   glipiZIDE (GLUCOTROL XL) 10 MG 24 hr tablet   CRESTOR 20 MG tablet   Other Relevant Orders   [facility] DCA Hb A1c Waived (Completed)     Other   Hyperlipidemia (Chronic)   Relevant Medications   nitroGLYCERIN (NITROSTAT) 0.4 MG SL tablet   lisinopril-hydrochlorothiazide (PRINZIDE,ZESTORETIC) 20-12.5 MG tablet   fenofibrate 160 MG tablet   CRESTOR 20 MG tablet    Other Visit Diagnoses   None.      Follow up plan: Return in about 3 months (around 06/23/2016), or if symptoms worsen or fail to improve, for Recheck diabetes and cholesterol and hypertension.  Counseling provided for all of the vaccine components Orders Placed This Encounter  Procedures  . [facility] Hb A1c [place], MD [facility] 03/23/2016, 5:23 PM

## 2016-03-26 ENCOUNTER — Other Ambulatory Visit: Payer: Self-pay | Admitting: *Deleted

## 2016-03-26 MED ORDER — EMPAGLIFLOZIN 10 MG PO TABS: 10.0000 mg | ORAL_TABLET | Freq: Every day | ORAL | 0 refills | Status: DC

## 2016-04-07 ENCOUNTER — Encounter (INDEPENDENT_AMBULATORY_CARE_PROVIDER_SITE_OTHER): Payer: Self-pay

## 2016-05-11 ENCOUNTER — Other Ambulatory Visit: Payer: Self-pay | Admitting: Nurse Practitioner

## 2016-05-11 DIAGNOSIS — E119 Type 2 diabetes mellitus without complications: Secondary | ICD-10-CM

## 2016-05-14 MED ORDER — SITAGLIPTIN PHOS-METFORMIN HCL 50-1000 MG PO TABS: 1.0000 | ORAL_TABLET | Freq: Two times a day (BID) | ORAL | 1 refills | Status: DC

## 2016-05-14 NOTE — Telephone Encounter (Signed)
done

## 2016-06-13 ENCOUNTER — Other Ambulatory Visit: Payer: Self-pay | Admitting: Family Medicine

## 2016-07-02 ENCOUNTER — Other Ambulatory Visit: Payer: Self-pay | Admitting: Nurse Practitioner

## 2016-07-02 DIAGNOSIS — E785 Hyperlipidemia, unspecified: Secondary | ICD-10-CM

## 2016-07-28 ENCOUNTER — Other Ambulatory Visit: Payer: Self-pay | Admitting: Family Medicine

## 2016-07-28 ENCOUNTER — Other Ambulatory Visit: Payer: Self-pay | Admitting: Nurse Practitioner

## 2016-07-28 DIAGNOSIS — E119 Type 2 diabetes mellitus without complications: Secondary | ICD-10-CM

## 2016-08-02 ENCOUNTER — Ambulatory Visit: Payer: BLUE CROSS/BLUE SHIELD | Admitting: Family Medicine

## 2016-08-03 ENCOUNTER — Ambulatory Visit (INDEPENDENT_AMBULATORY_CARE_PROVIDER_SITE_OTHER): Payer: BLUE CROSS/BLUE SHIELD | Admitting: Family Medicine

## 2016-08-03 ENCOUNTER — Encounter: Payer: Self-pay | Admitting: Family Medicine

## 2016-08-03 VITALS — BP 138/76 | HR 62 | Temp 98.2°F | Ht 69.0 in | Wt 192.5 lb

## 2016-08-03 DIAGNOSIS — E119 Type 2 diabetes mellitus without complications: Secondary | ICD-10-CM | POA: Diagnosis not present

## 2016-08-03 DIAGNOSIS — E782 Mixed hyperlipidemia: Secondary | ICD-10-CM | POA: Diagnosis not present

## 2016-08-03 DIAGNOSIS — I10 Essential (primary) hypertension: Secondary | ICD-10-CM

## 2016-08-03 DIAGNOSIS — E785 Hyperlipidemia, unspecified: Secondary | ICD-10-CM

## 2016-08-03 LAB — LIPID PANEL
CHOLESTEROL TOTAL: 201 mg/dL — AB (ref 100–199)
Chol/HDL Ratio: 4.9 ratio units (ref 0.0–5.0)
HDL: 41 mg/dL (ref >39)
LDL Calculated: 128 mg/dL — ABNORMAL HIGH (ref 0–99)
Triglycerides: 162 mg/dL — ABNORMAL HIGH (ref 0–149)
VLDL CHOLESTEROL CAL: 32 mg/dL (ref 5–40)

## 2016-08-03 LAB — CMP14+EGFR
ALBUMIN: 4.8 g/dL (ref 3.6–4.8)
ALK PHOS: 42 IU/L (ref 39–117)
ALT: 35 IU/L (ref 0–44)
AST: 26 IU/L (ref 0–40)
Albumin/Globulin Ratio: 1.7 (ref 1.2–2.2)
BILIRUBIN TOTAL: 0.4 mg/dL (ref 0.0–1.2)
BUN / CREAT RATIO: 22 (ref 10–24)
BUN: 25 mg/dL (ref 8–27)
CHLORIDE: 101 mmol/L (ref 96–106)
CO2: 22 mmol/L (ref 18–29)
Calcium: 10.2 mg/dL (ref 8.6–10.2)
Creatinine, Ser: 1.13 mg/dL (ref 0.76–1.27)
GFR calc Af Amer: 79 mL/min/{1.73_m2} (ref >59)
GFR calc non Af Amer: 68 mL/min/{1.73_m2} (ref >59)
GLUCOSE: 116 mg/dL — AB (ref 65–99)
Globulin, Total: 2.8 g/dL (ref 1.5–4.5)
Potassium: 4.8 mmol/L (ref 3.5–5.2)
Sodium: 142 mmol/L (ref 134–144)
Total Protein: 7.6 g/dL (ref 6.0–8.5)

## 2016-08-03 LAB — BAYER DCA HB A1C WAIVED: HB A1C: 7.4 % — AB (ref <7.0)

## 2016-08-03 NOTE — Progress Notes (Signed)
BP 138/76   Pulse 62   Temp 98.2 F (36.8 C) (Oral)   Ht 5' 9"  (1.753 m)   Wt 192 lb 8 oz (87.3 kg)   BMI 28.43 kg/m    Subjective:    Patient ID: Philip Huang, male    DOB: [birth year], 65 y.o.   MRN: [id number]  HPI: Philip Huang is a 65 y.o. male presenting on 08/03/2016 for Diabetes (3 month followup, patient is fasting); Hyperlipidemia; Hypertension; and Feet have been peeling (saw the dermatologist 2 weeks ago and was prescribed a compound of Salicylic acid to use)   HPI Diabetes recheck Type 2 diabetes recheck today. Patient was added on to the Jardine's last time he was seen. He is still on Janumet and glipizide and is doing well on these medications. He says his blood sugars have been running much better between 100-180. He denies any issues with this medication. He has been getting his medication through a free service through [employer] and that has been helping him afford it. He has not seen an ophthalmologist this year. He is currently on an ACE inhibitor. He is also on a statin. He is seen a dermatologist for some issues with healing of his feet and is currently on salicylic acid but denies any cracking or open sores.  Hypertension recheck Patient is coming in for hypertension recheck today. His blood pressure is 138/76. He is currently on lisinopril-hydrochlorothiazide. Patient denies headaches, blurred vision, chest pains, shortness of breath, or weakness. Denies any side effects from medication and is content with current medication.   Hyperlipidemia Patient is coming in for cholesterol recheck as well today. He says he is fasting and we'll do fasting labs today. He is currently on Crestor for his cholesterol and denies any major issues with the. He denies any focal numbness or weakness.  Relevant past medical, surgical, family and social history reviewed and updated as indicated. Interim medical history since our last visit reviewed. Allergies and medications reviewed  and updated.  Review of Systems  Constitutional: Negative for chills and fever.  Respiratory: Negative for shortness of breath and wheezing.   Cardiovascular: Negative for chest pain and leg swelling.  Musculoskeletal: Negative for back pain and gait problem.  Skin: Negative for rash.  Neurological: Negative for dizziness, weakness, numbness and headaches.  All other systems reviewed and are negative.   Per HPI unless specifically indicated above      Objective:    BP 138/76   Pulse 62   Temp 98.2 F (36.8 C) (Oral)   Ht 5' 9"  (1.753 m)   Wt 192 lb 8 oz (87.3 kg)   BMI 28.43 kg/m   Wt Readings from Last 3 Encounters:  08/03/16 192 lb 8 oz (87.3 kg)  03/23/16 194 lb (88 kg)  02/13/16 195 lb 9.6 oz (88.7 kg)    Physical Exam  Constitutional: He is oriented to person, place, and time. He appears well-developed and well-nourished. No distress.  Eyes: Conjunctivae are normal. Right eye exhibits no discharge. Left eye exhibits no discharge. No scleral icterus.  Cardiovascular: Normal rate, regular rhythm, normal heart sounds and intact distal pulses.   No murmur heard. Pulmonary/Chest: Effort normal and breath sounds normal. No respiratory distress. He has no wheezes. He has no rales.  Musculoskeletal: Normal range of motion. He exhibits no edema.  Neurological: He is alert and oriented to person, place, and time. Coordination normal.  Skin: Skin is warm and dry. No rash noted. He is not  diaphoretic.  Psychiatric: He has a normal mood and affect. His behavior is normal.  Nursing note and vitals reviewed.  Diabetic Foot Exam - Simple   Simple Foot Form Diabetic Foot exam was performed with the following findings:  Yes 08/03/2016  9:21 AM  Visual Inspection See comments:  Yes Sensation Testing Intact to touch and monofilament testing bilaterally:  Yes Pulse Check Posterior Tibialis and Dorsalis pulse intact bilaterally:  Yes Comments Patient has a scaliness on his feet  that he is using solace Uloric acid 4 being treated by dermatologist, no open cracks her wounds are noted.        Assessment & Plan:   Problem List Items Addressed This Visit      Cardiovascular and Mediastinum   Hypertension - Primary (Chronic)   Relevant Orders   CMP14+EGFR     Endocrine   Type II diabetes mellitus ([place]) (Chronic)   Relevant Orders   Bayer DCA Hb A1c Waived   CMP14+EGFR   Ambulatory referral to Ophthalmology     Other   Hyperlipidemia (Chronic)   Relevant Orders   Lipid panel       Follow up plan: Return if symptoms worsen or fail to improve.  Counseling provided for all of the vaccine components Orders Placed This Encounter  Procedures  . Bayer DCA Hb A1c Waived  . CMP14+EGFR  . Lipid panel    Caryl Pina, MD [facility] 08/03/2016, 9:22 AM

## 2016-08-08 MED ORDER — ROSUVASTATIN CALCIUM 40 MG PO TABS: 40.0000 mg | ORAL_TABLET | Freq: Every day | ORAL | 1 refills | Status: DC

## 2016-08-08 NOTE — Addendum Note (Signed)
Addended by: Thana Ates on: 08/08/2016 12:14 PM   Modules accepted: Orders

## 2016-08-14 ENCOUNTER — Telehealth: Payer: Self-pay

## 2016-08-15 NOTE — Telephone Encounter (Signed)
Please cancel eye doctor appointment

## 2016-08-16 LAB — HM DIABETES EYE EXAM

## 2016-08-30 ENCOUNTER — Other Ambulatory Visit: Payer: Self-pay | Admitting: Nurse Practitioner

## 2016-08-30 DIAGNOSIS — E785 Hyperlipidemia, unspecified: Secondary | ICD-10-CM

## 2016-09-05 ENCOUNTER — Telehealth: Payer: Self-pay | Admitting: Family Medicine

## 2016-09-05 NOTE — Telephone Encounter (Signed)
Clarification to pharmacy

## 2016-09-23 ENCOUNTER — Other Ambulatory Visit: Payer: Self-pay | Admitting: Pediatrics

## 2016-10-09 ENCOUNTER — Other Ambulatory Visit: Payer: Self-pay | Admitting: Nurse Practitioner

## 2016-10-28 ENCOUNTER — Other Ambulatory Visit: Payer: Self-pay | Admitting: Family Medicine

## 2016-10-28 ENCOUNTER — Other Ambulatory Visit: Payer: Self-pay | Admitting: Pediatrics

## 2016-10-28 DIAGNOSIS — E785 Hyperlipidemia, unspecified: Secondary | ICD-10-CM

## 2016-10-29 ENCOUNTER — Other Ambulatory Visit: Payer: Self-pay | Admitting: Nurse Practitioner

## 2016-10-29 ENCOUNTER — Other Ambulatory Visit: Payer: Self-pay | Admitting: *Deleted

## 2016-10-29 DIAGNOSIS — E785 Hyperlipidemia, unspecified: Secondary | ICD-10-CM

## 2016-10-29 DIAGNOSIS — E7849 Other hyperlipidemia: Secondary | ICD-10-CM

## 2016-10-29 MED ORDER — FENOFIBRATE 160 MG PO TABS: 160.0000 mg | ORAL_TABLET | Freq: Every day | ORAL | 5 refills | Status: DC

## 2016-10-29 NOTE — Telephone Encounter (Signed)
What is the name of the medication? Fenofibrate 160 mg  Have you contacted your pharmacy to request a refill? yes  Which pharmacy would you like this sent to? [facility] [place]   Patient notified that their request is being sent to the clinical staff for review and that they should receive a call once it is complete. If they do not receive a call within 24 hours they can check with their pharmacy or our office.

## 2016-11-28 ENCOUNTER — Other Ambulatory Visit: Payer: Self-pay | Admitting: *Deleted

## 2016-11-28 MED ORDER — GLIPIZIDE ER 10 MG PO TB24
10.0000 mg | ORAL_TABLET | Freq: Every day | ORAL | 1 refills | Status: DC
Start: 2016-11-28 — End: 2017-06-08

## 2016-12-27 ENCOUNTER — Other Ambulatory Visit: Payer: Self-pay

## 2016-12-27 ENCOUNTER — Ambulatory Visit (INDEPENDENT_AMBULATORY_CARE_PROVIDER_SITE_OTHER): Payer: BLUE CROSS/BLUE SHIELD | Admitting: Family Medicine

## 2016-12-27 ENCOUNTER — Encounter: Payer: Self-pay | Admitting: Family Medicine

## 2016-12-27 VITALS — BP 130/74 | HR 64 | Temp 97.9°F | Ht 69.0 in | Wt 191.0 lb

## 2016-12-27 DIAGNOSIS — E782 Mixed hyperlipidemia: Secondary | ICD-10-CM | POA: Diagnosis not present

## 2016-12-27 DIAGNOSIS — E119 Type 2 diabetes mellitus without complications: Secondary | ICD-10-CM | POA: Diagnosis not present

## 2016-12-27 DIAGNOSIS — Z114 Encounter for screening for human immunodeficiency virus [HIV]: Secondary | ICD-10-CM | POA: Diagnosis not present

## 2016-12-27 DIAGNOSIS — E785 Hyperlipidemia, unspecified: Secondary | ICD-10-CM

## 2016-12-27 DIAGNOSIS — I10 Essential (primary) hypertension: Secondary | ICD-10-CM | POA: Diagnosis not present

## 2016-12-27 LAB — BAYER DCA HB A1C WAIVED: HB A1C (BAYER DCA - WAIVED): 6.8 % (ref <7.0)

## 2016-12-27 MED ORDER — EMPAGLIFLOZIN 10 MG PO TABS: 10.0000 mg | ORAL_TABLET | Freq: Every day | ORAL | 0 refills | Status: DC

## 2016-12-27 MED ORDER — ROSUVASTATIN CALCIUM 40 MG PO TABS: 40.0000 mg | ORAL_TABLET | Freq: Every day | ORAL | 1 refills | Status: DC

## 2016-12-27 NOTE — Progress Notes (Signed)
BP 130/74   Pulse 64   Temp 97.9 F (36.6 C) (Oral)   Ht 5' 9"  (1.753 m)   Wt 191 lb (86.6 kg)   BMI 28.21 kg/m    Subjective:    Patient ID: Philip Huang, male    DOB: [birth year], 66 y.o.   MRN: [id number]  HPI: Philip Huang is a 66 y.o. male presenting on 12/27/2016 for Diabetes (followup; patient is fasting); Hyperlipidemia; and Hypertension   HPI Type 2 diabetes mellitus Patient comes in today for recheck of his diabetes. Patient has been currently taking Glipizide and Janumet and Jardiance, he says his blood sugar in last 2 days that he can remember was 108 and 1 12 in the morning and he is not getting any lows below 98. Patient is currently on an ACE inhibitor. Patient has seen an ophthalmologist this year. Patient denies any issues with his feet.   Hyperlipidemia Patient is coming in for recheck of his hyperlipidemia. He is currently taking Crestor and fenofibrate. He denies any issues with myalgias or history of liver damage from it. He denies any focal numbness or weakness or chest pain.   Hypertension Patient is currently on lisinopril-hydrochlorothiazide, and her blood pressure today is 130/74. Patient denies any lightheadedness or dizziness. Patient denies headaches, blurred vision, chest pains, shortness of breath, or weakness. Denies any side effects from medication and is content with current medication.   Relevant past medical, surgical, family and social history reviewed and updated as indicated. Interim medical history since our last visit reviewed. Allergies and medications reviewed and updated.  Review of Systems  Constitutional: Negative for chills and fever.  Eyes: Negative for visual disturbance.  Respiratory: Negative for cough, shortness of breath and wheezing.   Cardiovascular: Negative for chest pain, palpitations and leg swelling.  Genitourinary: Negative for dysuria and hematuria.  Musculoskeletal: Negative for back pain, gait  problem and myalgias.  Skin: Negative for rash.  Neurological: Negative for dizziness, weakness and headaches.  All other systems reviewed and are negative.   Per HPI unless specifically indicated above        Objective:    BP 130/74   Pulse 64   Temp 97.9 F (36.6 C) (Oral)   Ht 5' 9"  (1.753 m)   Wt 191 lb (86.6 kg)   BMI 28.21 kg/m   Wt Readings from Last 3 Encounters:  12/27/16 191 lb (86.6 kg)  08/03/16 192 lb 8 oz (87.3 kg)  03/23/16 194 lb (88 kg)    Physical Exam  Constitutional: He is oriented to person, place, and time. He appears well-developed and well-nourished. No distress.  Eyes: Conjunctivae are normal. No scleral icterus.  Neck: Neck supple. No thyromegaly present.  Cardiovascular: Normal rate, regular rhythm, normal heart sounds and intact distal pulses.   No murmur heard. Pulmonary/Chest: Effort normal and breath sounds normal. No respiratory distress. He has no wheezes.  Musculoskeletal: Normal range of motion. He exhibits no edema.  Lymphadenopathy:    He has no cervical adenopathy.  Neurological: He is alert and oriented to person, place, and time. Coordination normal.  Skin: Skin is warm and dry. No rash noted. He is not diaphoretic.  Psychiatric: He has a normal mood and affect. His behavior is normal.  Nursing note and vitals reviewed.   Results for orders placed or performed in visit on 08/28/16  HM DIABETES EYE EXAM  Result Value Ref Range   HM Diabetic Eye Exam No Retinopathy No Retinopathy  Assessment & Plan:   Problem List Items Addressed This Visit      Cardiovascular and Mediastinum   Hypertension (Chronic)   Relevant Orders   CMP14+EGFR     Endocrine   Type II diabetes mellitus ([place]) - Primary (Chronic)   Relevant Orders   CMP14+EGFR   Bayer DCA Hb A1c Waived     Other   Hyperlipidemia (Chronic)   Relevant Orders   Lipid panel    Other Visit Diagnoses    Screening for HIV without presence of risk factors        Relevant Orders   HIV antibody       Follow up plan: Return in about 3 months (around 03/29/2017), or if symptoms worsen or fail to improve, for Type 2 diabetes and hypertension recheck.  Counseling provided for all of the vaccine components Orders Placed This Encounter  Procedures  . CMP14+EGFR  . Lipid panel  . Bayer DCA Hb A1c Waived  . HIV antibody    Caryl Pina, MD [facility] 12/27/2016, 8:15 AM

## 2016-12-28 LAB — CMP14+EGFR
ALBUMIN: 4.7 g/dL (ref 3.6–4.8)
ALT: 32 IU/L (ref 0–44)
AST: 26 IU/L (ref 0–40)
Albumin/Globulin Ratio: 1.7 (ref 1.2–2.2)
Alkaline Phosphatase: 41 IU/L (ref 39–117)
BUN/Creatinine Ratio: 17 (ref 10–24)
BUN: 18 mg/dL (ref 8–27)
Bilirubin Total: 0.3 mg/dL (ref 0.0–1.2)
CALCIUM: 9.4 mg/dL (ref 8.6–10.2)
CO2: 25 mmol/L (ref 18–29)
Chloride: 102 mmol/L (ref 96–106)
Creatinine, Ser: 1.05 mg/dL (ref 0.76–1.27)
GFR, EST AFRICAN AMERICAN: 86 mL/min/{1.73_m2} (ref >59)
GFR, EST NON AFRICAN AMERICAN: 75 mL/min/{1.73_m2} (ref >59)
GLUCOSE: 131 mg/dL — AB (ref 65–99)
Globulin, Total: 2.8 g/dL (ref 1.5–4.5)
Potassium: 4 mmol/L (ref 3.5–5.2)
Sodium: 141 mmol/L (ref 134–144)
TOTAL PROTEIN: 7.5 g/dL (ref 6.0–8.5)

## 2016-12-28 LAB — LIPID PANEL
CHOL/HDL RATIO: 5.3 ratio — AB (ref 0.0–5.0)
Cholesterol, Total: 192 mg/dL (ref 100–199)
HDL: 36 mg/dL — ABNORMAL LOW (ref >39)
LDL Calculated: 120 mg/dL — ABNORMAL HIGH (ref 0–99)
Triglycerides: 178 mg/dL — ABNORMAL HIGH (ref 0–149)
VLDL CHOLESTEROL CAL: 36 mg/dL (ref 5–40)

## 2016-12-28 LAB — HIV ANTIBODY (ROUTINE TESTING W REFLEX): HIV SCREEN 4TH GENERATION: NONREACTIVE

## 2017-01-06 ENCOUNTER — Other Ambulatory Visit: Payer: Self-pay | Admitting: Nurse Practitioner

## 2017-01-10 ENCOUNTER — Telehealth: Payer: Self-pay | Admitting: Family Medicine

## 2017-01-10 MED ORDER — TRIAMCINOLONE ACETONIDE 0.1 % EX CREA: 1.0000 "application " | TOPICAL_CREAM | Freq: Two times a day (BID) | CUTANEOUS | 0 refills | Status: DC

## 2017-01-10 NOTE — Telephone Encounter (Signed)
X for triamcinolone cream

## 2017-03-13 ENCOUNTER — Other Ambulatory Visit: Payer: Self-pay | Admitting: Family Medicine

## 2017-03-13 DIAGNOSIS — I159 Secondary hypertension, unspecified: Secondary | ICD-10-CM

## 2017-03-16 ENCOUNTER — Other Ambulatory Visit: Payer: Self-pay | Admitting: Family Medicine

## 2017-03-16 DIAGNOSIS — E7849 Other hyperlipidemia: Secondary | ICD-10-CM

## 2017-03-25 ENCOUNTER — Other Ambulatory Visit: Payer: Self-pay | Admitting: Nurse Practitioner

## 2017-03-25 DIAGNOSIS — E119 Type 2 diabetes mellitus without complications: Secondary | ICD-10-CM

## 2017-03-28 ENCOUNTER — Other Ambulatory Visit: Payer: Self-pay | Admitting: Family Medicine

## 2017-03-29 NOTE — Telephone Encounter (Signed)
Next OV 04/17/17

## 2017-04-13 ENCOUNTER — Other Ambulatory Visit: Payer: Self-pay | Admitting: Family Medicine

## 2017-04-13 DIAGNOSIS — I159 Secondary hypertension, unspecified: Secondary | ICD-10-CM

## 2017-04-17 ENCOUNTER — Ambulatory Visit: Payer: BLUE CROSS/BLUE SHIELD | Admitting: Nurse Practitioner

## 2017-04-17 ENCOUNTER — Ambulatory Visit (INDEPENDENT_AMBULATORY_CARE_PROVIDER_SITE_OTHER): Payer: BLUE CROSS/BLUE SHIELD | Admitting: Family Medicine

## 2017-04-17 ENCOUNTER — Encounter: Payer: Self-pay | Admitting: Family Medicine

## 2017-04-17 VITALS — BP 134/78 | HR 56 | Temp 96.9°F | Ht 69.0 in | Wt 197.0 lb

## 2017-04-17 DIAGNOSIS — E1159 Type 2 diabetes mellitus with other circulatory complications: Secondary | ICD-10-CM | POA: Diagnosis not present

## 2017-04-17 DIAGNOSIS — E119 Type 2 diabetes mellitus without complications: Secondary | ICD-10-CM

## 2017-04-17 DIAGNOSIS — E1169 Type 2 diabetes mellitus with other specified complication: Secondary | ICD-10-CM

## 2017-04-17 DIAGNOSIS — I10 Essential (primary) hypertension: Secondary | ICD-10-CM | POA: Diagnosis not present

## 2017-04-17 DIAGNOSIS — E785 Hyperlipidemia, unspecified: Secondary | ICD-10-CM | POA: Diagnosis not present

## 2017-04-17 LAB — BAYER DCA HB A1C WAIVED: HB A1C: 7.2 % — AB (ref <7.0)

## 2017-04-17 NOTE — Progress Notes (Signed)
BP 134/78   Pulse (!) 56   Temp (!) 96.9 F (36.1 C) (Oral)   Ht 5' 9"  (1.753 m)   Wt 197 lb (89.4 kg)   BMI 29.09 kg/m    Subjective:    Patient ID: Philip Huang, male    DOB: 07-14-1951, 66 y.o.   MRN: [id number]  HPI: Philip Huang is a 66 y.o. male presenting on 04/17/2017 for Hyperlipidemia (3 month follow up); Hypertension; and Diabetes   HPI Type 2 diabetes mellitus Patient comes in today for recheck of his diabetes. Patient has been currently taking Jardiance and Janumet and glipizide. Patient is currently on an ACE inhibitor/ARB. Patient has not seen an ophthalmologist this year. Patient denies any issues with their feet.   Hypertension Patient is currently on lisinopril-hydrochlorothiazide, and their blood pressure today is 134/78. Patient denies any lightheadedness or dizziness. Patient denies headaches, blurred vision, chest pains, shortness of breath, or weakness. Denies any side effects from medication and is content with current medication.   Hyperlipidemia Patient is coming in for recheck of his hyperlipidemia. The patient is currently taking Crestor. They deny any issues with myalgias or history of liver damage from it. They deny any focal numbness or weakness or chest pain.   Relevant past medical, surgical, family and social history reviewed and updated as indicated. Interim medical history since our last visit reviewed. Allergies and medications reviewed and updated.  Review of Systems  Constitutional: Negative for chills and fever.  HENT: Negative for ear pain and tinnitus.   Eyes: Negative for pain.  Respiratory: Negative for cough, shortness of breath and wheezing.   Cardiovascular: Negative for chest pain, palpitations and leg swelling.  Gastrointestinal: Negative for abdominal pain, blood in stool, constipation and diarrhea.  Genitourinary: Negative for dysuria and hematuria.  Musculoskeletal: Negative for back pain, gait problem and  myalgias.  Skin: Negative for rash.  Neurological: Negative for dizziness, weakness and headaches.  Psychiatric/Behavioral: Negative for suicidal ideas.  All other systems reviewed and are negative.   Per HPI unless specifically indicated above     Objective:    BP 134/78   Pulse (!) 56   Temp (!) 96.9 F (36.1 C) (Oral)   Ht 5' 9"  (1.753 m)   Wt 197 lb (89.4 kg)   BMI 29.09 kg/m   Wt Readings from Last 3 Encounters:  04/17/17 197 lb (89.4 kg)  12/27/16 191 lb (86.6 kg)  08/03/16 192 lb 8 oz (87.3 kg)    Physical Exam  Constitutional: He is oriented to person, place, and time. He appears well-developed and well-nourished. No distress.  Eyes: Conjunctivae are normal. No scleral icterus.  Neck: Neck supple. No thyromegaly present.  Cardiovascular: Normal rate, regular rhythm, normal heart sounds and intact distal pulses.   No murmur heard. Pulmonary/Chest: Effort normal and breath sounds normal. No respiratory distress. He has no wheezes.  Musculoskeletal: Normal range of motion. He exhibits no edema.  Lymphadenopathy:    He has no cervical adenopathy.  Neurological: He is alert and oriented to person, place, and time. Coordination normal.  Skin: Skin is warm and dry. No rash noted. He is not diaphoretic.  Psychiatric: He has a normal mood and affect. His behavior is normal.  Nursing note and vitals reviewed.   Results for orders placed or performed in visit on 12/27/16  CMP14+EGFR  Result Value Ref Range   Glucose 131 (H) 65 - 99 mg/dL   BUN 18 8 - 27 mg/dL  Creatinine, Ser 1.05 0.76 - 1.27 mg/dL   GFR calc non Af Amer 75 >59 mL/min/1.73   GFR calc Af Amer 86 >59 mL/min/1.73   BUN/Creatinine Ratio 17 10 - 24   Sodium 141 134 - 144 mmol/L   Potassium 4.0 3.5 - 5.2 mmol/L   Chloride 102 96 - 106 mmol/L   CO2 25 18 - 29 mmol/L   Calcium 9.4 8.6 - 10.2 mg/dL   Total Protein 7.5 6.0 - 8.5 g/dL   Albumin 4.7 3.6 - 4.8 g/dL   Globulin, Total 2.8 1.5 - 4.5 g/dL    Albumin/Globulin Ratio 1.7 1.2 - 2.2   Bilirubin Total 0.3 0.0 - 1.2 mg/dL   Alkaline Phosphatase 41 39 - 117 IU/L   AST 26 0 - 40 IU/L   ALT 32 0 - 44 IU/L  Lipid panel  Result Value Ref Range   Cholesterol, Total 192 100 - 199 mg/dL   Triglycerides 178 (H) 0 - 149 mg/dL   HDL 36 (L) >39 mg/dL   VLDL Cholesterol Cal 36 5 - 40 mg/dL   LDL Calculated 120 (H) 0 - 99 mg/dL   Chol/HDL Ratio 5.3 (H) 0.0 - 5.0 ratio  Bayer DCA Hb A1c Waived  Result Value Ref Range   Bayer DCA Hb A1c Waived 6.8 <7.0 %  HIV antibody  Result Value Ref Range   HIV Screen 4th Generation wRfx Non Reactive Non Reactive      Assessment & Plan:   Problem List Items Addressed This Visit      Cardiovascular and Mediastinum   Hypertension associated with diabetes ([place])     Endocrine   Type II diabetes mellitus ([place]) - Primary (Chronic)   Relevant Orders   Bayer DCA Hb A1c Waived   Hyperlipidemia associated with type 2 diabetes mellitus ([place])       Follow up plan: Return in about 3 months (around 07/17/2017), or if symptoms worsen or fail to improve, for Recheck diabetes.  Counseling provided for all of the vaccine components Orders Placed This Encounter  Procedures  . Bayer [facility] Hb A1c Veblen, MD [facility] 04/17/2017, 8:02 AM

## 2017-05-06 ENCOUNTER — Telehealth: Payer: Self-pay | Admitting: Family Medicine

## 2017-05-07 ENCOUNTER — Telehealth: Payer: Self-pay

## 2017-05-07 NOTE — Telephone Encounter (Signed)
Patient asked for a tier exception on Jardiance and Janumet making these drugs cheaper for him.  It was denied  We can do an appeal if you can explain why patient can not take lower cost drugs offered on [employer] list for his condition

## 2017-05-07 NOTE — Telephone Encounter (Signed)
Spoke with wife, their insurance company will be sending paperwork for [place] to fill out for a "Tier Level Exception" for his Jardiance and Janumet medications.  Just wanted to make sure we know and will complete this paperwork asap.  I spoke with Debbi and she has received the [place] paperwork, still waiting on Janumet.  Informed patient's wife of this.

## 2017-05-07 NOTE — Telephone Encounter (Signed)
Jardiance paperwork received and sent back per Debbi.  Have not received paperwork from [place] yet.  Informed patient's wife and she will contact insurance company and ask them to resend to [place].

## 2017-05-08 NOTE — Telephone Encounter (Signed)
To be able to fully justify that I would need to know what are the lower cost drugs offered on his [employer] list Caryl Pina, MD [facility] 05/08/2017, 9:10 AM

## 2017-05-10 ENCOUNTER — Telehealth: Payer: Self-pay | Admitting: Family Medicine

## 2017-05-16 ENCOUNTER — Other Ambulatory Visit: Payer: Self-pay | Admitting: Family Medicine

## 2017-06-07 ENCOUNTER — Telehealth: Payer: Self-pay | Admitting: Family Medicine

## 2017-06-07 DIAGNOSIS — I159 Secondary hypertension, unspecified: Secondary | ICD-10-CM

## 2017-06-07 DIAGNOSIS — E785 Hyperlipidemia, unspecified: Secondary | ICD-10-CM

## 2017-06-07 DIAGNOSIS — E7849 Other hyperlipidemia: Secondary | ICD-10-CM

## 2017-06-07 NOTE — Telephone Encounter (Signed)
What is the name of the medication? fenofibrate 160 MG tablet glipiZIDE (GLUCOTROL XL) 10 MG 24 hr tablet lisinopril-hydrochlorothiazide (PRINZIDE,ZESTORETIC) 20-12.5 MG tablet rosuvastatin (CRESTOR) 40 MG tablet  Have you contacted your pharmacy to request a refill? No refills  Which pharmacy would you like this sent to? [facility] in [place]  Pt needs samples for JARDIANCE 10 MG TABS tablet because pt cannot afford this medication  Patient notified that their request is being sent to the clinical staff for review and that they should receive a call once it is complete. If they do not receive a call within 24 hours they can check with their pharmacy or our office.

## 2017-06-08 MED ORDER — GLIPIZIDE ER 10 MG PO TB24: 10.0000 mg | ORAL_TABLET | Freq: Every day | ORAL | 1 refills | Status: DC

## 2017-06-08 MED ORDER — LISINOPRIL-HYDROCHLOROTHIAZIDE 20-12.5 MG PO TABS: 2.0000 | ORAL_TABLET | Freq: Every day | ORAL | 1 refills | Status: DC

## 2017-06-08 MED ORDER — ROSUVASTATIN CALCIUM 40 MG PO TABS: 40.0000 mg | ORAL_TABLET | Freq: Every day | ORAL | 1 refills | Status: DC

## 2017-06-08 MED ORDER — FENOFIBRATE 160 MG PO TABS: 160.0000 mg | ORAL_TABLET | Freq: Every day | ORAL | 1 refills | Status: DC

## 2017-06-08 NOTE — Telephone Encounter (Signed)
Pt aware rx's sent to pharmacy and they also requested samples of Jardiance which I placed up front for pt to pick up.

## 2017-06-12 ENCOUNTER — Other Ambulatory Visit: Payer: Self-pay | Admitting: Family Medicine

## 2017-06-12 DIAGNOSIS — E7849 Other hyperlipidemia: Secondary | ICD-10-CM

## 2017-07-08 ENCOUNTER — Telehealth: Payer: Self-pay | Admitting: Family Medicine

## 2017-07-09 NOTE — Telephone Encounter (Signed)
Samples up front, patient aware

## 2017-07-24 ENCOUNTER — Other Ambulatory Visit: Payer: Self-pay | Admitting: Family Medicine

## 2017-07-24 DIAGNOSIS — E785 Hyperlipidemia, unspecified: Secondary | ICD-10-CM

## 2017-08-09 ENCOUNTER — Telehealth: Payer: Self-pay | Admitting: Family Medicine

## 2017-08-09 ENCOUNTER — Other Ambulatory Visit: Payer: Self-pay

## 2017-08-09 MED ORDER — BLOOD GLUCOSE MONITOR KIT: PACK | 0 refills | Status: DC

## 2017-08-09 NOTE — Telephone Encounter (Signed)
mandy took phone call

## 2017-08-19 ENCOUNTER — Other Ambulatory Visit: Payer: Self-pay | Admitting: Family Medicine

## 2017-08-19 DIAGNOSIS — E7849 Other hyperlipidemia: Secondary | ICD-10-CM

## 2017-08-20 NOTE — Telephone Encounter (Signed)
Last lipid 12/27/16  Dr D

## 2017-08-26 ENCOUNTER — Ambulatory Visit (INDEPENDENT_AMBULATORY_CARE_PROVIDER_SITE_OTHER): Payer: Medicare Other | Admitting: Family Medicine

## 2017-08-26 ENCOUNTER — Encounter: Payer: Self-pay | Admitting: Family Medicine

## 2017-08-26 VITALS — BP 134/76 | HR 66 | Temp 97.9°F | Ht 69.0 in | Wt 196.0 lb

## 2017-08-26 DIAGNOSIS — X32XXXD Exposure to sunlight, subsequent encounter: Secondary | ICD-10-CM | POA: Diagnosis not present

## 2017-08-26 DIAGNOSIS — E1169 Type 2 diabetes mellitus with other specified complication: Secondary | ICD-10-CM | POA: Diagnosis not present

## 2017-08-26 DIAGNOSIS — I152 Hypertension secondary to endocrine disorders: Secondary | ICD-10-CM

## 2017-08-26 DIAGNOSIS — E119 Type 2 diabetes mellitus without complications: Secondary | ICD-10-CM | POA: Diagnosis not present

## 2017-08-26 DIAGNOSIS — I10 Essential (primary) hypertension: Secondary | ICD-10-CM

## 2017-08-26 DIAGNOSIS — L570 Actinic keratosis: Secondary | ICD-10-CM | POA: Diagnosis not present

## 2017-08-26 DIAGNOSIS — Z85828 Personal history of other malignant neoplasm of skin: Secondary | ICD-10-CM | POA: Diagnosis not present

## 2017-08-26 DIAGNOSIS — B355 Tinea imbricata: Secondary | ICD-10-CM | POA: Diagnosis not present

## 2017-08-26 DIAGNOSIS — E1159 Type 2 diabetes mellitus with other circulatory complications: Secondary | ICD-10-CM | POA: Diagnosis not present

## 2017-08-26 DIAGNOSIS — D225 Melanocytic nevi of trunk: Secondary | ICD-10-CM | POA: Diagnosis not present

## 2017-08-26 DIAGNOSIS — Z9189 Other specified personal risk factors, not elsewhere classified: Secondary | ICD-10-CM

## 2017-08-26 DIAGNOSIS — Z6829 Body mass index (BMI) 29.0-29.9, adult: Secondary | ICD-10-CM

## 2017-08-26 DIAGNOSIS — Z08 Encounter for follow-up examination after completed treatment for malignant neoplasm: Secondary | ICD-10-CM | POA: Diagnosis not present

## 2017-08-26 DIAGNOSIS — E785 Hyperlipidemia, unspecified: Secondary | ICD-10-CM | POA: Diagnosis not present

## 2017-08-26 DIAGNOSIS — Z23 Encounter for immunization: Secondary | ICD-10-CM

## 2017-08-26 DIAGNOSIS — B351 Tinea unguium: Secondary | ICD-10-CM | POA: Diagnosis not present

## 2017-08-26 LAB — BAYER DCA HB A1C WAIVED: HB A1C (BAYER DCA - WAIVED): 6.9 % (ref <7.0)

## 2017-08-26 MED ORDER — ROSUVASTATIN CALCIUM 40 MG PO TABS: 40.0000 mg | ORAL_TABLET | Freq: Every day | ORAL | 1 refills | Status: DC

## 2017-08-26 MED ORDER — LISINOPRIL-HYDROCHLOROTHIAZIDE 20-12.5 MG PO TABS: 2.0000 | ORAL_TABLET | Freq: Every day | ORAL | 1 refills | Status: DC

## 2017-08-26 MED ORDER — FENOFIBRATE 160 MG PO TABS: 160.0000 mg | ORAL_TABLET | Freq: Every day | ORAL | 1 refills | Status: DC

## 2017-08-26 MED ORDER — EMPAGLIFLOZIN 10 MG PO TABS: 10.0000 mg | ORAL_TABLET | Freq: Every day | ORAL | 1 refills | Status: DC

## 2017-08-26 MED ORDER — GLIPIZIDE ER 10 MG PO TB24: 10.0000 mg | ORAL_TABLET | Freq: Every day | ORAL | 1 refills | Status: DC

## 2017-08-26 NOTE — Progress Notes (Signed)
BP 134/76   Pulse 66   Temp 97.9 F (36.6 C) (Oral)   Ht 5' 9"  (1.753 m)   Wt 196 lb (88.9 kg)   BMI 28.94 kg/m    Subjective:    Patient ID: Philip Huang, male    DOB: Aug 23, 1950, 67 y.o.   MRN: [id number]  HPI: Philip Huang is a 67 y.o. male presenting on 08/26/2017 for Diabetes; Hyperlipidemia; and Hypertension   HPI Type 2 diabetes mellitus Patient comes in today for recheck of his diabetes. Patient has been currently taking Janumet and Jardiance and glipizide. Patient is currently on an ACE inhibitor/ARB. Patient has not seen an ophthalmologist this year. Patient denies any issues with their feet.   Hypertension Patient is currently on lisinopril_HCTZ, and their blood pressure today is 134/76. Patient denies any lightheadedness or dizziness. Patient denies headaches, blurred vision, chest pains, shortness of breath, or weakness. Denies any side effects from medication and is content with current medication.   Hyperlipidemia Patient is coming in for recheck of his hyperlipidemia. The patient is currently taking Crestor and fenofibrate. They deny any issues with myalgias or history of liver damage from it. They deny any focal numbness or weakness or chest pain.   Relevant past medical, surgical, family and social history reviewed and updated as indicated. Interim medical history since our last visit reviewed. Allergies and medications reviewed and updated.  Review of Systems  Constitutional: Negative for chills and fever.  Respiratory: Negative for shortness of breath and wheezing.   Cardiovascular: Negative for chest pain and leg swelling.  Musculoskeletal: Negative for back pain and gait problem.  Skin: Negative for rash.  Neurological: Negative for dizziness, weakness, light-headedness and numbness.  All other systems reviewed and are negative.   Per HPI unless specifically indicated above   Allergies as of 08/26/2017      Reactions   Doxycycline    Sulfa Antibiotics Rash      Medication List        Accurate as of 08/26/17  8:12 AM. Always use your most recent med list.          aspirin 81 MG tablet Take 325 mg by mouth daily.   blood glucose meter kit and supplies Kit Accucheck Meter. Check BS BID and prn Dx Code E11.9   CONTOUR NEXT TEST test strip Generic drug:  glucose blood TEST ONCE A DAY   fenofibrate 160 MG tablet TAKE 1 TABLET BY MOUTH EVERY DAY   fenofibrate 160 MG tablet TAKE 1 TABLET BY MOUTH ONCE DAILY   fish oil-omega-3 fatty acids 1000 MG capsule Take 1 g by mouth daily.   glipiZIDE 10 MG 24 hr tablet Commonly known as:  GLUCOTROL XL Take 1 tablet (10 mg total) by mouth daily with breakfast.   JANUMET 50-1000 MG tablet Generic drug:  sitaGLIPtin-metformin TAKE 1 TABLET BY MOUTH 2 (TWO) TIMES DAILY WITH A MEAL.   JARDIANCE 10 MG Tabs tablet Generic drug:  empagliflozin TAKE 1 TABLET EVERY DAY   LIFESCAN UNISTIK 2 Misc 1 strip by Does not apply route daily. Test 1X per day-- dx 250.02   lisinopril-hydrochlorothiazide 20-12.5 MG tablet Commonly known as:  PRINZIDE,ZESTORETIC Take 2 tablets by mouth daily.   MENS 50+ ADVANCED PO Take by mouth.   nitroGLYCERIN 0.4 MG SL tablet Commonly known as:  NITROSTAT Place 1 tablet (0.4 mg total) under the tongue every 5 (five) minutes as needed.   rosuvastatin 40 MG tablet Commonly known as:  CRESTOR TAKE 1 TABLET BY MOUTH EVERY DAY   Salicylic Acid 50 % Soln by Does not apply route.   triamcinolone cream 0.1 % Commonly known as:  KENALOG Apply 1 application topically 2 (two) times daily.   vitamin C 500 MG tablet Commonly known as:  ASCORBIC ACID Take 500 mg by mouth daily.   Vitamin D 2000 units tablet Take 2,000 Units by mouth daily.         Objective:    BP 134/76   Pulse 66   Temp 97.9 F (36.6 C) (Oral)   Ht 5' 9"  (1.753 m)   Wt 196 lb (88.9 kg)   BMI 28.94 kg/m   Wt Readings from Last 3 Encounters:    08/26/17 196 lb (88.9 kg)  04/17/17 197 lb (89.4 kg)  12/27/16 191 lb (86.6 kg)    Physical Exam  Constitutional: He is oriented to person, place, and time. He appears well-developed and well-nourished. No distress.  Eyes: Conjunctivae are normal. No scleral icterus.  Neck: Neck supple. No thyromegaly present.  Cardiovascular: Normal rate, regular rhythm, normal heart sounds and intact distal pulses.  No murmur heard. Pulmonary/Chest: Effort normal and breath sounds normal. No respiratory distress. He has no wheezes. He has no rales.  Musculoskeletal: Normal range of motion. He exhibits no edema.  Lymphadenopathy:    He has no cervical adenopathy.  Neurological: He is alert and oriented to person, place, and time. Coordination normal.  Skin: Skin is warm and dry. No rash noted. He is not diaphoretic.  Psychiatric: He has a normal mood and affect. His behavior is normal.  Nursing note and vitals reviewed.       Assessment & Plan:   Problem List Items Addressed This Visit      Cardiovascular and Mediastinum   Hypertension associated with diabetes ([place])   Relevant Medications   rosuvastatin (CRESTOR) 40 MG tablet   lisinopril-hydrochlorothiazide (PRINZIDE,ZESTORETIC) 20-12.5 MG tablet   empagliflozin (JARDIANCE) 10 MG TABS tablet   glipiZIDE (GLUCOTROL XL) 10 MG 24 hr tablet   fenofibrate 160 MG tablet   Other Relevant Orders   CMP14+EGFR     Endocrine   Type II diabetes mellitus ([facility]) - Primary (Chronic)   Relevant Medications   rosuvastatin (CRESTOR) 40 MG tablet   lisinopril-hydrochlorothiazide (PRINZIDE,ZESTORETIC) 20-12.5 MG tablet   empagliflozin (JARDIANCE) 10 MG TABS tablet   glipiZIDE (GLUCOTROL XL) 10 MG 24 hr tablet   Other Relevant Orders   Bayer DCA Hb A1c Waived   CMP14+EGFR   Hyperlipidemia associated with type 2 diabetes mellitus ([facility])   Relevant Medications   rosuvastatin (CRESTOR) 40 MG tablet   lisinopril-hydrochlorothiazide (PRINZIDE,ZESTORETIC)  20-12.5 MG tablet   empagliflozin (JARDIANCE) 10 MG TABS tablet   glipiZIDE (GLUCOTROL XL) 10 MG 24 hr tablet   fenofibrate 160 MG tablet   Other Relevant Orders   Lipid panel     Other   BMI 29.0-29.9,adult    Other Visit Diagnoses    High risk of cardiac event       Relevant Orders   ECHOCARDIOGRAM STRESS TEST       Follow up plan: Return in about 3 months (around 11/24/2017), or if symptoms worsen or fail to improve, for Type 2 diabetes.  Counseling provided for all of the vaccine components Orders Placed This Encounter  Procedures  . Bayer DCA Hb A1c Waived  . CMP14+EGFR  . Lipid panel    Caryl Pina, MD [facility] 08/26/2017, 8:12 AM

## 2017-08-26 NOTE — Addendum Note (Signed)
Addended by: Michaela Corner on: 08/26/2017 08:59 AM   Modules accepted: Orders

## 2017-08-27 LAB — CMP14+EGFR
ALK PHOS: 41 IU/L (ref 39–117)
ALT: 28 IU/L (ref 0–44)
AST: 23 IU/L (ref 0–40)
Albumin/Globulin Ratio: 1.6 (ref 1.2–2.2)
Albumin: 4.7 g/dL (ref 3.6–4.8)
BUN/Creatinine Ratio: 19 (ref 10–24)
BUN: 24 mg/dL (ref 8–27)
Bilirubin Total: 0.2 mg/dL (ref 0.0–1.2)
CO2: 24 mmol/L (ref 20–29)
CREATININE: 1.24 mg/dL (ref 0.76–1.27)
Calcium: 9.8 mg/dL (ref 8.6–10.2)
Chloride: 103 mmol/L (ref 96–106)
GFR calc Af Amer: 70 mL/min/{1.73_m2} (ref >59)
GFR calc non Af Amer: 61 mL/min/{1.73_m2} (ref >59)
GLUCOSE: 129 mg/dL — AB (ref 65–99)
Globulin, Total: 2.9 g/dL (ref 1.5–4.5)
Potassium: 4.7 mmol/L (ref 3.5–5.2)
Sodium: 143 mmol/L (ref 134–144)
Total Protein: 7.6 g/dL (ref 6.0–8.5)

## 2017-08-27 LAB — LIPID PANEL
CHOLESTEROL TOTAL: 193 mg/dL (ref 100–199)
Chol/HDL Ratio: 5.5 ratio — ABNORMAL HIGH (ref 0.0–5.0)
HDL: 35 mg/dL — ABNORMAL LOW (ref >39)
LDL Calculated: 105 mg/dL — ABNORMAL HIGH (ref 0–99)
TRIGLYCERIDES: 267 mg/dL — AB (ref 0–149)
VLDL CHOLESTEROL CAL: 53 mg/dL — AB (ref 5–40)

## 2017-08-29 ENCOUNTER — Telehealth: Payer: Self-pay | Admitting: Family Medicine

## 2017-08-29 ENCOUNTER — Other Ambulatory Visit (HOSPITAL_COMMUNITY): Payer: Managed Care, Other (non HMO)

## 2017-08-29 MED ORDER — DAPAGLIFLOZIN PROPANEDIOL 5 MG PO TABS: 5.0000 mg | ORAL_TABLET | Freq: Every day | ORAL | 3 refills | Status: DC

## 2017-08-29 NOTE — Telephone Encounter (Signed)
Wife aware per dpr.  

## 2017-08-29 NOTE — Telephone Encounter (Signed)
-----   Message from Michaela Corner, LPN sent at [phone]  4:53 PM EST ----- Juluis Rainier, patient's wife went to pick up Jardiance and it was going to be $622.00.  She is aware of patient's labs.

## 2017-09-05 ENCOUNTER — Telehealth: Payer: Self-pay

## 2017-09-05 ENCOUNTER — Other Ambulatory Visit (HOSPITAL_COMMUNITY): Payer: Managed Care, Other (non HMO)

## 2017-09-05 ENCOUNTER — Telehealth: Payer: Self-pay | Admitting: Family Medicine

## 2017-09-05 DIAGNOSIS — I10 Essential (primary) hypertension: Secondary | ICD-10-CM

## 2017-09-05 DIAGNOSIS — E118 Type 2 diabetes mellitus with unspecified complications: Secondary | ICD-10-CM

## 2017-09-05 DIAGNOSIS — E7800 Pure hypercholesterolemia, unspecified: Secondary | ICD-10-CM

## 2017-09-05 NOTE — Telephone Encounter (Signed)
Philip Huang was going to cost $420 at [facility], so patient did not buy.  Is continuing diet and trying to watch diet closer.

## 2017-09-05 NOTE — Telephone Encounter (Signed)
Pt wife aware that we did not cancel this test - it looks as if cardio person cancelled at [facility] request???   She will contact cardio office for explanation. Wife is upset that she took   Spoke with Dr Building control surveyor - he got a staff note last week from cardio and was aware that they were cancelling the appt and he was under the assumption that they contacted the pt - since they cancelled it. He wanted a referral to cardio = this was placed today.

## 2017-09-06 ENCOUNTER — Telehealth (HOSPITAL_COMMUNITY): Payer: Self-pay | Admitting: Family Medicine

## 2017-09-06 NOTE — Telephone Encounter (Signed)
09/04/17 Cancel Rsn: Provider (Medical Director (Dr. Meda Coffee) recommended a more appropriate test-Dr. [name] notified )

## 2017-10-09 LAB — HM DIABETES EYE EXAM

## 2017-11-05 ENCOUNTER — Telehealth: Payer: Self-pay | Admitting: Family Medicine

## 2017-11-05 NOTE — Telephone Encounter (Signed)
Patient didn't know the name of the medication

## 2017-11-05 NOTE — Telephone Encounter (Signed)
Called [facility]:  jardiance 10 mg given

## 2017-11-10 ENCOUNTER — Other Ambulatory Visit: Payer: Self-pay | Admitting: Family Medicine

## 2017-11-25 ENCOUNTER — Ambulatory Visit (INDEPENDENT_AMBULATORY_CARE_PROVIDER_SITE_OTHER): Payer: Medicare Other | Admitting: Family Medicine

## 2017-11-25 ENCOUNTER — Encounter: Payer: Self-pay | Admitting: Family Medicine

## 2017-11-25 VITALS — BP 140/68 | HR 57 | Temp 98.3°F | Ht 69.0 in | Wt 194.0 lb

## 2017-11-25 DIAGNOSIS — E785 Hyperlipidemia, unspecified: Secondary | ICD-10-CM

## 2017-11-25 DIAGNOSIS — B351 Tinea unguium: Secondary | ICD-10-CM | POA: Diagnosis not present

## 2017-11-25 DIAGNOSIS — E1159 Type 2 diabetes mellitus with other circulatory complications: Secondary | ICD-10-CM

## 2017-11-25 DIAGNOSIS — Z6828 Body mass index (BMI) 28.0-28.9, adult: Secondary | ICD-10-CM

## 2017-11-25 DIAGNOSIS — R350 Frequency of micturition: Secondary | ICD-10-CM | POA: Diagnosis not present

## 2017-11-25 DIAGNOSIS — I10 Essential (primary) hypertension: Secondary | ICD-10-CM | POA: Diagnosis not present

## 2017-11-25 DIAGNOSIS — E1169 Type 2 diabetes mellitus with other specified complication: Secondary | ICD-10-CM | POA: Diagnosis not present

## 2017-11-25 DIAGNOSIS — B353 Tinea pedis: Secondary | ICD-10-CM | POA: Diagnosis not present

## 2017-11-25 LAB — BAYER DCA HB A1C WAIVED: HB A1C (BAYER DCA - WAIVED): 7.1 % — ABNORMAL HIGH (ref <7.0)

## 2017-11-25 LAB — BMP8+EGFR
BUN / CREAT RATIO: 18 (ref 10–24)
BUN: 20 mg/dL (ref 8–27)
CO2: 23 mmol/L (ref 20–29)
Calcium: 9.2 mg/dL (ref 8.6–10.2)
Chloride: 101 mmol/L (ref 96–106)
Creatinine, Ser: 1.1 mg/dL (ref 0.76–1.27)
GFR calc non Af Amer: 70 mL/min/{1.73_m2} (ref >59)
GFR, EST AFRICAN AMERICAN: 81 mL/min/{1.73_m2} (ref >59)
Glucose: 113 mg/dL — ABNORMAL HIGH (ref 65–99)
POTASSIUM: 3.8 mmol/L (ref 3.5–5.2)
Sodium: 140 mmol/L (ref 134–144)

## 2017-11-25 MED ORDER — ROSUVASTATIN CALCIUM 40 MG PO TABS: 40.0000 mg | ORAL_TABLET | Freq: Every day | ORAL | 1 refills | Status: DC

## 2017-11-25 MED ORDER — GLIPIZIDE ER 10 MG PO TB24: 10.0000 mg | ORAL_TABLET | Freq: Every day | ORAL | 1 refills | Status: DC

## 2017-11-25 MED ORDER — FENOFIBRATE 160 MG PO TABS: 160.0000 mg | ORAL_TABLET | Freq: Every day | ORAL | 1 refills | Status: DC

## 2017-11-25 MED ORDER — TAMSULOSIN HCL 0.4 MG PO CAPS: 0.4000 mg | ORAL_CAPSULE | Freq: Every day | ORAL | 3 refills | Status: DC

## 2017-11-25 MED ORDER — LISINOPRIL-HYDROCHLOROTHIAZIDE 20-12.5 MG PO TABS: 2.0000 | ORAL_TABLET | Freq: Every day | ORAL | 1 refills | Status: DC

## 2017-11-25 NOTE — Progress Notes (Signed)
BP 140/68   Pulse (!) 57   Temp 98.3 F (36.8 C) (Oral)   Ht 5' 9"  (1.753 m)   Wt 194 lb (88 kg)   BMI 28.65 kg/m    Subjective:    Patient ID: Philip Huang, male    DOB: 03/17/1951, 67 y.o.   MRN: [id number]  HPI: Philip Huang is a 67 y.o. male presenting on 11/25/2017 for Diabetes (3 mo); Hyperlipidemia; and Hypertension   HPI Type 2 diabetes mellitus Patient comes in today for recheck of his diabetes. Patient has been currently taking Jardiance and glipizide and Janumet and says his blood sugars run between 85 and 136 in the mornings. Patient is currently on an ACE inhibitor/ARB. Patient has seen an ophthalmologist this year. Patient thickened yellow toenails and dry flaky skin.   Hyperlipidemia Patient is coming in for recheck of his hyperlipidemia. The patient is currently taking Crestor and omega-3's and fenofibrate. They deny any issues with myalgias or history of liver damage from it. They deny any focal numbness or weakness or chest pain.   Hypertension Patient is currently on lisinopril-hydrochlorothiazide, and their blood pressure today is 140/68. Patient denies any lightheadedness or dizziness. Patient denies headaches, blurred vision, chest pains, shortness of breath, or weakness. Denies any side effects from medication and is content with current medication.   Relevant past medical, surgical, family and social history reviewed and updated as indicated. Interim medical history since our last visit reviewed. Allergies and medications reviewed and updated.  Review of Systems  Constitutional: Negative for chills and fever.  Eyes: Negative for discharge.  Respiratory: Negative for shortness of breath and wheezing.   Cardiovascular: Negative for chest pain and leg swelling.  Musculoskeletal: Negative for back pain and gait problem.  Skin: Negative for rash.  Neurological: Negative for dizziness, weakness, light-headedness and numbness.  All other  systems reviewed and are negative.   Per HPI unless specifically indicated above   Allergies as of 11/25/2017      Reactions   Doxycycline    Sulfa Antibiotics Rash      Medication List        Accurate as of 11/25/17  8:50 AM. Always use your most recent med list.          ACCU-CHEK AVIVA PLUS test strip Generic drug:  glucose blood USE TO CHECK BLOOD SUGAR TWICE DAILY AND AS NEEDED   aspirin 81 MG tablet Take 325 mg by mouth daily.   blood glucose meter kit and supplies Kit Accucheck Meter. Check BS BID and prn Dx Code E11.9   fenofibrate 160 MG tablet Take 1 tablet (160 mg total) by mouth daily.   fish oil-omega-3 fatty acids 1000 MG capsule Take 1 g by mouth daily.   glipiZIDE 10 MG 24 hr tablet Commonly known as:  GLUCOTROL XL Take 1 tablet (10 mg total) by mouth daily with breakfast.   JANUMET 50-1000 MG tablet Generic drug:  sitaGLIPtin-metformin TAKE 1 TABLET BY MOUTH 2 (TWO) TIMES DAILY WITH A MEAL.   JARDIANCE 10 MG Tabs tablet Generic drug:  empagliflozin Take 10 mg by mouth daily. Samples   LIFESCAN UNISTIK 2 Misc 1 strip by Does not apply route daily. Test 1X per day-- dx 250.02   lisinopril-hydrochlorothiazide 20-12.5 MG tablet Commonly known as:  PRINZIDE,ZESTORETIC Take 2 tablets by mouth daily.   MENS 50+ ADVANCED PO Take by mouth.   nitroGLYCERIN 0.4 MG SL tablet Commonly known as:  NITROSTAT Place 1 tablet (0.4  mg total) under the tongue every 5 (five) minutes as needed.   rosuvastatin 40 MG tablet Commonly known as:  CRESTOR Take 1 tablet (40 mg total) by mouth daily.   Salicylic Acid 50 % Soln by Does not apply route.   triamcinolone cream 0.1 % Commonly known as:  KENALOG Apply 1 application topically 2 (two) times daily.   vitamin C 500 MG tablet Commonly known as:  ASCORBIC ACID Take 500 mg by mouth daily.   Vitamin D 2000 units tablet Take 2,000 Units by mouth daily.          Objective:    BP 140/68   Pulse  (!) 57   Temp 98.3 F (36.8 C) (Oral)   Ht 5' 9"  (1.753 m)   Wt 194 lb (88 kg)   BMI 28.65 kg/m   Wt Readings from Last 3 Encounters:  11/25/17 194 lb (88 kg)  08/26/17 196 lb (88.9 kg)  04/17/17 197 lb (89.4 kg)    Physical Exam  Constitutional: He is oriented to person, place, and time. He appears well-developed and well-nourished. No distress.  Eyes: Conjunctivae are normal. No scleral icterus.  Neck: Neck supple. No thyromegaly present.  Cardiovascular: Normal rate, regular rhythm, normal heart sounds and intact distal pulses.  No murmur heard. Pulmonary/Chest: Effort normal and breath sounds normal. No respiratory distress. He has no wheezes.  Musculoskeletal: Normal range of motion. He exhibits no edema.  Lymphadenopathy:    He has no cervical adenopathy.  Neurological: He is alert and oriented to person, place, and time. Coordination normal.  Skin: Skin is warm and dry. No rash noted. He is not diaphoretic.  Psychiatric: He has a normal mood and affect. His behavior is normal.  Nursing note and vitals reviewed.   Results for orders placed or performed in visit on 10/16/17  HM DIABETES EYE EXAM  Result Value Ref Range   HM Diabetic Eye Exam No Retinopathy No Retinopathy   Diabetic Foot Exam - Simple   Simple Foot Form Diabetic Foot exam was performed with the following findings:  Yes 11/25/2017  8:56 AM  Visual Inspection No deformities, no ulcerations, no other skin breakdown bilaterally:  Yes Sensation Testing Intact to touch and monofilament testing bilaterally:  Yes Pulse Check Posterior Tibialis and Dorsalis pulse intact bilaterally:  Yes Comments Patient has some dried scaly cracking skin but no deep cracks going all the way through, consistent with tinea pedis, recommended antifungal foot powder or spray over-the-counter.  Patient also has thickened yellow toenails on both great toes and the next 2 toes on his left foot.  Discussed benefits of a 3-57-monthcourse  of oral antifungal, patient will consider        Assessment & Plan:   Problem List Items Addressed This Visit      Cardiovascular and Mediastinum   Hypertension associated with diabetes ([place]   Relevant Medications   rosuvastatin (CRESTOR) 40 MG tablet   lisinopril-hydrochlorothiazide (PRINZIDE,ZESTORETIC) 20-12.5 MG tablet   glipiZIDE (GLUCOTROL XL) 10 MG 24 hr tablet   fenofibrate 160 MG tablet   Other Relevant Orders   BMP8+EGFR     Endocrine   Type II diabetes mellitus (HCC) - Primary (Chronic)   Relevant Medications   rosuvastatin (CRESTOR) 40 MG tablet   lisinopril-hydrochlorothiazide (PRINZIDE,ZESTORETIC) 20-12.5 MG tablet   glipiZIDE (GLUCOTROL XL) 10 MG 24 hr tablet   Other Relevant Orders   Microalbumin / creatinine urine ratio   Bayer DCA Hb A1c Waived   BMP8+EGFR  Hyperlipidemia associated with type 2 diabetes mellitus (HCC)   Relevant Medications   rosuvastatin (CRESTOR) 40 MG tablet   lisinopril-hydrochlorothiazide (PRINZIDE,ZESTORETIC) 20-12.5 MG tablet   glipiZIDE (GLUCOTROL XL) 10 MG 24 hr tablet   fenofibrate 160 MG tablet     Other   BMI 28.0-28.9,adult    Other Visit Diagnoses    Tinea pedis of both feet       Onychomycosis           Follow up plan: Return in about 3 months (around 02/24/2018), or if symptoms worsen or fail to improve, for Hypertension and diabetes and cholesterol.  Counseling provided for all of the vaccine components Orders Placed This Encounter  Procedures  . Microalbumin / creatinine urine ratio  . Bayer DCA Hb A1c Waived  . BMP8+EGFR    Caryl Pina, MD [facility] 11/25/2017, 8:50 AM

## 2017-11-25 NOTE — Addendum Note (Signed)
Addended by: Michaela Corner on: 11/25/2017 11:43 AM   Modules accepted: Orders

## 2017-11-25 NOTE — Addendum Note (Signed)
Addended by: Caryl Pina on: 11/25/2017 01:04 PM   Modules accepted: Orders

## 2017-11-26 LAB — MICROALBUMIN / CREATININE URINE RATIO
CREATININE, UR: 103.5 mg/dL
Microalb/Creat Ratio: 4.1 mg/g creat (ref 0.0–30.0)
Microalbumin, Urine: 4.2 ug/mL

## 2017-11-27 ENCOUNTER — Ambulatory Visit (INDEPENDENT_AMBULATORY_CARE_PROVIDER_SITE_OTHER): Payer: Medicare Other | Admitting: Family Medicine

## 2017-11-27 ENCOUNTER — Encounter: Payer: Self-pay | Admitting: Family Medicine

## 2017-11-27 ENCOUNTER — Ambulatory Visit (INDEPENDENT_AMBULATORY_CARE_PROVIDER_SITE_OTHER): Payer: Medicare Other

## 2017-11-27 ENCOUNTER — Telehealth: Payer: Self-pay

## 2017-11-27 VITALS — BP 131/73 | HR 65 | Temp 97.2°F | Ht 69.0 in | Wt 194.0 lb

## 2017-11-27 DIAGNOSIS — M7122 Synovial cyst of popliteal space [Baker], left knee: Secondary | ICD-10-CM

## 2017-11-27 DIAGNOSIS — M25562 Pain in left knee: Secondary | ICD-10-CM | POA: Diagnosis not present

## 2017-11-27 DIAGNOSIS — M1712 Unilateral primary osteoarthritis, left knee: Secondary | ICD-10-CM

## 2017-11-27 LAB — PSA, TOTAL AND FREE
PSA, Free Pct: 42 %
PSA, Free: 0.21 ng/mL
Prostate Specific Ag, Serum: 0.5 ng/mL (ref 0.0–4.0)

## 2017-11-27 LAB — SPECIMEN STATUS REPORT

## 2017-11-27 MED ORDER — MELOXICAM 15 MG PO TABS: 15.0000 mg | ORAL_TABLET | Freq: Every day | ORAL | 5 refills | Status: DC

## 2017-11-27 NOTE — Telephone Encounter (Signed)
Patient was in to see PM provider and when going over med list they stated that he didn't take flomax. It appears to have been sent in 2 days ago at last visit but patient doesn't know anything about it. Can you review and advise. Saw [name]

## 2017-11-27 NOTE — Progress Notes (Signed)
Chief Complaint  Patient presents with  . Knee Pain    Left No injury    HPI  Patient presents today for one month of increasing pain in the left knee. There is know known injury. Pain is moderate, 6-7/10.  PMH: Smoking status noted ROS: Per HPI  Objective: BP 131/73   Pulse 65   Temp (!) 97.2 F (36.2 C) (Oral)   Ht 5\' 9"  (1.753 m)   Wt 194 lb (88 kg)   BMI 28.65 kg/m  Gen: NAD, alert, cooperative with exam HEENT: NCAT, EOMI, PERRL CV: RRR, good S1/S2, no murmur Resp: CTABL, no wheezes, non-labored Abd: SNTND, BS present, no guarding or organomegaly Ext: No edema, warm Exam theleft knee has full range of motion without discomfort actively and passively. Gait is normal without a limp. The joint lines are nontender. The patella is palpable without tenderness or edema.  Lachman / anterior drawer signs are negative for signs of instability and pain free. McMurray testing reveals no pop or excessive discomfort. Varus and valgus stree maneuvers do not cause ligamentous stretch or instability. Neuro: Alert and oriented, No gross deficits  Assessment and plan:  1. Acute pain of left knee   2. Primary osteoarthritis of left knee   3. Baker cyst, left     Meds ordered this encounter  Medications  . meloxicam (MOBIC) 15 MG tablet    Sig: Take 1 tablet (15 mg total) by mouth daily. For joint and muscle pain    Dispense:  30 tablet    Refill:  5    Orders Placed This Encounter  Procedures  . DG Knee 1-2 Views Left    Standing Status:   Future    Number of Occurrences:   1    Standing Expiration Date:   01/27/2019    Order Specific Question:   Reason for Exam (SYMPTOM  OR DIAGNOSIS REQUIRED)    Answer:   pain    Order Specific Question:   Preferred imaging location?    Answer:   Internal    Follow up as needed.  Claretta Fraise, MD

## 2017-11-28 NOTE — Telephone Encounter (Signed)
Flomax is used to treat Urinary frequency or BPH. If he is not having these symptoms he does not need to take this. It looks like Urinary frequency was discussed during his last visit to Dr. Warrick Parisian.

## 2017-11-28 NOTE — Telephone Encounter (Addendum)
Per pt urinary frequency was not discussed Pt has never taken Flomax Please review and advise Pt is aware Dr D is out of the office and will not be back until next Tuesday

## 2017-12-01 NOTE — Telephone Encounter (Signed)
I may have mixed up which of the patients that morning I was seen and discussed 1 of them with Flomax and 1 of them not with Flomax and when I was putting orders and I may have sent it to the wrong patient.  Please disregard the Flomax

## 2017-12-05 ENCOUNTER — Encounter: Payer: Self-pay | Admitting: Family Medicine

## 2017-12-17 ENCOUNTER — Telehealth: Payer: Self-pay | Admitting: Family Medicine

## 2017-12-18 NOTE — Telephone Encounter (Signed)
Wife aware and verbalizes understanding.  

## 2017-12-24 ENCOUNTER — Telehealth: Payer: Self-pay | Admitting: Family Medicine

## 2017-12-26 NOTE — Telephone Encounter (Signed)
Pt notified samples are ready for pick-up.  

## 2018-01-23 ENCOUNTER — Telehealth: Payer: Self-pay | Admitting: Family Medicine

## 2018-01-23 NOTE — Telephone Encounter (Signed)
PT wife has called to see if we have samples of Jardiance

## 2018-01-24 MED ORDER — EMPAGLIFLOZIN 10 MG PO TABS: 10.0000 mg | ORAL_TABLET | Freq: Every day | ORAL | 0 refills | Status: DC

## 2018-01-24 NOTE — Telephone Encounter (Signed)
Pt aware, samples upfront

## 2018-02-03 ENCOUNTER — Telehealth: Payer: Self-pay | Admitting: Family Medicine

## 2018-02-03 NOTE — Telephone Encounter (Signed)
Patient also requested that we write for 6 months worth of RFs because they have to have all this paperwork filled out so often. Advised that I would ask

## 2018-02-03 NOTE — Telephone Encounter (Signed)
Patient states that she needs to bring paperwork by for her husband to be filled out again for [place]. He received it free through this program. Advised to drop it off at the front window. Patient verbalized understanding.

## 2018-02-03 NOTE — Telephone Encounter (Signed)
Pt wife is wanting to speak to Jan about a paper that she is needing to get filled out for her husband.

## 2018-02-27 ENCOUNTER — Telehealth: Payer: Self-pay | Admitting: *Deleted

## 2018-02-27 DIAGNOSIS — E119 Type 2 diabetes mellitus without complications: Secondary | ICD-10-CM

## 2018-02-27 MED ORDER — SITAGLIPTIN PHOS-METFORMIN HCL 50-1000 MG PO TABS: 1.0000 | ORAL_TABLET | Freq: Two times a day (BID) | ORAL | 0 refills | Status: DC

## 2018-02-27 NOTE — Telephone Encounter (Signed)
I mailed form to [employer] on 02/05/18 TC to [employer], enrollment form does not have to be done again until 2020 Refills just need to be sent, this can be done either by fax,verbal or escribe Fax # [phone] Will print refill, have signed then fax to [employer]

## 2018-03-04 NOTE — Telephone Encounter (Signed)
Written Rx faxed to [employer] [phone]

## 2018-03-05 ENCOUNTER — Encounter: Payer: Self-pay | Admitting: Family Medicine

## 2018-03-05 ENCOUNTER — Ambulatory Visit (INDEPENDENT_AMBULATORY_CARE_PROVIDER_SITE_OTHER): Payer: Medicare Other | Admitting: Family Medicine

## 2018-03-05 VITALS — BP 134/69 | HR 59 | Temp 97.6°F | Ht 69.0 in | Wt 190.8 lb

## 2018-03-05 DIAGNOSIS — E1159 Type 2 diabetes mellitus with other circulatory complications: Secondary | ICD-10-CM | POA: Diagnosis not present

## 2018-03-05 DIAGNOSIS — E785 Hyperlipidemia, unspecified: Secondary | ICD-10-CM

## 2018-03-05 DIAGNOSIS — Z6828 Body mass index (BMI) 28.0-28.9, adult: Secondary | ICD-10-CM

## 2018-03-05 DIAGNOSIS — I10 Essential (primary) hypertension: Secondary | ICD-10-CM

## 2018-03-05 DIAGNOSIS — E1169 Type 2 diabetes mellitus with other specified complication: Secondary | ICD-10-CM | POA: Diagnosis not present

## 2018-03-05 LAB — BAYER DCA HB A1C WAIVED: HB A1C (BAYER DCA - WAIVED): 7 % — ABNORMAL HIGH (ref <7.0)

## 2018-03-05 MED ORDER — MELOXICAM 15 MG PO TABS: 15.0000 mg | ORAL_TABLET | Freq: Every day | ORAL | 6 refills | Status: DC

## 2018-03-05 MED ORDER — FENOFIBRATE 160 MG PO TABS: 160.0000 mg | ORAL_TABLET | Freq: Every day | ORAL | 3 refills | Status: DC

## 2018-03-05 MED ORDER — SITAGLIPTIN PHOS-METFORMIN HCL 50-1000 MG PO TABS: 1.0000 | ORAL_TABLET | Freq: Two times a day (BID) | ORAL | 3 refills | Status: DC

## 2018-03-05 MED ORDER — GLIPIZIDE ER 10 MG PO TB24: 10.0000 mg | ORAL_TABLET | Freq: Every day | ORAL | 3 refills | Status: DC

## 2018-03-05 MED ORDER — ROSUVASTATIN CALCIUM 40 MG PO TABS: 40.0000 mg | ORAL_TABLET | Freq: Every day | ORAL | 3 refills | Status: DC

## 2018-03-05 NOTE — Progress Notes (Signed)
BP 134/69   Pulse (!) 59   Temp 97.6 F (36.4 C) (Oral)   Ht 5' 9" (1.753 m)   Wt 190 lb 12.8 oz (86.5 kg)   BMI 28.18 kg/m    Subjective:    Patient ID: Philip Huang, male    DOB: Dec 18, 1950, 67 y.o.   MRN: [id number]  HPI: Philip Huang is a 67 y.o. male presenting on 03/05/2018 for Diabetes (3 month follow up) and Hypertension   HPI Type 2 diabetes mellitus Patient comes in today for recheck of his diabetes. Patient has been currently taking Janumet and Jardiance and glipizide. Patient is currently on an ACE inhibitor/ARB. Patient has not seen an ophthalmologist this year. Patient denies any issues with their feet.   Hyperlipidemia Patient is coming in for recheck of his hyperlipidemia. The patient is currently taking Crestor. They deny any issues with myalgias or history of liver damage from it. They deny any focal numbness or weakness or chest pain.   Hypertension Patient is currently on lisinopril-hydrochlorothiazide, and their blood pressure today is 134/69. Patient denies any lightheadedness or dizziness. Patient denies headaches, blurred vision, chest pains, shortness of breath, or weakness. Denies any side effects from medication and is content with current medication.   Relevant past medical, surgical, family and social history reviewed and updated as indicated. Interim medical history since our last visit reviewed. Allergies and medications reviewed and updated.  Review of Systems  Constitutional: Negative for chills and fever.  Eyes: Negative for discharge.  Respiratory: Negative for shortness of breath and wheezing.   Cardiovascular: Negative for chest pain and leg swelling.  Musculoskeletal: Negative for back pain and gait problem.  Skin: Negative for rash.  Neurological: Negative for dizziness, seizures, weakness and numbness.  All other systems reviewed and are negative.   Per HPI unless specifically indicated above   Allergies as of  03/05/2018      Reactions   Doxycycline    Sulfa Antibiotics Rash      Medication List        Accurate as of 03/05/18  9:22 AM. Always use your most recent med list.          ACCU-CHEK AVIVA PLUS test strip Generic drug:  glucose blood USE TO CHECK BLOOD SUGAR TWICE DAILY AND AS NEEDED   aspirin 81 MG tablet Take 325 mg by mouth daily.   blood glucose meter kit and supplies Kit Accucheck Meter. Check BS BID and prn Dx Code E11.9   empagliflozin 10 MG Tabs tablet Commonly known as:  JARDIANCE Take 10 mg by mouth daily. Samples   fenofibrate 160 MG tablet Take 1 tablet (160 mg total) by mouth daily.   fish oil-omega-3 fatty acids 1000 MG capsule Take 1 g by mouth daily.   glipiZIDE 10 MG 24 hr tablet Commonly known as:  GLUCOTROL XL Take 1 tablet (10 mg total) by mouth daily with breakfast.   LIFESCAN UNISTIK 2 Misc 1 strip by Does not apply route daily. Test 1X per day-- dx 250.02   lisinopril-hydrochlorothiazide 20-12.5 MG tablet Commonly known as:  PRINZIDE,ZESTORETIC Take 2 tablets by mouth daily.   meloxicam 15 MG tablet Commonly known as:  MOBIC Take 1 tablet (15 mg total) by mouth daily. For joint and muscle pain   MENS 50+ ADVANCED PO Take by mouth.   nitroGLYCERIN 0.4 MG SL tablet Commonly known as:  NITROSTAT Place 1 tablet (0.4 mg total) under the tongue every 5 (five) minutes as  needed.   rosuvastatin 40 MG tablet Commonly known as:  CRESTOR Take 1 tablet (40 mg total) by mouth daily.   Salicylic Acid 50 % Soln by Does not apply route.   sitaGLIPtin-metformin 50-1000 MG tablet Commonly known as:  JANUMET Take 1 tablet by mouth 2 (two) times daily with a meal.   triamcinolone cream 0.1 % Commonly known as:  KENALOG Apply 1 application topically 2 (two) times daily.   vitamin C 500 MG tablet Commonly known as:  ASCORBIC ACID Take 500 mg by mouth daily.   Vitamin D 2000 units tablet Take 2,000 Units by mouth daily.            Objective:    BP 134/69   Pulse (!) 59   Temp 97.6 F (36.4 C) (Oral)   Ht 5' 9" (1.753 m)   Wt 190 lb 12.8 oz (86.5 kg)   BMI 28.18 kg/m   Wt Readings from Last 3 Encounters:  03/05/18 190 lb 12.8 oz (86.5 kg)  11/27/17 194 lb (88 kg)  11/25/17 194 lb (88 kg)    Physical Exam  Constitutional: He is oriented to person, place, and time. He appears well-developed and well-nourished. No distress.  Eyes: Conjunctivae are normal. No scleral icterus.  Neck: Neck supple. No thyromegaly present.  Cardiovascular: Normal rate, regular rhythm, normal heart sounds and intact distal pulses.  No murmur heard. Pulmonary/Chest: Effort normal and breath sounds normal. No respiratory distress. He has no wheezes.  Musculoskeletal: Normal range of motion. He exhibits no edema.  Lymphadenopathy:    He has no cervical adenopathy.  Neurological: He is alert and oriented to person, place, and time. Coordination normal.  Skin: Skin is warm and dry. No rash noted. He is not diaphoretic.  Psychiatric: He has a normal mood and affect. His behavior is normal.  Nursing note and vitals reviewed.       Assessment & Plan:   Problem List Items Addressed This Visit      Cardiovascular and Mediastinum   Hypertension associated with diabetes ([place])   Relevant Medications   rosuvastatin (CRESTOR) 40 MG tablet   fenofibrate 160 MG tablet   glipiZIDE (GLUCOTROL XL) 10 MG 24 hr tablet   sitaGLIPtin-metformin (JANUMET) 50-1000 MG tablet     Endocrine   Type II diabetes mellitus (HCC) - Primary (Chronic)   Relevant Medications   rosuvastatin (CRESTOR) 40 MG tablet   glipiZIDE (GLUCOTROL XL) 10 MG 24 hr tablet   sitaGLIPtin-metformin (JANUMET) 50-1000 MG tablet   Other Relevant Orders   Bayer DCA Hb A1c Waived (Completed)   Hyperlipidemia associated with type 2 diabetes mellitus (HCC)   Relevant Medications   rosuvastatin (CRESTOR) 40 MG tablet   fenofibrate 160 MG tablet   glipiZIDE (GLUCOTROL XL) 10  MG 24 hr tablet   sitaGLIPtin-metformin (JANUMET) 50-1000 MG tablet     Other   BMI 28.0-28.9,adult       Follow up plan: Return in about 3 months (around 06/05/2018), or if symptoms worsen or fail to improve, for Type 2 diabetes.  Counseling provided for all of the vaccine components Orders Placed This Encounter  Procedures  . Bayer [facility] Hb A1c [place], MD [facility] 03/05/2018, 9:22 AM

## 2018-03-21 ENCOUNTER — Encounter: Payer: Medicare Other | Admitting: Family Medicine

## 2018-04-11 ENCOUNTER — Ambulatory Visit (INDEPENDENT_AMBULATORY_CARE_PROVIDER_SITE_OTHER): Payer: Medicare Other | Admitting: Family Medicine

## 2018-04-11 ENCOUNTER — Encounter: Payer: Self-pay | Admitting: Family Medicine

## 2018-04-11 VITALS — BP 136/72 | HR 64 | Temp 97.2°F | Ht 69.0 in | Wt 192.6 lb

## 2018-04-11 DIAGNOSIS — Z0000 Encounter for general adult medical examination without abnormal findings: Secondary | ICD-10-CM

## 2018-04-11 DIAGNOSIS — Z9189 Other specified personal risk factors, not elsewhere classified: Secondary | ICD-10-CM

## 2018-04-11 NOTE — Progress Notes (Signed)
Subjective:   Philip Huang is a 67 y.o. male who presents for a Welcome to Medicare exam.   Review of Systems: Review of Systems  Constitutional: Negative for chills and fever.  HENT: Negative for ear pain and tinnitus.   Eyes: Negative for pain.  Respiratory: Negative for cough, shortness of breath and wheezing.   Cardiovascular: Negative for chest pain, palpitations and leg swelling.  Gastrointestinal: Negative for abdominal pain, blood in stool, constipation, diarrhea and melena.  Genitourinary: Negative for dysuria and hematuria.  Musculoskeletal: Negative for back pain, joint pain and myalgias.  Skin: Negative for rash.  Neurological: Negative for dizziness, sensory change, focal weakness, weakness and headaches.  Psychiatric/Behavioral: Negative for depression and suicidal ideas.    Cardiac Risk Factors include: diabetes mellitus;advanced age (>38mn, >>9women);male gender;hypertension  The 10-year ASCVD risk score (Mikey BussingDC JBrooke Bonito, et al., 2013) is: 34%   Values used to calculate the score:     Age: [age]years     Sex: Male     Is Non-Hispanic African American: No     Diabetic: Yes     Tobacco smoker: No     Systolic Blood Pressure: 1742mmHg     Is BP treated: Yes     HDL Cholesterol: 35 mg/dL     Total Cholesterol: 193 mg/dL     Objective:    Today's Vitals   04/11/18 1304 04/11/18 1306  BP: (!) 143/73 136/72  Pulse: 64   Temp: (!) 97.2 F (36.2 C)   TempSrc: Oral   Weight: 192 lb 9.6 oz (87.4 kg)   Height: 5' 9"  (1.753 m)    Body mass index is 28.44 kg/m.  Medications Outpatient Encounter Medications as of 04/11/2018  Medication Sig  . ACCU-CHEK AVIVA PLUS test strip USE TO CHECK BLOOD SUGAR TWICE DAILY AND AS NEEDED  . aspirin 81 MG tablet Take 325 mg by mouth daily.   . blood glucose meter kit and supplies KIT Accucheck Meter. Check BS BID and prn Dx Code E11.9  . Cholecalciferol (VITAMIN D) 2000 UNITS tablet Take 2,000 Units by mouth daily.      . empagliflozin (JARDIANCE) 10 MG TABS tablet Take 10 mg by mouth daily. Samples  . fenofibrate 160 MG tablet Take 1 tablet (160 mg total) by mouth daily.  . fish oil-omega-3 fatty acids 1000 MG capsule Take 1 g by mouth daily.  .Marland KitchenglipiZIDE (GLUCOTROL XL) 10 MG 24 hr tablet Take 1 tablet (10 mg total) by mouth daily with breakfast.  . Lancets (LIFESCAN UNISTIK 2) MISC 1 strip by Does not apply route daily. Test 1X per day-- dx 250.02  . lisinopril-hydrochlorothiazide (PRINZIDE,ZESTORETIC) 20-12.5 MG tablet Take 2 tablets by mouth daily.  . meloxicam (MOBIC) 15 MG tablet Take 1 tablet (15 mg total) by mouth daily. For joint and muscle pain  . Multiple Vitamins-Minerals (MENS 50+ ADVANCED PO) Take by mouth.    . nitroGLYCERIN (NITROSTAT) 0.4 MG SL tablet Place 1 tablet (0.4 mg total) under the tongue every 5 (five) minutes as needed.  . rosuvastatin (CRESTOR) 40 MG tablet Take 1 tablet (40 mg total) by mouth daily.  . Salicylic Acid 50 % SOLN by Does not apply route.  . sitaGLIPtin-metformin (JANUMET) 50-1000 MG tablet Take 1 tablet by mouth 2 (two) times daily with a meal.  . triamcinolone cream (KENALOG) 0.1 % Apply 1 application topically 2 (two) times daily.  . vitamin C (ASCORBIC ACID) 500 MG tablet Take 500 mg  by mouth daily.     No facility-administered encounter medications on file as of 04/11/2018.      History: Past Medical History:  Diagnosis Date  . Diabetes mellitus   . HTN (hypertension)   . Hyperlipidemia   . Overweight(278.02)   . Squamous acanthoma of upper extremity    Past Surgical History:  Procedure Laterality Date  . None    . squamous acanthoma of upper extremity      Family History  Problem Relation Age of Onset  . Diabetes Mother   . Heart disease Mother 39       CABG  . Hyperlipidemia Mother   . Hypertension Mother   . Parkinsonism Father   . Diabetes Sister   . Hyperlipidemia Sister   . Hypertension Sister   . Diabetes Brother   . Hyperlipidemia  Brother   . Hypertension Brother    Social History   Occupational History    Employer: [employer]  Tobacco Use  . Smoking status: Former Smoker    Last attempt to quit: 08/06/1962    Years since quitting: 55.7  . Smokeless tobacco: Never Used  Substance and Sexual Activity  . Alcohol use: No  . Drug use: No  . Sexual activity: Not on file   Tobacco Counseling Counseling given: Not Answered   Immunizations and Health Maintenance Immunization History  Administered Date(s) Administered  . Influenza,inj,Quad PF,6+ Mos 04/30/2015, 04/20/2017  . Influenza-Unspecified 05/20/2016  . Pneumococcal Conjugate-13 08/26/2017, 08/26/2017  . Pneumococcal Polysaccharide-23 05/06/2001, 05/06/2001  . Td 08/06/2005, 06/29/2013  . Zoster 10/28/2013   Health Maintenance Due  Topic Date Due  . COLON CANCER SCREENING ANNUAL FOBT  03/18/2016    Activities of Daily Living In your present state of health, do you have any difficulty performing the following activities: 04/11/2018  Hearing? N  Vision? N  Difficulty concentrating or making decisions? N  Walking or climbing stairs? N  Dressing or bathing? N  Doing errands, shopping? N  Preparing Food and eating ? N  Using the Toilet? N  In the past six months, have you accidently leaked urine? N  Do you have problems with loss of bowel control? N  Managing your Medications? N  Managing your Finances? N  Housekeeping or managing your Housekeeping? N  Some recent data might be hidden    Physical Exam  Constitutional: He is oriented to person, place, and time. He appears well-developed and well-nourished. No distress.  Eyes: Conjunctivae are normal. No scleral icterus.  Cardiovascular: Normal rate, regular rhythm, normal heart sounds and intact distal pulses.  No murmur heard. Pulmonary/Chest: Effort normal and breath sounds normal. No respiratory distress. He has no wheezes.  Neurological: He is alert and oriented to person, place, and  time. Coordination normal.  Skin: Skin is warm and dry. No rash noted. He is not diaphoretic.  Psychiatric: He has a normal mood and affect. His behavior is normal.  Nursing note and vitals reviewed.  (optional), or other factors deemed appropriate based on the beneficiary's medical and social history and current clinical standards.  Advanced Directives: Does Patient Have a Medical Advance Directive?: Yes Type of Advance Directive: [place] in Chart?: No - copy requested    Assessment:    This is a routine wellness  examination for this patient.  Vision/Hearing screen No exam data present  Dietary issues and exercise activities discussed:  Current Exercise Habits: Home exercise routine, Type of exercise:  walking, Time (Minutes): 10, Frequency (Times/Week): >7, Weekly Exercise (Minutes/Week): 0, Intensity: Mild  Goals   None     Depression Screen PHQ 2/9 Scores 04/11/2018 03/05/2018 11/27/2017 11/25/2017  PHQ - 2 Score 0 0 0 0     Fall Risk Fall Risk  04/11/2018  Falls in the past year? No    Cognitive Function MMSE - Mini Mental State Exam 04/11/2018 04/11/2018  Orientation to time 4 4  Orientation to Place 5 3  Registration 3 3  Attention/ Calculation 4 4  Recall 2 2  Language- name 2 objects 2 2  Language- repeat 1 1  Language- follow 3 step command 3 3  Language- read & follow direction 1 1  Write a sentence 1 1  Copy design 1 1  Total score 27 25        Patient Care Team: [name], Fransisca Kaufmann, MD as PCP - General (Family Medicine)     Plan:     Problem List Items Addressed This Visit    None    Visit Diagnoses    Encounter for Medicare annual wellness exam    -  Primary   [place] cardiac risk >20% in next 10 years          Patient also had some tinea corporis just under his right breast region, recommended topical jock itch or athlete's foot cream.  I have personally reviewed and noted the  following in the patient's chart:   . Medical and social history . Use of alcohol, tobacco or illicit drugs  . Current medications and supplements . Functional ability and status . Nutritional status . Physical activity . Advanced directives . List of other physicians . Hospitalizations, surgeries, and ER visits in previous 12 months . Vitals . Screenings to include cognitive, depression, and falls . Referrals and appointments  In addition, I have reviewed and discussed with patient certain preventive protocols, quality metrics, and best practice recommendations. A written personalized care plan for preventive services as well as general preventive health recommendations were provided to patient.    Worthy Rancher, MD 04/11/2018

## 2018-04-28 DIAGNOSIS — Z23 Encounter for immunization: Secondary | ICD-10-CM | POA: Diagnosis not present

## 2018-06-04 ENCOUNTER — Other Ambulatory Visit: Payer: Self-pay | Admitting: Family Medicine

## 2018-06-04 NOTE — Telephone Encounter (Signed)
Pt aware - samples up front  

## 2018-07-11 ENCOUNTER — Telehealth: Payer: Self-pay | Admitting: Family Medicine

## 2018-07-11 NOTE — Telephone Encounter (Signed)
Advised pt when he comes in for his appt Dr [name] will order any bloodwork that is needed.

## 2018-07-11 NOTE — Telephone Encounter (Signed)
PT is scheduled to come in on 12/12 for diabetic check, pt will come in that morning to do labs please also check his cholesterol, think it has been awhile since that has been checked.

## 2018-07-17 ENCOUNTER — Other Ambulatory Visit: Payer: Self-pay | Admitting: Family

## 2018-07-17 ENCOUNTER — Ambulatory Visit: Payer: Medicare Other | Admitting: Family Medicine

## 2018-07-17 ENCOUNTER — Other Ambulatory Visit: Payer: Medicare Other

## 2018-07-17 DIAGNOSIS — I10 Essential (primary) hypertension: Principal | ICD-10-CM

## 2018-07-17 DIAGNOSIS — E785 Hyperlipidemia, unspecified: Secondary | ICD-10-CM | POA: Diagnosis not present

## 2018-07-17 DIAGNOSIS — E1169 Type 2 diabetes mellitus with other specified complication: Secondary | ICD-10-CM

## 2018-07-17 DIAGNOSIS — E1159 Type 2 diabetes mellitus with other circulatory complications: Secondary | ICD-10-CM | POA: Diagnosis not present

## 2018-07-17 DIAGNOSIS — I152 Hypertension secondary to endocrine disorders: Secondary | ICD-10-CM

## 2018-07-18 LAB — CBC WITH DIFFERENTIAL/PLATELET
BASOS ABS: 0 10*3/uL (ref 0.0–0.2)
BASOS: 1 %
EOS (ABSOLUTE): 0.3 10*3/uL (ref 0.0–0.4)
Eos: 5 %
Hematocrit: 42.3 % (ref 37.5–51.0)
Hemoglobin: 14.3 g/dL (ref 13.0–17.7)
IMMATURE GRANS (ABS): 0 10*3/uL (ref 0.0–0.1)
Immature Granulocytes: 0 %
LYMPHS ABS: 2.3 10*3/uL (ref 0.7–3.1)
LYMPHS: 34 %
MCH: 29.9 pg (ref 26.6–33.0)
MCHC: 33.8 g/dL (ref 31.5–35.7)
MCV: 89 fL (ref 79–97)
MONOS ABS: 0.6 10*3/uL (ref 0.1–0.9)
Monocytes: 9 %
Neutrophils Absolute: 3.6 10*3/uL (ref 1.4–7.0)
Neutrophils: 51 %
Platelets: 239 10*3/uL (ref 150–450)
RBC: 4.78 x10E6/uL (ref 4.14–5.80)
RDW: 13 % (ref 12.3–15.4)
WBC: 6.8 10*3/uL (ref 3.4–10.8)

## 2018-07-18 LAB — CMP14+EGFR
A/G RATIO: 1.5 (ref 1.2–2.2)
ALK PHOS: 46 IU/L (ref 39–117)
ALT: 34 IU/L (ref 0–44)
AST: 24 IU/L (ref 0–40)
Albumin: 4.5 g/dL (ref 3.6–4.8)
BUN/Creatinine Ratio: 18 (ref 10–24)
BUN: 19 mg/dL (ref 8–27)
CHLORIDE: 102 mmol/L (ref 96–106)
CO2: 20 mmol/L (ref 20–29)
Calcium: 9.5 mg/dL (ref 8.6–10.2)
Creatinine, Ser: 1.07 mg/dL (ref 0.76–1.27)
GFR calc non Af Amer: 72 mL/min/{1.73_m2} (ref >59)
GFR, EST AFRICAN AMERICAN: 83 mL/min/{1.73_m2} (ref >59)
GLUCOSE: 127 mg/dL — AB (ref 65–99)
Globulin, Total: 3 g/dL (ref 1.5–4.5)
POTASSIUM: 4.2 mmol/L (ref 3.5–5.2)
Sodium: 144 mmol/L (ref 134–144)
TOTAL PROTEIN: 7.5 g/dL (ref 6.0–8.5)

## 2018-07-18 LAB — LIPID PANEL
CHOL/HDL RATIO: 4.4 ratio (ref 0.0–5.0)
Cholesterol, Total: 171 mg/dL (ref 100–199)
HDL: 39 mg/dL — ABNORMAL LOW (ref >39)
LDL CALC: 109 mg/dL — AB (ref 0–99)
Triglycerides: 116 mg/dL (ref 0–149)
VLDL CHOLESTEROL CAL: 23 mg/dL (ref 5–40)

## 2018-07-18 LAB — BAYER DCA HB A1C WAIVED: HB A1C (BAYER DCA - WAIVED): 7.1 % — ABNORMAL HIGH (ref <7.0)

## 2018-07-25 ENCOUNTER — Ambulatory Visit (INDEPENDENT_AMBULATORY_CARE_PROVIDER_SITE_OTHER): Payer: Medicare Other | Admitting: Family Medicine

## 2018-07-25 ENCOUNTER — Encounter: Payer: Self-pay | Admitting: Family Medicine

## 2018-07-25 VITALS — BP 139/67 | HR 70 | Temp 97.9°F | Ht 69.0 in | Wt 196.0 lb

## 2018-07-25 DIAGNOSIS — E1169 Type 2 diabetes mellitus with other specified complication: Secondary | ICD-10-CM | POA: Diagnosis not present

## 2018-07-25 DIAGNOSIS — I10 Essential (primary) hypertension: Secondary | ICD-10-CM | POA: Diagnosis not present

## 2018-07-25 DIAGNOSIS — E1159 Type 2 diabetes mellitus with other circulatory complications: Secondary | ICD-10-CM | POA: Diagnosis not present

## 2018-07-25 DIAGNOSIS — E785 Hyperlipidemia, unspecified: Secondary | ICD-10-CM | POA: Diagnosis not present

## 2018-07-25 DIAGNOSIS — L309 Dermatitis, unspecified: Secondary | ICD-10-CM | POA: Diagnosis not present

## 2018-07-25 MED ORDER — GLIPIZIDE ER 10 MG PO TB24: 10.0000 mg | ORAL_TABLET | Freq: Every day | ORAL | 3 refills | Status: DC

## 2018-07-25 MED ORDER — CLOTRIMAZOLE 1 % EX CREA: 1.0000 "application " | TOPICAL_CREAM | Freq: Two times a day (BID) | CUTANEOUS | 0 refills | Status: DC

## 2018-07-25 NOTE — Progress Notes (Signed)
BP 139/67   Pulse 70   Temp 97.9 F (36.6 C) (Oral)   Ht 5' 9" (1.753 m)   Wt 196 lb (88.9 kg)   BMI 28.94 kg/m    Subjective:    Patient ID: Philip Huang, male    DOB: Oct 05, 1950, 67 y.o.   MRN: [id number]  HPI: Philip Huang is a 67 y.o. male presenting on 07/25/2018 for Hypertension (3 mo); Diabetes; and Hyperlipidemia   HPI Type 2 diabetes mellitus Patient comes in today for recheck of his diabetes. Patient has been currently taking Jardiance and glipizide and Janumet. Patient is currently on an ACE inhibitor/ARB. Patient has seen an ophthalmologist this year. Patient denies any issues with their feet.  Patient has hypertension and cholesterol is associated illnesses  Hyperlipidemia Patient is coming in for recheck of his hyperlipidemia. The patient is currently taking Crestor. They deny any issues with myalgias or history of liver damage from it. They deny any focal numbness or weakness or chest pain.   Hypertension Patient is currently on lisinopril-hydrochlorothiazide, and their blood pressure today is 139/67. Patient denies any lightheadedness or dizziness. Patient denies headaches, blurred vision, chest pains, shortness of breath, or weakness. Denies any side effects from medication and is content with current medication.   Patient has a rash on his abdomen that has come and go over the past 5 or 6 months and he has been using triamcinolone cream on it which does not seem to be helping and has tried cortisone cream as well which does not seem to be helping either.  He denies any redness or warmth or drainage but just says it is very pruritic.  Relevant past medical, surgical, family and social history reviewed and updated as indicated. Interim medical history since our last visit reviewed. Allergies and medications reviewed and updated.  Review of Systems  Constitutional: Negative for chills and fever.  Eyes: Negative for discharge and visual  disturbance.  Respiratory: Negative for shortness of breath and wheezing.   Cardiovascular: Negative for chest pain and leg swelling.  Musculoskeletal: Negative for back pain and gait problem.  Skin: Positive for rash.  Neurological: Negative for dizziness, weakness, light-headedness and headaches.  All other systems reviewed and are negative.   Per HPI unless specifically indicated above   Allergies as of 07/25/2018      Reactions   Doxycycline    Sulfa Antibiotics Rash      Medication List       Accurate as of July 25, 2018  3:22 PM. Always use your most recent med list.        ACCU-CHEK AVIVA PLUS test strip Generic drug:  glucose blood USE TO CHECK BLOOD SUGAR TWICE DAILY AND AS NEEDED   aspirin 81 MG tablet Take 325 mg by mouth daily.   blood glucose meter kit and supplies Kit Accucheck Meter. Check BS BID and prn Dx Code E11.9   empagliflozin 10 MG Tabs tablet Commonly known as:  JARDIANCE Take 10 mg by mouth daily. Samples   fenofibrate 160 MG tablet Take 1 tablet (160 mg total) by mouth daily.   fish oil-omega-3 fatty acids 1000 MG capsule Take 1 g by mouth daily.   glipiZIDE 10 MG 24 hr tablet Commonly known as:  GLUCOTROL XL Take 1 tablet (10 mg total) by mouth daily with breakfast.   LIFESCAN UNISTIK 2 Misc 1 strip by Does not apply route daily. Test 1X per day-- dx 250.02   lisinopril-hydrochlorothiazide 20-12.5 MG  tablet Commonly known as:  PRINZIDE,ZESTORETIC Take 2 tablets by mouth daily.   meloxicam 15 MG tablet Commonly known as:  MOBIC Take 1 tablet (15 mg total) by mouth daily. For joint and muscle pain   MENS 50+ ADVANCED PO Take by mouth.   nitroGLYCERIN 0.4 MG SL tablet Commonly known as:  NITROSTAT Place 1 tablet (0.4 mg total) under the tongue every 5 (five) minutes as needed.   rosuvastatin 40 MG tablet Commonly known as:  CRESTOR Take 1 tablet (40 mg total) by mouth daily.   Salicylic Acid 50 % Soln by Does not  apply route.   sitaGLIPtin-metformin 50-1000 MG tablet Commonly known as:  JANUMET Take 1 tablet by mouth 2 (two) times daily with a meal.   triamcinolone cream 0.1 % Commonly known as:  KENALOG Apply 1 application topically 2 (two) times daily.   vitamin C 500 MG tablet Commonly known as:  ASCORBIC ACID Take 500 mg by mouth daily.   Vitamin D 50 MCG (2000 UT) tablet Take 2,000 Units by mouth daily.          Objective:    BP 139/67   Pulse 70   Temp 97.9 F (36.6 C) (Oral)   Ht 5' 9" (1.753 m)   Wt 196 lb (88.9 kg)   BMI 28.94 kg/m   Wt Readings from Last 3 Encounters:  07/25/18 196 lb (88.9 kg)  04/11/18 192 lb 9.6 oz (87.4 kg)  03/05/18 190 lb 12.8 oz (86.5 kg)    Physical Exam Vitals signs and nursing note reviewed.  Constitutional:      General: He is not in acute distress.    Appearance: He is well-developed. He is not diaphoretic.  Eyes:     General: No scleral icterus.    Conjunctiva/sclera: Conjunctivae normal.  Neck:     Musculoskeletal: Neck supple.     Thyroid: No thyromegaly.  Cardiovascular:     Rate and Rhythm: Normal rate and regular rhythm.     Heart sounds: Normal heart sounds. No murmur.  Pulmonary:     Effort: Pulmonary effort is normal. No respiratory distress.     Breath sounds: Normal breath sounds. No wheezing.  Musculoskeletal: Normal range of motion.  Lymphadenopathy:     Cervical: No cervical adenopathy.  Skin:    General: Skin is warm and dry.     Findings: Rash (Maculopapular rash on anterior abdomen with small pink papules and no warmth or drainage, blanching) present.  Neurological:     Mental Status: He is alert and oriented to person, place, and time.     Coordination: Coordination normal.  Psychiatric:        Behavior: Behavior normal.     Results for orders placed or performed in visit on 07/17/18  Bayer DCA Hb A1c Waived  Result Value Ref Range   HB A1C (BAYER DCA - WAIVED) 7.1 (H) <7.0 %  Lipid panel  Result  Value Ref Range   Cholesterol, Total 171 100 - 199 mg/dL   Triglycerides 116 0 - 149 mg/dL   HDL 39 (L) >39 mg/dL   VLDL Cholesterol Cal 23 5 - 40 mg/dL   LDL Calculated 109 (H) 0 - 99 mg/dL   Chol/HDL Ratio 4.4 0.0 - 5.0 ratio  CBC with Differential/Platelet  Result Value Ref Range   WBC 6.8 3.4 - 10.8 x10E3/uL   RBC 4.78 4.14 - 5.80 x10E6/uL   Hemoglobin 14.3 13.0 - 17.7 g/dL   Hematocrit 42.3 37.5 -  51.0 %   MCV 89 79 - 97 fL   MCH 29.9 26.6 - 33.0 pg   MCHC 33.8 31.5 - 35.7 g/dL   RDW 13.0 12.3 - 15.4 %   Platelets 239 150 - 450 x10E3/uL   Neutrophils 51 Not Estab. %   Lymphs 34 Not Estab. %   Monocytes 9 Not Estab. %   Eos 5 Not Estab. %   Basos 1 Not Estab. %   Neutrophils Absolute 3.6 1.4 - 7.0 x10E3/uL   Lymphocytes Absolute 2.3 0.7 - 3.1 x10E3/uL   Monocytes Absolute 0.6 0.1 - 0.9 x10E3/uL   EOS (ABSOLUTE) 0.3 0.0 - 0.4 x10E3/uL   Basophils Absolute 0.0 0.0 - 0.2 x10E3/uL   Immature Granulocytes 0 Not Estab. %   Immature Grans (Abs) 0.0 0.0 - 0.1 x10E3/uL  CMP14+EGFR  Result Value Ref Range   Glucose 127 (H) 65 - 99 mg/dL   BUN 19 8 - 27 mg/dL   Creatinine, Ser 1.07 0.76 - 1.27 mg/dL   GFR calc non Af Amer 72 >59 mL/min/1.73   GFR calc Af Amer 83 >59 mL/min/1.73   BUN/Creatinine Ratio 18 10 - 24   Sodium 144 134 - 144 mmol/L   Potassium 4.2 3.5 - 5.2 mmol/L   Chloride 102 96 - 106 mmol/L   CO2 20 20 - 29 mmol/L   Calcium 9.5 8.6 - 10.2 mg/dL   Total Protein 7.5 6.0 - 8.5 g/dL   Albumin 4.5 3.6 - 4.8 g/dL   Globulin, Total 3.0 1.5 - 4.5 g/dL   Albumin/Globulin Ratio 1.5 1.2 - 2.2   Bilirubin Total <0.2 0.0 - 1.2 mg/dL   Alkaline Phosphatase 46 39 - 117 IU/L   AST 24 0 - 40 IU/L   ALT 34 0 - 44 IU/L      Assessment & Plan:   Problem List Items Addressed This Visit      Cardiovascular and Mediastinum   Hypertension associated with diabetes (HCC)   Relevant Medications   glipiZIDE (GLUCOTROL XL) 10 MG 24 hr tablet     Endocrine   Type II diabetes  mellitus (HCC) - Primary (Chronic)   Relevant Medications   glipiZIDE (GLUCOTROL XL) 10 MG 24 hr tablet   Other Relevant Orders   Bayer DCA Hb A1c Waived   Hyperlipidemia associated with type 2 diabetes mellitus (HCC)   Relevant Medications   glipiZIDE (GLUCOTROL XL) 10 MG 24 hr tablet    Other Visit Diagnoses    Dermatitis       Looks like possibly an atopic dermatitis, recommended continue triamcinolone and will try Lotrimin as well   Relevant Medications   clotrimazole (LOTRIMIN AF) 1 % cream      Recommended moisturizer as well as Lotrimin and triamcinolone for rash on abdomen Follow up plan: Return in about 3 months (around 10/24/2018), or if symptoms worsen or fail to improve, for Diabetes and hypertension and cholesterol.  Counseling provided for all of the vaccine components Orders Placed This Encounter  Procedures  . Bayer [facility] Hb A1c Waived    Caryl Pina, MD [facility] 07/25/2018, 3:22 PM

## 2018-08-26 ENCOUNTER — Other Ambulatory Visit: Payer: Self-pay | Admitting: *Deleted

## 2018-08-26 MED ORDER — GLUCOSE BLOOD VI STRP: ORAL_STRIP | 3 refills | Status: DC

## 2018-08-26 NOTE — Telephone Encounter (Signed)
Fax from [facility] on test strips, insurance will not pay for as needed Rx sent without the 'as needed'

## 2018-09-15 ENCOUNTER — Telehealth: Payer: Self-pay | Admitting: Family Medicine

## 2018-09-15 NOTE — Telephone Encounter (Signed)
Samples placed up front- wife aware

## 2018-09-22 ENCOUNTER — Ambulatory Visit (INDEPENDENT_AMBULATORY_CARE_PROVIDER_SITE_OTHER): Payer: Medicare Other | Admitting: Family

## 2018-09-22 ENCOUNTER — Encounter: Payer: Self-pay | Admitting: Family

## 2018-09-22 VITALS — BP 135/72 | HR 64 | Temp 97.1°F | Ht 69.0 in | Wt 195.2 lb

## 2018-09-22 DIAGNOSIS — R6889 Other general symptoms and signs: Secondary | ICD-10-CM

## 2018-09-22 DIAGNOSIS — Z20828 Contact with and (suspected) exposure to other viral communicable diseases: Secondary | ICD-10-CM

## 2018-09-22 LAB — VERITOR FLU A/B WAIVED
INFLUENZA A: NEGATIVE
Influenza B: NEGATIVE

## 2018-09-22 MED ORDER — OSELTAMIVIR PHOSPHATE 75 MG PO CAPS: 75.0000 mg | ORAL_CAPSULE | Freq: Two times a day (BID) | ORAL | 0 refills | Status: DC

## 2018-09-22 NOTE — Progress Notes (Signed)
Subjective:    Patient ID: Philip Huang, male    DOB: 1951/05/15, 68 y.o.   MRN: [id number]  Chief Complaint  Patient presents with  . Nasal Congestion  . Headache  . chest congestion    Cough  This is a new problem. The current episode started yesterday. The problem has been gradually worsening. The problem occurs every few minutes. The cough is non-productive. Associated symptoms include headaches, myalgias, nasal congestion and postnasal drip. Pertinent negatives include no chills, ear congestion, ear pain, fever, rhinorrhea, sore throat, shortness of breath or wheezing. He has tried rest and OTC cough suppressant for the symptoms. The treatment provided mild relief. There is no history of asthma or COPD.      Review of Systems  Constitutional: Negative for chills and fever.  HENT: Positive for postnasal drip. Negative for ear pain, rhinorrhea and sore throat.   Respiratory: Positive for cough. Negative for shortness of breath and wheezing.   Musculoskeletal: Positive for myalgias.  Neurological: Positive for headaches.  All other systems reviewed and are negative.      Objective:   Physical Exam Vitals signs reviewed.  Constitutional:      General: He is not in acute distress.    Appearance: He is well-developed.  HENT:     Head: Normocephalic.     Right Ear: External ear normal.     Left Ear: External ear normal. Tympanic membrane is erythematous.     Nose: Mucosal edema present.     Mouth/Throat:     Pharynx: Posterior oropharyngeal erythema present.  Eyes:     General:        Right eye: No discharge.        Left eye: No discharge.     Pupils: Pupils are equal, round, and reactive to light.  Neck:     Musculoskeletal: Normal range of motion and neck supple.     Thyroid: No thyromegaly.  Cardiovascular:     Rate and Rhythm: Normal rate and regular rhythm.     Heart sounds: Normal heart sounds. No murmur.  Pulmonary:     Effort: Pulmonary effort is  normal. No respiratory distress.     Breath sounds: Normal breath sounds. No wheezing.  Abdominal:     General: Bowel sounds are normal. There is no distension.     Palpations: Abdomen is soft.     Tenderness: There is no abdominal tenderness.  Musculoskeletal: Normal range of motion.        General: No tenderness.  Skin:    General: Skin is warm and dry.     Findings: No erythema or rash.  Neurological:     Mental Status: He is alert and oriented to person, place, and time.     Cranial Nerves: No cranial nerve deficit.     Deep Tendon Reflexes: Reflexes are normal and symmetric.  Psychiatric:        Behavior: Behavior normal.        Thought Content: Thought content normal.        Judgment: Judgment normal.     BP 135/72   Pulse 64   Temp (!) 97.1 F (36.2 C) (Oral)   Ht 5\' 9"  (1.753 m)   Wt 195 lb 3.2 oz (88.5 kg)   BMI 28.83 kg/m      Assessment & Plan:  Rmani Kellogg comes in today with chief complaint of Nasal Congestion; Headache; and chest congestion   Diagnosis and orders addressed:  1.  Flu-like symptoms - Veritor Flu A/B Waived  2. Exposure to the flu Force fluids Droplet precautions discussed Tylenol as needed Rest RTO if symptoms worsen or do not improve  - oseltamivir (TAMIFLU) 75 MG capsule; Take 1 capsule (75 mg total) by mouth 2 (two) times daily.  Dispense: 10 capsule; Refill: 0   Evelina Dun, FNP

## 2018-09-22 NOTE — Patient Instructions (Signed)

## 2018-10-18 ENCOUNTER — Telehealth: Payer: Self-pay | Admitting: Family Medicine

## 2018-10-20 NOTE — Telephone Encounter (Signed)
Samples up front, wife aware.

## 2018-12-10 ENCOUNTER — Telehealth: Payer: Self-pay | Admitting: Family Medicine

## 2018-12-10 NOTE — Telephone Encounter (Signed)
Instructed pt to continue to call for samples

## 2018-12-15 ENCOUNTER — Encounter: Payer: Self-pay | Admitting: Family Medicine

## 2018-12-15 ENCOUNTER — Ambulatory Visit (INDEPENDENT_AMBULATORY_CARE_PROVIDER_SITE_OTHER): Payer: Medicare Other | Admitting: Family Medicine

## 2018-12-15 ENCOUNTER — Other Ambulatory Visit: Payer: Self-pay

## 2018-12-15 DIAGNOSIS — E1159 Type 2 diabetes mellitus with other circulatory complications: Secondary | ICD-10-CM

## 2018-12-15 DIAGNOSIS — E785 Hyperlipidemia, unspecified: Secondary | ICD-10-CM | POA: Diagnosis not present

## 2018-12-15 DIAGNOSIS — I152 Hypertension secondary to endocrine disorders: Secondary | ICD-10-CM

## 2018-12-15 DIAGNOSIS — E1169 Type 2 diabetes mellitus with other specified complication: Secondary | ICD-10-CM | POA: Diagnosis not present

## 2018-12-15 DIAGNOSIS — I10 Essential (primary) hypertension: Secondary | ICD-10-CM | POA: Diagnosis not present

## 2018-12-15 MED ORDER — SITAGLIPTIN PHOS-METFORMIN HCL 50-1000 MG PO TABS: 1.0000 | ORAL_TABLET | Freq: Two times a day (BID) | ORAL | 3 refills | Status: DC

## 2018-12-15 NOTE — Progress Notes (Signed)
Virtual Visit via telephone Note  I connected with Philip Huang on 12/15/18 at 1453 by telephone and verified that I am speaking with the correct person using two identifiers. Liban Guedes is currently located at home and wife are currently with her during visit. The provider, Fransisca Kaufmann Cristofer Yaffe, MD is located in their office at time of visit.  Call ended at 1507  I discussed the limitations, risks, security and privacy concerns of performing an evaluation and management service by telephone and the availability of in person appointments. I also discussed with the patient that there may be a patient responsible charge related to this service. The patient expressed understanding and agreed to proceed.   History and Present Illness: Type 2 diabetes mellitus Patient comes in today for recheck of his diabetes. Patient has been currently taking jardiance and glipizide and juanumet, BS 91-131. Patient is currently on an ACE inhibitor/ARB. Patient has not seen an ophthalmologist this year. Patient denies any issues with their feet.   Hypertension Patient is currently on lisinopril-hydrochlorothiazide, and their blood pressure today is 130/68. Patient denies any lightheadedness or dizziness. Patient denies headaches, blurred vision, chest pains, shortness of breath, or weakness. Denies any side effects from medication and is content with current medication.   Hyperlipidemia Patient is coming in for recheck of his hyperlipidemia. The patient is currently taking fish oil and crestor. They deny any issues with myalgias or history of liver damage from it. They deny any focal numbness or weakness or chest pain.   No diagnosis found.  Outpatient Encounter Medications as of 12/15/2018  Medication Sig  . aspirin 81 MG tablet Take 325 mg by mouth daily.   . blood glucose meter kit and supplies KIT Accucheck Meter. Check BS BID and prn Dx Code E11.9  . Cholecalciferol (VITAMIN D) 2000  UNITS tablet Take 2,000 Units by mouth daily.    . clotrimazole (LOTRIMIN AF) 1 % cream Apply 1 application topically 2 (two) times daily.  . empagliflozin (JARDIANCE) 10 MG TABS tablet Take 10 mg by mouth daily. Samples  . fenofibrate 160 MG tablet Take 1 tablet (160 mg total) by mouth daily.  . fish oil-omega-3 fatty acids 1000 MG capsule Take 1 g by mouth daily.  Marland Kitchen glipiZIDE (GLUCOTROL XL) 10 MG 24 hr tablet Take 1 tablet (10 mg total) by mouth daily with breakfast.  . glucose blood (ACCU-CHEK AVIVA PLUS) test strip USE TO CHECK BLOOD SUGAR TWICE DAILY  . Lancets (LIFESCAN UNISTIK 2) MISC 1 strip by Does not apply route daily. Test 1X per day-- dx 250.02  . lisinopril-hydrochlorothiazide (PRINZIDE,ZESTORETIC) 20-12.5 MG tablet Take 2 tablets by mouth daily.  . meloxicam (MOBIC) 15 MG tablet Take 1 tablet (15 mg total) by mouth daily. For joint and muscle pain  . Multiple Vitamins-Minerals (MENS 50+ ADVANCED PO) Take by mouth.    . nitroGLYCERIN (NITROSTAT) 0.4 MG SL tablet Place 1 tablet (0.4 mg total) under the tongue every 5 (five) minutes as needed.  Marland Kitchen oseltamivir (TAMIFLU) 75 MG capsule Take 1 capsule (75 mg total) by mouth 2 (two) times daily.  . rosuvastatin (CRESTOR) 40 MG tablet Take 1 tablet (40 mg total) by mouth daily.  . Salicylic Acid 50 % SOLN by Does not apply route.  . sitaGLIPtin-metformin (JANUMET) 50-1000 MG tablet Take 1 tablet by mouth 2 (two) times daily with a meal.  . triamcinolone cream (KENALOG) 0.1 % Apply 1 application topically 2 (two) times daily.  . vitamin C (  ASCORBIC ACID) 500 MG tablet Take 500 mg by mouth daily.     No facility-administered encounter medications on file as of 12/15/2018.     Review of Systems  Constitutional: Negative for chills and fever.  Eyes: Negative for visual disturbance.  Respiratory: Negative for shortness of breath and wheezing.   Cardiovascular: Negative for chest pain and leg swelling.  Musculoskeletal: Negative for back  pain and gait problem.  Skin: Negative for rash.  All other systems reviewed and are negative.   Observations/Objective: Patient sounds comfortable and in no acute distress  Assessment and Plan: Problem List Items Addressed This Visit      Cardiovascular and Mediastinum   Hypertension associated with diabetes ([place])   Relevant Medications   sitaGLIPtin-metformin (JANUMET) 50-1000 MG tablet     Endocrine   Type II diabetes mellitus (HCC) - Primary (Chronic)   Relevant Medications   sitaGLIPtin-metformin (JANUMET) 50-1000 MG tablet   Hyperlipidemia associated with type 2 diabetes mellitus (HCC)   Relevant Medications   sitaGLIPtin-metformin (JANUMET) 50-1000 MG tablet       Follow Up Instructions: jardiance 60m samples and will cut in half  Follow up in 3 months   I discussed the assessment and treatment plan with the patient. The patient was provided an opportunity to ask questions and all were answered. The patient agreed with the plan and demonstrated an understanding of the instructions.   The patient was advised to call back or seek an in-person evaluation if the symptoms worsen or if the condition fails to improve as anticipated.  The above assessment and management plan was discussed with the patient. The patient verbalized understanding of and has agreed to the management plan. Patient is aware to call the clinic if symptoms persist or worsen. Patient is aware when to return to the clinic for a follow-up visit. Patient educated on when it is appropriate to go to the emergency department.    I provided 14 minutes of non-face-to-face time during this encounter.    JWorthy Rancher MD

## 2019-02-02 ENCOUNTER — Telehealth: Payer: Self-pay | Admitting: Family Medicine

## 2019-02-02 NOTE — Telephone Encounter (Signed)
No samples- hope aware.

## 2019-02-13 ENCOUNTER — Telehealth: Payer: Self-pay | Admitting: Family Medicine

## 2019-02-16 ENCOUNTER — Telehealth: Payer: Self-pay | Admitting: Family Medicine

## 2019-02-16 NOTE — Telephone Encounter (Signed)
Pt aware form has been faxed

## 2019-03-08 ENCOUNTER — Other Ambulatory Visit: Payer: Self-pay | Admitting: Family Medicine

## 2019-03-08 DIAGNOSIS — E785 Hyperlipidemia, unspecified: Secondary | ICD-10-CM

## 2019-03-08 DIAGNOSIS — E1169 Type 2 diabetes mellitus with other specified complication: Secondary | ICD-10-CM

## 2019-03-19 ENCOUNTER — Ambulatory Visit: Payer: Medicare Other | Admitting: Family Medicine

## 2019-03-20 ENCOUNTER — Encounter: Payer: Self-pay | Admitting: *Deleted

## 2019-04-13 IMAGING — DX DG KNEE 1-2V*L*
2 series · 2 of 2 positions shown · non-contrast
Comparison: None.

CLINICAL DATA: Left knee pain, no injury

EXAM:
LEFT KNEE - 1-2 VIEW

[knee lat]
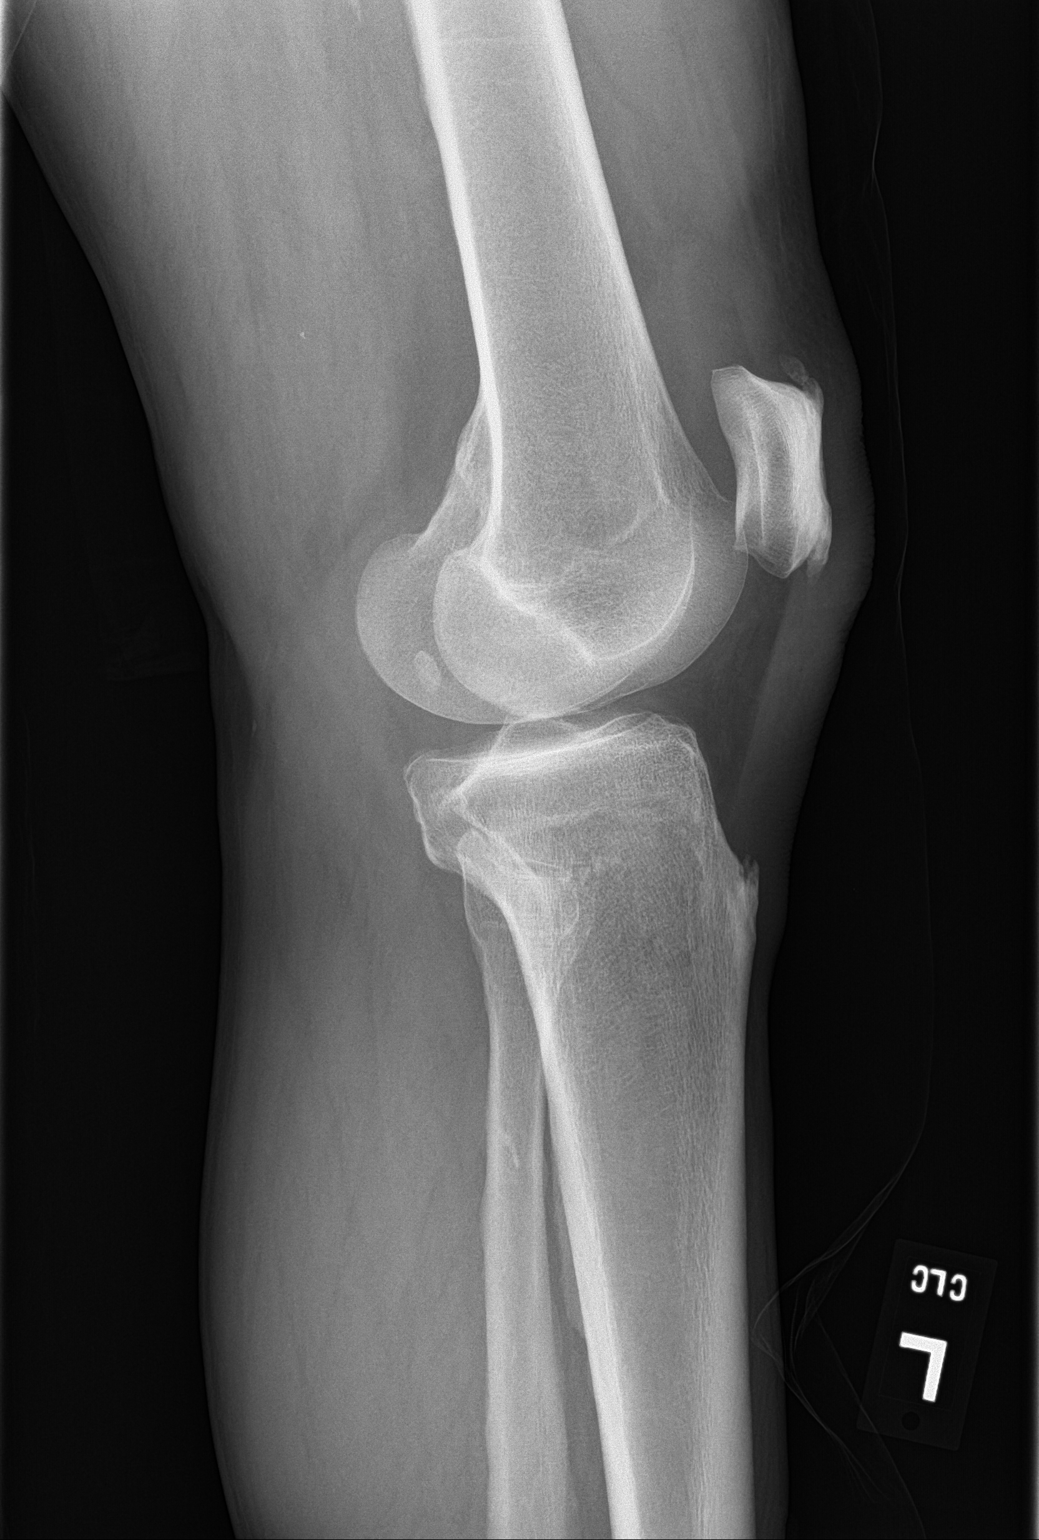

[knee ap]
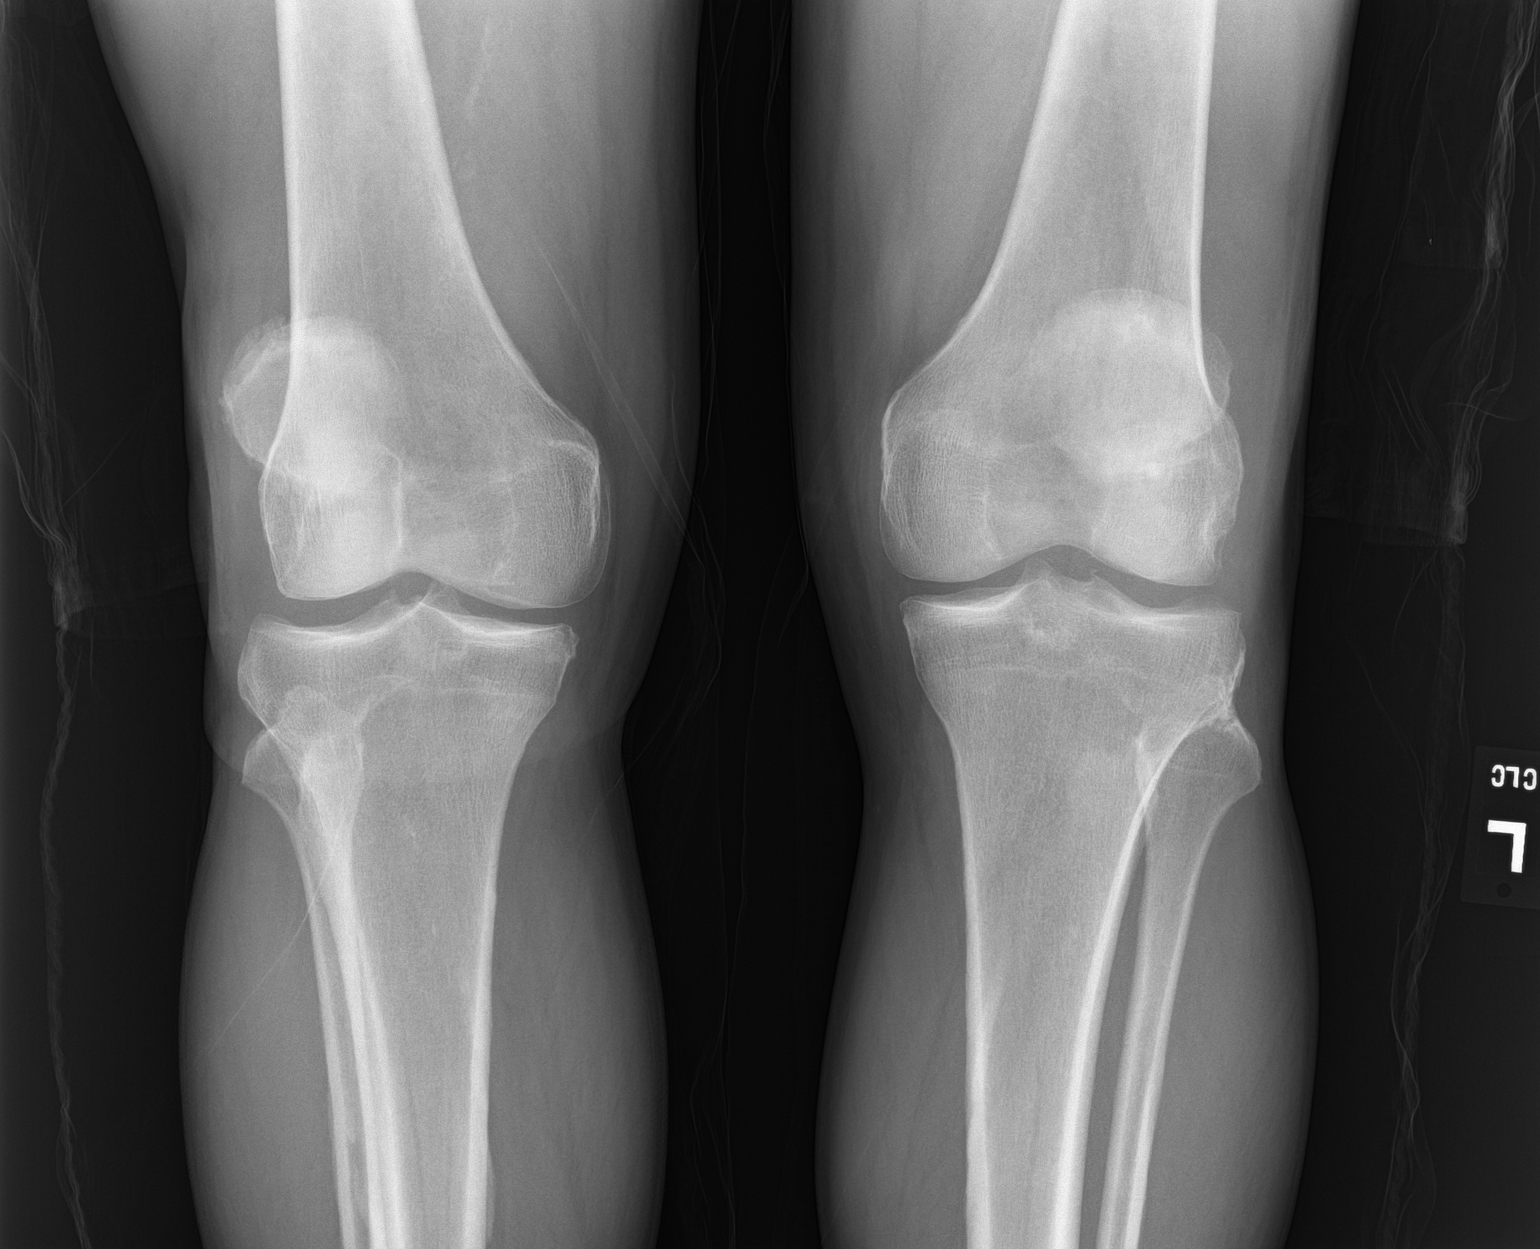

[2 of 2 positions shown; findings below may reference images not displayed]

FINDINGS: Standing frontal views the knees show very mild degenerative change
involving the medial compartments right more so than left with only
slight loss of medial joint space. On the lateral view the
patellofemoral articulation on the left is normal and no joint
effusion is seen.
IMPRESSION: Very mild loss of the medial compartment of the knees bilaterally
right slightly more than left.

## 2019-04-20 ENCOUNTER — Ambulatory Visit: Payer: Medicare Other | Admitting: *Deleted

## 2019-04-21 ENCOUNTER — Ambulatory Visit: Payer: Medicare Other

## 2019-04-28 ENCOUNTER — Other Ambulatory Visit: Payer: Self-pay

## 2019-04-29 ENCOUNTER — Ambulatory Visit (INDEPENDENT_AMBULATORY_CARE_PROVIDER_SITE_OTHER): Payer: Medicare Other | Admitting: Family Medicine

## 2019-04-29 ENCOUNTER — Encounter: Payer: Self-pay | Admitting: Family Medicine

## 2019-04-29 VITALS — BP 144/69 | HR 68 | Temp 98.9°F | Resp 18 | Ht 69.0 in | Wt 193.4 lb

## 2019-04-29 DIAGNOSIS — E1159 Type 2 diabetes mellitus with other circulatory complications: Secondary | ICD-10-CM | POA: Diagnosis not present

## 2019-04-29 DIAGNOSIS — E1169 Type 2 diabetes mellitus with other specified complication: Secondary | ICD-10-CM | POA: Diagnosis not present

## 2019-04-29 DIAGNOSIS — E785 Hyperlipidemia, unspecified: Secondary | ICD-10-CM | POA: Diagnosis not present

## 2019-04-29 DIAGNOSIS — Z23 Encounter for immunization: Secondary | ICD-10-CM

## 2019-04-29 DIAGNOSIS — I10 Essential (primary) hypertension: Secondary | ICD-10-CM

## 2019-04-29 DIAGNOSIS — I152 Hypertension secondary to endocrine disorders: Secondary | ICD-10-CM

## 2019-04-29 LAB — BAYER DCA HB A1C WAIVED: HB A1C (BAYER DCA - WAIVED): 7.8 % — ABNORMAL HIGH (ref <7.0)

## 2019-04-29 MED ORDER — LISINOPRIL-HYDROCHLOROTHIAZIDE 20-12.5 MG PO TABS: 2.0000 | ORAL_TABLET | Freq: Every day | ORAL | 3 refills | Status: DC

## 2019-04-29 MED ORDER — JANUMET 50-1000 MG PO TABS: 1.0000 | ORAL_TABLET | Freq: Two times a day (BID) | ORAL | 3 refills | Status: DC

## 2019-04-29 MED ORDER — JARDIANCE 10 MG PO TABS: 10.0000 mg | ORAL_TABLET | Freq: Every day | ORAL | 3 refills | Status: DC

## 2019-04-29 MED ORDER — FENOFIBRATE 160 MG PO TABS: 160.0000 mg | ORAL_TABLET | Freq: Every day | ORAL | 3 refills | Status: DC

## 2019-04-29 MED ORDER — ROSUVASTATIN CALCIUM 40 MG PO TABS: 40.0000 mg | ORAL_TABLET | Freq: Every day | ORAL | 3 refills | Status: DC

## 2019-04-29 MED ORDER — GLIPIZIDE ER 10 MG PO TB24: 10.0000 mg | ORAL_TABLET | Freq: Every day | ORAL | 3 refills | Status: DC

## 2019-04-29 NOTE — Progress Notes (Signed)
BP (!) 144/69   Pulse 68   Temp 98.9 F (37.2 C) (Temporal)   Resp 18   Ht _0  (1.753 m)   Wt 193 lb 6.4 oz (87.7 kg)   SpO2 98%   BMI 28.56 kg/m    Subjective:   Patient ID: Philip Huang, male    DOB: October 18, 1950, 68 y.o.   MRN: [id number]  HPI: Philip Huang is a 68 y.o. male presenting on 04/29/2019 for Diabetes (3 month follow up)   HPI Type 2 diabetes mellitus Patient comes in today for recheck of his diabetes. Patient has been currently taking Jardiance and glipizide and Janumet A1c is 7.8 today but he did say he was out of his Jardiance for most of the past 3 months and just got it just recently back and his blood sugars are running a lot better since he has had it back.. Patient is currently on an ACE inhibitor/ARB. Patient has not seen an ophthalmologist this year. Patient denies any issues with their feet.   Hypertension Patient is currently on lisinopril hydrochlorothiazide, and their blood pressure today is 144/69. Patient denies any lightheadedness or dizziness. Patient denies headaches, blurred vision, chest pains, shortness of breath, or weakness. Denies any side effects from medication and is content with current medication.   Hyperlipidemia Patient is coming in for recheck of his hyperlipidemia. The patient is currently taking fish oil and fenofibrate and Crestor. They deny any issues with myalgias or history of liver damage from it. They deny any focal numbness or weakness or chest pain.   Relevant past medical, surgical, family and social history reviewed and updated as indicated. Interim medical history since our last visit reviewed. Allergies and medications reviewed and updated.  Review of Systems  Constitutional: Negative for chills and fever.  Eyes: Negative for visual disturbance.  Respiratory: Negative for shortness of breath and wheezing.   Cardiovascular: Negative for chest pain and leg swelling.  Musculoskeletal: Negative for  back pain and gait problem.  Skin: Negative for rash.  Neurological: Negative for dizziness, weakness and light-headedness.  All other systems reviewed and are negative.   Per HPI unless specifically indicated above   Allergies as of 04/29/2019      Reactions   Doxycycline    Sulfa Antibiotics Rash      Medication List       Accurate as of April 29, 2019  1:44 PM. If you have any questions, ask your nurse or doctor.        STOP taking these medications   oseltamivir 75 MG capsule Commonly known as: Tamiflu Stopped by: Fransisca Kaufmann Malee Grays, MD     TAKE these medications   aspirin 81 MG tablet Take 325 mg by mouth daily.   blood glucose meter kit and supplies Kit Accucheck Meter. Check BS BID and prn Dx Code E11.9   clotrimazole 1 % cream Commonly known as: Lotrimin AF Apply 1 application topically 2 (two) times daily.   fenofibrate 160 MG tablet Take 1 tablet (160 mg total) by mouth daily.   fish oil-omega-3 fatty acids 1000 MG capsule Take 1 g by mouth daily.   glipiZIDE 10 MG 24 hr tablet Commonly known as: GLUCOTROL XL Take 1 tablet (10 mg total) by mouth daily with breakfast.   glucose blood test strip Commonly known as: Accu-Chek Aviva Plus USE TO CHECK BLOOD SUGAR TWICE DAILY   Janumet 50-1000 MG tablet Generic drug: sitaGLIPtin-metformin Take 1 tablet by mouth 2 (two)  times daily with a meal.   Jardiance 10 MG Tabs tablet Generic drug: empagliflozin Take 10 mg by mouth daily. Samples   LifeScan Unistik 2 Misc 1 strip by Does not apply route daily. Test 1X per day-- dx 250.02   lisinopril-hydrochlorothiazide 20-12.5 MG tablet Commonly known as: ZESTORETIC Take 2 tablets by mouth daily.   meloxicam 15 MG tablet Commonly known as: MOBIC Take 1 tablet (15 mg total) by mouth daily. For joint and muscle pain   MENS 50+ ADVANCED PO Take by mouth.   nitroGLYCERIN 0.4 MG SL tablet Commonly known as: NITROSTAT Place 1 tablet (0.4 mg total)  under the tongue every 5 (five) minutes as needed.   rosuvastatin 40 MG tablet Commonly known as: CRESTOR Take 1 tablet (40 mg total) by mouth daily.   Salicylic Acid 50 % Soln by Does not apply route.   triamcinolone cream 0.1 % Commonly known as: KENALOG Apply 1 application topically 2 (two) times daily.   vitamin C 500 MG tablet Commonly known as: ASCORBIC ACID Take 500 mg by mouth daily.   Vitamin D 50 MCG (2000 UT) tablet Take 2,000 Units by mouth daily.        Objective:   BP (!) 144/69   Pulse 68   Temp 98.9 F (37.2 C) (Temporal)   Resp 18   Ht _0  (1.753 m)   Wt 193 lb 6.4 oz (87.7 kg)   SpO2 98%   BMI 28.56 kg/m   Wt Readings from Last 3 Encounters:  04/29/19 193 lb 6.4 oz (87.7 kg)  09/22/18 195 lb 3.2 oz (88.5 kg)  07/25/18 196 lb (88.9 kg)    Physical Exam Vitals signs and nursing note reviewed.  Constitutional:      General: He is not in acute distress.    Appearance: He is well-developed. He is not diaphoretic.  Eyes:     General: No scleral icterus.    Conjunctiva/sclera: Conjunctivae normal.  Neck:     Musculoskeletal: Neck supple.     Thyroid: No thyromegaly.  Cardiovascular:     Rate and Rhythm: Normal rate and regular rhythm.     Heart sounds: Normal heart sounds. No murmur.  Pulmonary:     Effort: Pulmonary effort is normal. No respiratory distress.     Breath sounds: Normal breath sounds. No wheezing.  Musculoskeletal: Normal range of motion.  Lymphadenopathy:     Cervical: No cervical adenopathy.  Skin:    General: Skin is warm and dry.     Findings: No rash.  Neurological:     Mental Status: He is alert and oriented to person, place, and time.     Coordination: Coordination normal.  Psychiatric:        Behavior: Behavior normal.       Assessment & Plan:   Problem List Items Addressed This Visit      Cardiovascular and Mediastinum   Hypertension associated with diabetes ([place])   Relevant Medications   fenofibrate  160 MG tablet   rosuvastatin (CRESTOR) 40 MG tablet   lisinopril-hydrochlorothiazide (ZESTORETIC) 20-12.5 MG tablet   glipiZIDE (GLUCOTROL XL) 10 MG 24 hr tablet   sitaGLIPtin-metformin (JANUMET) 50-1000 MG tablet   empagliflozin (JARDIANCE) 10 MG TABS tablet   Other Relevant Orders   CBC with Differential/Platelet   CMP14+EGFR     Endocrine   Type II diabetes mellitus (HCC) - Primary (Chronic)   Relevant Medications   rosuvastatin (CRESTOR) 40 MG tablet   lisinopril-hydrochlorothiazide (ZESTORETIC) 20-12.5 MG  tablet   glipiZIDE (GLUCOTROL XL) 10 MG 24 hr tablet   sitaGLIPtin-metformin (JANUMET) 50-1000 MG tablet   empagliflozin (JARDIANCE) 10 MG TABS tablet   Other Relevant Orders   Bayer DCA Hb A1c Waived   Hyperlipidemia associated with type 2 diabetes mellitus (HCC)   Relevant Medications   fenofibrate 160 MG tablet   rosuvastatin (CRESTOR) 40 MG tablet   lisinopril-hydrochlorothiazide (ZESTORETIC) 20-12.5 MG tablet   glipiZIDE (GLUCOTROL XL) 10 MG 24 hr tablet   sitaGLIPtin-metformin (JANUMET) 50-1000 MG tablet   empagliflozin (JARDIANCE) 10 MG TABS tablet   Other Relevant Orders   Lipid panel    Other Visit Diagnoses    Need for immunization against influenza       Relevant Orders   Flu Vaccine QUAD High Dose(Fluad) (Completed)    Continue lisinopril hydrochlorothiazide and glipizide and Janumet and Jardiance and Crestor and fenofibrate, no changes in medication, he is restarting his Jardiance I will see how his blood sugars do from there.  Follow up plan: Return in about 3 months (around 07/29/2019), or if symptoms worsen or fail to improve, for Diabetes and hypertension.  Counseling provided for all of the vaccine components Orders Placed This Encounter  Procedures  . Flu Vaccine QUAD High Dose(Fluad)  . CBC with Differential/Platelet  . CMP14+EGFR  . Lipid panel  . Bayer [facility] Hb A1c Livingston Wheeler, MD [facility]  04/29/2019, 1:44 PM

## 2019-04-30 LAB — CBC WITH DIFFERENTIAL/PLATELET
Basophils Absolute: 0.1 10*3/uL (ref 0.0–0.2)
Basos: 1 %
EOS (ABSOLUTE): 0.4 10*3/uL (ref 0.0–0.4)
Eos: 6 %
Hematocrit: 42.7 % (ref 37.5–51.0)
Hemoglobin: 14.6 g/dL (ref 13.0–17.7)
Immature Grans (Abs): 0 10*3/uL (ref 0.0–0.1)
Immature Granulocytes: 0 %
Lymphocytes Absolute: 2.2 10*3/uL (ref 0.7–3.1)
Lymphs: 33 %
MCH: 30.2 pg (ref 26.6–33.0)
MCHC: 34.2 g/dL (ref 31.5–35.7)
MCV: 88 fL (ref 79–97)
Monocytes Absolute: 0.6 10*3/uL (ref 0.1–0.9)
Monocytes: 8 %
Neutrophils Absolute: 3.5 10*3/uL (ref 1.4–7.0)
Neutrophils: 52 %
Platelets: 229 10*3/uL (ref 150–450)
RBC: 4.84 x10E6/uL (ref 4.14–5.80)
RDW: 13.6 % (ref 11.6–15.4)
WBC: 6.8 10*3/uL (ref 3.4–10.8)

## 2019-04-30 LAB — LIPID PANEL
Chol/HDL Ratio: 4.5 ratio (ref 0.0–5.0)
Cholesterol, Total: 180 mg/dL (ref 100–199)
HDL: 40 mg/dL (ref >39)
LDL Chol Calc (NIH): 110 mg/dL — ABNORMAL HIGH (ref 0–99)
Triglycerides: 168 mg/dL — ABNORMAL HIGH (ref 0–149)
VLDL Cholesterol Cal: 30 mg/dL (ref 5–40)

## 2019-04-30 LAB — CMP14+EGFR
ALT: 30 IU/L (ref 0–44)
AST: 26 IU/L (ref 0–40)
Albumin/Globulin Ratio: 1.7 (ref 1.2–2.2)
Albumin: 4.5 g/dL (ref 3.8–4.8)
Alkaline Phosphatase: 47 IU/L (ref 39–117)
BUN/Creatinine Ratio: 16 (ref 10–24)
BUN: 19 mg/dL (ref 8–27)
Bilirubin Total: 0.2 mg/dL (ref 0.0–1.2)
CO2: 24 mmol/L (ref 20–29)
Calcium: 9 mg/dL (ref 8.6–10.2)
Chloride: 102 mmol/L (ref 96–106)
Creatinine, Ser: 1.2 mg/dL (ref 0.76–1.27)
GFR calc Af Amer: 72 mL/min/{1.73_m2} (ref >59)
GFR calc non Af Amer: 63 mL/min/{1.73_m2} (ref >59)
Globulin, Total: 2.7 g/dL (ref 1.5–4.5)
Glucose: 145 mg/dL — ABNORMAL HIGH (ref 65–99)
Potassium: 4.1 mmol/L (ref 3.5–5.2)
Sodium: 140 mmol/L (ref 134–144)
Total Protein: 7.2 g/dL (ref 6.0–8.5)

## 2019-05-05 ENCOUNTER — Ambulatory Visit (INDEPENDENT_AMBULATORY_CARE_PROVIDER_SITE_OTHER): Payer: Medicare Other | Admitting: *Deleted

## 2019-05-05 DIAGNOSIS — Z0000 Encounter for general adult medical examination without abnormal findings: Secondary | ICD-10-CM

## 2019-05-05 NOTE — Patient Instructions (Signed)
Preventive Care 67 Years and Older, Male Preventive care refers to lifestyle choices and visits with your health care provider that can promote health and wellness. This includes:  A yearly physical exam. This is also called an annual well check.  Regular dental and eye exams.  Immunizations.  Screening for certain conditions.  Healthy lifestyle choices, such as diet and exercise. What can I expect for my preventive care visit? Physical exam Your health care provider will check:  Height and weight. These may be used to calculate body mass index (BMI), which is a measurement that tells if you are at a healthy weight.  Heart rate and blood pressure.  Your skin for abnormal spots. Counseling Your health care provider may ask you questions about:  Alcohol, tobacco, and drug use.  Emotional well-being.  Home and relationship well-being.  Sexual activity.  Eating habits.  History of falls.  Memory and ability to understand (cognition).  Work and work Statistician. What immunizations do I need?  Influenza (flu) vaccine  This is recommended every year. Tetanus, diphtheria, and pertussis (Tdap) vaccine  You may need a Td booster every 10 years. Varicella (chickenpox) vaccine  You may need this vaccine if you have not already been vaccinated. Zoster (shingles) vaccine  You may need this after age 50. Pneumococcal conjugate (PCV13) vaccine  One dose is recommended after age 24. Pneumococcal polysaccharide (PPSV23) vaccine  One dose is recommended after age 33. Measles, mumps, and rubella (MMR) vaccine  You may need at least one dose of MMR if you were born in 1957 or later. You may also need a second dose. Meningococcal conjugate (MenACWY) vaccine  You may need this if you have certain conditions. Hepatitis A vaccine  You may need this if you have certain conditions or if you travel or work in places where you may be exposed to hepatitis A. Hepatitis B vaccine   You may need this if you have certain conditions or if you travel or work in places where you may be exposed to hepatitis B. Haemophilus influenzae type b (Hib) vaccine  You may need this if you have certain conditions. You may receive vaccines as individual doses or as more than one vaccine together in one shot (combination vaccines). Talk with your health care provider about the risks and benefits of combination vaccines. What tests do I need? Blood tests  Lipid and cholesterol levels. These may be checked every 5 years, or more frequently depending on your overall health.  Hepatitis C test.  Hepatitis B test. Screening  Lung cancer screening. You may have this screening every year starting at age 74 if you have a 30-pack-year history of smoking and currently smoke or have quit within the past 15 years.  Colorectal cancer screening. All adults should have this screening starting at age 57 and continuing until age 54. Your health care provider may recommend screening at age 47 if you are at increased risk. You will have tests every 1-10 years, depending on your results and the type of screening test.  Prostate cancer screening. Recommendations will vary depending on your family history and other risks.  Diabetes screening. This is done by checking your blood sugar (glucose) after you have not eaten for a while (fasting). You may have this done every 1-3 years.  Abdominal aortic aneurysm (AAA) screening. You may need this if you are a current or former smoker.  Sexually transmitted disease (STD) testing. Follow these instructions at home: Eating and drinking  Eat  a diet that includes fresh fruits and vegetables, whole grains, lean protein, and low-fat dairy products. Limit your intake of foods with high amounts of sugar, saturated fats, and salt.  Take vitamin and mineral supplements as recommended by your health care provider.  Do not drink alcohol if your health care provider  tells you not to drink.  If you drink alcohol: ? Limit how much you have to 0-2 drinks a day. ? Be aware of how much alcohol is in your drink. In the [place] one drink equals one 12 oz bottle of beer (355 mL), one 5 oz glass of wine (148 mL), or one 1 oz glass of hard liquor (44 mL). Lifestyle  Take daily care of your teeth and gums.  Stay active. Exercise for at least 30 minutes on 5 or more days each week.  Do not use any products that contain nicotine or tobacco, such as cigarettes, e-cigarettes, and chewing tobacco. If you need help quitting, ask your health care provider.  If you are sexually active, practice safe sex. Use a condom or other form of protection to prevent STIs (sexually transmitted infections).  Talk with your health care provider about taking a low-dose aspirin or statin. What's next?  Visit your health care provider once a year for a well check visit.  Ask your health care provider how often you should have your eyes and teeth checked.  Stay up to date on all vaccines. This information is not intended to replace advice given to you by your health care provider. Make sure you discuss any questions you have with your health care provider. Document Released: 08/19/2015 Document Revised: 07/17/2018 Document Reviewed: 07/17/2018 Elsevier Patient Education  2020 [employer]  

## 2019-05-05 NOTE — Progress Notes (Signed)
MEDICARE ANNUAL WELLNESS VISIT  05/05/2019  Telephone Visit Disclaimer This Medicare AWV was conducted by telephone due to national recommendations for restrictions regarding the COVID-19 Pandemic (e.g. social distancing).  I verified, using two identifiers, that I am speaking with Philip Huang or their authorized healthcare agent. I discussed the limitations, risks, security, and privacy concerns of performing an evaluation and management service by telephone and the potential availability of an in-person appointment in the future. The patient expressed understanding and agreed to proceed.   Subjective:  Philip Huang is a 68 y.o. male patient of [name], Philip Kaufmann, MD who had a Medicare Annual Wellness Visit today via telephone. Philip Huang is Working part time and lives with their spouse. he has 4 children. he reports that he is socially active and does interact with friends/family regularly. he is moderately physically active and enjoys hunting.  Patient Care Team: [name], Philip Kaufmann, MD as PCP - General (Family Medicine)  Advanced Directives 05/05/2019 04/11/2018  Does Patient Have a Medical Advance Directive? Yes Yes  Type of Advance Directive Healthcare Power of [place]  Does patient want to make changes to medical advance directive? No - Patient declined -  Copy of [place] in Chart? No - copy requested No - copy requested    Hospital Utilization Over the Past 12 Months: # of hospitalizations or ER visits: 0 # of surgeries: 0  Review of Systems    Patient reports that his overall health is unchanged compared to last year.  History obtained from chart review  Patient Reported Readings (BP, Pulse, CBG, Weight, etc) none  Pain Assessment Pain : No/denies pain     Current Medications & Allergies (verified) Allergies as of 05/05/2019      Reactions   Doxycycline    Sulfa Antibiotics Rash       Medication List       Accurate as of May 05, 2019  1:50 PM. If you have any questions, ask your nurse or doctor.        STOP taking these medications   clotrimazole 1 % cream Commonly known as: Lotrimin AF   meloxicam 15 MG tablet Commonly known as: MOBIC   Salicylic Acid 50 % Soln   triamcinolone cream 0.1 % Commonly known as: KENALOG     TAKE these medications   aspirin 81 MG tablet Take 325 mg by mouth daily.   blood glucose meter kit and supplies Kit Accucheck Meter. Check BS BID and prn Dx Code E11.9   fenofibrate 160 MG tablet Take 1 tablet (160 mg total) by mouth daily.   fish oil-omega-3 fatty acids 1000 MG capsule Take 1 g by mouth daily.   glipiZIDE 10 MG 24 hr tablet Commonly known as: GLUCOTROL XL Take 1 tablet (10 mg total) by mouth daily with breakfast.   glucose blood test strip Commonly known as: Accu-Chek Aviva Plus USE TO CHECK BLOOD SUGAR TWICE DAILY   Janumet 50-1000 MG tablet Generic drug: sitaGLIPtin-metformin Take 1 tablet by mouth 2 (two) times daily with a meal.   Jardiance 10 MG Tabs tablet Generic drug: empagliflozin Take 10 mg by mouth daily. Samples   LifeScan Unistik 2 Misc 1 strip by Does not apply route daily. Test 1X per day-- dx 250.02   lisinopril-hydrochlorothiazide 20-12.5 MG tablet Commonly known as: ZESTORETIC Take 2 tablets by mouth daily.   MENS 50+ ADVANCED PO Take by mouth.   nitroGLYCERIN 0.4 MG SL  tablet Commonly known as: NITROSTAT Place 1 tablet (0.4 mg total) under the tongue every 5 (five) minutes as needed.   rosuvastatin 40 MG tablet Commonly known as: CRESTOR Take 1 tablet (40 mg total) by mouth daily.   vitamin C 500 MG tablet Commonly known as: ASCORBIC ACID Take 500 mg by mouth daily.   Vitamin D 50 MCG (2000 UT) tablet Take 2,000 Units by mouth daily.       History (reviewed): Past Medical History:  Diagnosis Date  . Diabetes mellitus   . HTN (hypertension)   .  Hyperlipidemia   . Overweight(278.02)   . Squamous acanthoma of upper extremity    Past Surgical History:  Procedure Laterality Date  . None    . squamous acanthoma of upper extremity     Family History  Problem Relation Age of Onset  . Diabetes Mother   . Heart disease Mother 52       CABG  . Hyperlipidemia Mother   . Hypertension Mother   . Parkinsonism Father   . Diabetes Sister   . Hyperlipidemia Sister   . Hypertension Sister   . Diabetes Brother   . Hyperlipidemia Brother   . Hypertension Brother    Social History   Socioeconomic History  . Marital status: Married    Spouse name: Philip Huang  . Number of children: 2  . Years of education: 94  . Highest education level: High school graduate  Occupational History    Employer: [employer]    Comment: works part time  Social Needs  . Financial resource strain: Not hard at all  . Food insecurity    Worry: Never true    Inability: Never true  . Transportation needs    Medical: No    Non-medical: No  Tobacco Use  . Smoking status: Former Smoker    Types: Cigarettes    Quit date: 08/06/1962    Years since quitting: 56.7  . Smokeless tobacco: Never Used  Substance and Sexual Activity  . Alcohol use: No  . Drug use: No  . Sexual activity: Yes  Lifestyle  . Physical activity    Days per week: 3 days    Minutes per session: 30 min  . Stress: Not at all  Relationships  . Social connections    Talks on phone: More than three times a week    Gets together: More than three times a week    Attends religious service: Never    Active member of club or organization: No    Attends meetings of clubs or organizations: Never    Relationship status: Married  Other Topics Concern  . Not on file  Social History Narrative  . Not on file    Activities of Daily Living In your present state of health, do you have any difficulty performing the following activities: 05/05/2019  Hearing? 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 N  Comment pt gets yearly eye exams-has appointment next month  Difficulty concentrating or making decisions? N  Walking or climbing stairs? N  Dressing or bathing? N  Doing errands, shopping? N  Preparing Food and eating ? N  Using the Toilet? N  In the past six months, have you accidently leaked urine? N  Do you have problems with loss of bowel control? N  Managing your Medications? N  Managing your Finances? N  Housekeeping or managing your Housekeeping? N  Some recent data might be hidden    Patient Education/ Literacy How often do you need to have someone help you when you read instructions, pamphlets, or other written materials from your doctor or pharmacy?: 1 - Never What is the last grade level you completed in school?: 12th grade  Exercise Current Exercise Habits: Home exercise routine, Type of exercise: walking, Time (Minutes): 30, Frequency (Times/Week): 3, Weekly Exercise (Minutes/Week): 90, Intensity: Mild, Exercise limited by: None identified  Diet Patient reports consuming 3 meals a day and 1 snack(s) a day Patient reports that his primary diet is: Regular Patient reports that she does have regular access to food.   Depression Screen PHQ 2/9 Scores 05/05/2019 04/29/2019 09/22/2018 07/25/2018 04/11/2018 03/05/2018 11/27/2017  PHQ - 2 Score 0 0 0 0 0 0 0     Fall Risk Fall Risk  05/05/2019 04/29/2019 07/25/2018 04/11/2018 03/05/2018  Falls in the past year? 0 0 0 No No  Number falls in past yr: 0 - - - -  Injury with Fall? 0 - - - -  Follow up Falls prevention discussed - - - -  Comment Get rid of all throw rugs in the house, adequate lighting in the walkways and grab bars in the bathroom - - - -     Objective:  Philip Huang seemed alert and oriented and he participated appropriately during our telephone visit.  Blood Pressure Weight BMI  BP Readings from Last 3 Encounters:  04/29/19 (!) 144/69  09/22/18 135/72   07/25/18 139/67   Wt Readings from Last 3 Encounters:  04/29/19 193 lb 6.4 oz (87.7 kg)  09/22/18 195 lb 3.2 oz (88.5 kg)  07/25/18 196 lb (88.9 kg)   BMI Readings from Last 1 Encounters:  04/29/19 28.56 kg/m    *Unable to obtain current vital signs, weight, and BMI due to telephone visit type  Hearing/Vision  . Trevonte did not seem to have difficulty with hearing/understanding during the telephone conversation . Reports that he has had a formal eye exam by an eye care professional within the past year . Reports that he has not had a formal hearing evaluation within the past year *Unable to fully assess hearing and vision during telephone visit type  Cognitive Function: 6CIT Screen 05/05/2019  What Year? 0 points  What month? 0 points  What time? 0 points  Count back from 20 0 points  Months in reverse 4 points  Repeat phrase 0 points  Total Score 4   (Normal:0-7, Significant for Dysfunction: >8)  Normal Cognitive Function Screening: Yes   Immunization & Health Maintenance Record Immunization History  Administered Date(s) Administered  . Fluad Quad(high Dose 65+) 04/29/2019  . Influenza, High Dose Seasonal PF 04/27/2018  . Influenza,inj,Quad PF,6+ Mos 04/30/2015, 04/20/2017  . Influenza-Unspecified 05/20/2016  . Pneumococcal Conjugate-13 08/26/2017, 08/26/2017  . Pneumococcal Polysaccharide-23 05/06/2001, 05/06/2001  . Td 08/06/2005, 06/29/2013  . Zoster 10/28/2013    Health Maintenance  Topic Date Due  . OPHTHALMOLOGY EXAM  10/10/2018  . PNA vac Low Risk Adult (2 of 2 - PPSV23) 04/28/2020 (Originally 08/26/2018)  . HEMOGLOBIN A1C  07/29/2019  . FOOT EXAM  04/28/2020  . TETANUS/TDAP  06/30/2023  . COLONOSCOPY  05/17/2025  . INFLUENZA VACCINE  Completed  . Hepatitis C Screening  Completed  . COLON CANCER SCREENING ANNUAL FOBT  Discontinued       Assessment  This is a routine wellness examination for Badr Piedra.  Health Maintenance: Due or [place]  Maintenance Due  Topic Date Due  . OPHTHALMOLOGY EXAM  10/10/2018    Philip Huang does not need a referral for Community Assistance: Care Management:   no Social Work:    no Prescription Assistance:  no Nutrition/Diabetes Education:  no   Plan:  Personalized Goals Goals Addressed            This Visit's Progress   . DIET - INCREASE WATER INTAKE       Try to drink 6-8 glasses of water daily.      Personalized Health Maintenance & Screening Recommendations  Diabetic Eye Exam which he has scheduled for next month  Lung Cancer Screening Recommended: no (Low Dose CT Chest recommended if Age 2-80 years, 30 pack-year currently smoking OR have quit w/in past 15 years) Hepatitis C Screening recommended: no HIV Screening recommended: no  Advanced Directives: Written information was not prepared per patient's request.  Referrals & Orders No orders of the defined types were placed in this encounter.   Follow-up Plan . Follow-up with [name], Philip Kaufmann, MD as planned . Keep your appointment for your Diabetic Eye Exam as planned . Bring a copy of your Advanced Directive in for our records   I have personally reviewed and noted the following in the patient's chart:   . Medical and social history . Use of alcohol, tobacco or illicit drugs  . Current medications and supplements . Functional ability and status . Nutritional status . Physical activity . Advanced directives . List of other physicians . Hospitalizations, surgeries, and ER visits in previous 12 months . Vitals . Screenings to include cognitive, depression, and falls . Referrals and appointments  In addition, I have reviewed and discussed with Philip Huang certain preventive protocols, quality metrics, and best practice recommendations. A written personalized care plan for preventive services as well as general preventive health recommendations is available and can be mailed to the  patient at his request.      Milas Hock, LPN  [phone]

## 2019-05-26 LAB — HM DIABETES EYE EXAM

## 2019-07-24 ENCOUNTER — Ambulatory Visit: Payer: Medicare Other | Admitting: Family Medicine

## 2019-07-27 ENCOUNTER — Telehealth: Payer: Self-pay | Admitting: Family Medicine

## 2019-07-27 DIAGNOSIS — E1169 Type 2 diabetes mellitus with other specified complication: Secondary | ICD-10-CM

## 2019-07-27 DIAGNOSIS — E785 Hyperlipidemia, unspecified: Secondary | ICD-10-CM

## 2019-07-27 DIAGNOSIS — E1159 Type 2 diabetes mellitus with other circulatory complications: Secondary | ICD-10-CM

## 2019-07-27 NOTE — Telephone Encounter (Signed)
Lab placed- wife aware

## 2019-07-28 ENCOUNTER — Other Ambulatory Visit: Payer: Medicare Other

## 2019-07-28 ENCOUNTER — Other Ambulatory Visit: Payer: Self-pay

## 2019-07-28 DIAGNOSIS — E785 Hyperlipidemia, unspecified: Secondary | ICD-10-CM

## 2019-07-28 DIAGNOSIS — E1169 Type 2 diabetes mellitus with other specified complication: Secondary | ICD-10-CM

## 2019-07-28 DIAGNOSIS — E1159 Type 2 diabetes mellitus with other circulatory complications: Secondary | ICD-10-CM

## 2019-07-28 DIAGNOSIS — I152 Hypertension secondary to endocrine disorders: Secondary | ICD-10-CM

## 2019-07-28 LAB — BAYER DCA HB A1C WAIVED: HB A1C (BAYER DCA - WAIVED): 7.9 % — ABNORMAL HIGH (ref <7.0)

## 2019-07-29 ENCOUNTER — Encounter: Payer: Self-pay | Admitting: Family Medicine

## 2019-07-29 ENCOUNTER — Ambulatory Visit (INDEPENDENT_AMBULATORY_CARE_PROVIDER_SITE_OTHER): Payer: Medicare Other | Admitting: Family Medicine

## 2019-07-29 VITALS — BP 137/69 | HR 70 | Temp 98.7°F | Ht 69.0 in | Wt 196.0 lb

## 2019-07-29 DIAGNOSIS — E1169 Type 2 diabetes mellitus with other specified complication: Secondary | ICD-10-CM | POA: Diagnosis not present

## 2019-07-29 DIAGNOSIS — I10 Essential (primary) hypertension: Secondary | ICD-10-CM | POA: Diagnosis not present

## 2019-07-29 DIAGNOSIS — I152 Hypertension secondary to endocrine disorders: Secondary | ICD-10-CM

## 2019-07-29 DIAGNOSIS — E1159 Type 2 diabetes mellitus with other circulatory complications: Secondary | ICD-10-CM | POA: Diagnosis not present

## 2019-07-29 DIAGNOSIS — E785 Hyperlipidemia, unspecified: Secondary | ICD-10-CM

## 2019-07-29 LAB — LIPID PANEL
Chol/HDL Ratio: 5.1 ratio — ABNORMAL HIGH (ref 0.0–5.0)
Cholesterol, Total: 194 mg/dL (ref 100–199)
HDL: 38 mg/dL — ABNORMAL LOW (ref >39)
LDL Chol Calc (NIH): 121 mg/dL — ABNORMAL HIGH (ref 0–99)
Triglycerides: 197 mg/dL — ABNORMAL HIGH (ref 0–149)
VLDL Cholesterol Cal: 35 mg/dL (ref 5–40)

## 2019-07-29 LAB — CMP14+EGFR
ALT: 32 IU/L (ref 0–44)
AST: 29 IU/L (ref 0–40)
Albumin/Globulin Ratio: 1.6 (ref 1.2–2.2)
Albumin: 4.4 g/dL (ref 3.8–4.8)
Alkaline Phosphatase: 46 IU/L (ref 39–117)
BUN/Creatinine Ratio: 21 (ref 10–24)
BUN: 18 mg/dL (ref 8–27)
Bilirubin Total: 0.3 mg/dL (ref 0.0–1.2)
CO2: 24 mmol/L (ref 20–29)
Calcium: 9.7 mg/dL (ref 8.6–10.2)
Chloride: 99 mmol/L (ref 96–106)
Creatinine, Ser: 0.87 mg/dL (ref 0.76–1.27)
GFR calc Af Amer: 103 mL/min/{1.73_m2} (ref >59)
GFR calc non Af Amer: 89 mL/min/{1.73_m2} (ref >59)
Globulin, Total: 2.8 g/dL (ref 1.5–4.5)
Glucose: 123 mg/dL — ABNORMAL HIGH (ref 65–99)
Potassium: 3.8 mmol/L (ref 3.5–5.2)
Sodium: 140 mmol/L (ref 134–144)
Total Protein: 7.2 g/dL (ref 6.0–8.5)

## 2019-07-29 LAB — CBC WITH DIFFERENTIAL/PLATELET
Basophils Absolute: 0.1 10*3/uL (ref 0.0–0.2)
Basos: 1 %
EOS (ABSOLUTE): 0.5 10*3/uL — ABNORMAL HIGH (ref 0.0–0.4)
Eos: 6 %
Hematocrit: 44.7 % (ref 37.5–51.0)
Hemoglobin: 15.3 g/dL (ref 13.0–17.7)
Immature Grans (Abs): 0 10*3/uL (ref 0.0–0.1)
Immature Granulocytes: 0 %
Lymphocytes Absolute: 2.4 10*3/uL (ref 0.7–3.1)
Lymphs: 32 %
MCH: 29.9 pg (ref 26.6–33.0)
MCHC: 34.2 g/dL (ref 31.5–35.7)
MCV: 88 fL (ref 79–97)
Monocytes Absolute: 0.6 10*3/uL (ref 0.1–0.9)
Monocytes: 8 %
Neutrophils Absolute: 4 10*3/uL (ref 1.4–7.0)
Neutrophils: 53 %
Platelets: 239 10*3/uL (ref 150–450)
RBC: 5.11 x10E6/uL (ref 4.14–5.80)
RDW: 13 % (ref 11.6–15.4)
WBC: 7.5 10*3/uL (ref 3.4–10.8)

## 2019-07-29 MED ORDER — JARDIANCE 25 MG PO TABS: 25.0000 mg | ORAL_TABLET | Freq: Every day | ORAL | 3 refills | Status: DC

## 2019-07-29 NOTE — Progress Notes (Signed)
BP 137/69   Pulse 70   Temp 98.7 F (37.1 C) (Temporal)   Ht 5' 9"  (1.753 m)   Wt 196 lb (88.9 kg)   SpO2 96%   BMI 28.94 kg/m    Subjective:   Patient ID: Philip Huang, male    DOB: 04-25-1951, 68 y.o.   MRN: [id number]  HPI: Philip Huang is a 68 y.o. male presenting on 07/29/2019 for Diabetes (3 month follow up)   HPI Type 2 diabetes mellitus Patient comes in today for recheck of his diabetes. Patient has been currently taking Jardiance and glipizide and Janumet. Patient is currently on an ACE inhibitor/ARB. Patient has seen an ophthalmologist this year. Patient denies any issues with their feet.  His A1c is up just slightly at 7.9, we will double the Jardiance  Hypertension Patient is currently on lisinopril hydrochlorothiazide, and their blood pressure today is 137/69. Patient denies any lightheadedness or dizziness. Patient denies headaches, blurred vision, chest pains, shortness of breath, or weakness. Denies any side effects from medication and is content with current medication.   Hyperlipidemia Patient is coming in for recheck of his hyperlipidemia. The patient is currently taking Crestor. They deny any issues with myalgias or history of liver damage from it. They deny any focal numbness or weakness or chest pain.  His cholesterol is up on his blood work from yesterday, refocus on diet lifestyle changes  Relevant past medical, surgical, family and social history reviewed and updated as indicated. Interim medical history since our last visit reviewed. Allergies and medications reviewed and updated.  Review of Systems  Constitutional: Negative for chills and fever.  Eyes: Negative for discharge.  Respiratory: Negative for shortness of breath and wheezing.   Cardiovascular: Negative for chest pain and leg swelling.  Musculoskeletal: Negative for back pain and gait problem.  Skin: Negative for rash.  Neurological: Negative for dizziness, weakness and  light-headedness.  All other systems reviewed and are negative.   Per HPI unless specifically indicated above   Allergies as of 07/29/2019      Reactions   Doxycycline    Sulfa Antibiotics Rash      Medication List       Accurate as of July 29, 2019  2:56 PM. If you have any questions, ask your nurse or doctor.        aspirin 81 MG tablet Take 325 mg by mouth daily.   blood glucose meter kit and supplies Kit Accucheck Meter. Check BS BID and prn Dx Code E11.9   fenofibrate 160 MG tablet Take 1 tablet (160 mg total) by mouth daily.   fish oil-omega-3 fatty acids 1000 MG capsule Take 1 g by mouth daily.   glipiZIDE 10 MG 24 hr tablet Commonly known as: GLUCOTROL XL Take 1 tablet (10 mg total) by mouth daily with breakfast.   glucose blood test strip Commonly known as: Accu-Chek Aviva Plus USE TO CHECK BLOOD SUGAR TWICE DAILY   Janumet 50-1000 MG tablet Generic drug: sitaGLIPtin-metformin Take 1 tablet by mouth 2 (two) times daily with a meal.   Jardiance 25 MG Tabs tablet Generic drug: empagliflozin Take 25 mg by mouth daily before breakfast. What changed:   medication strength  how much to take  when to take this  additional instructions Changed by: Fransisca Kaufmann Barnard Sharps, MD   LifeScan Unistik 2 Misc 1 strip by Does not apply route daily. Test 1X per day-- dx 250.02   lisinopril-hydrochlorothiazide 20-12.5 MG tablet Commonly known as:  ZESTORETIC Take 2 tablets by mouth daily.   MENS 50+ ADVANCED PO Take by mouth.   nitroGLYCERIN 0.4 MG SL tablet Commonly known as: NITROSTAT Place 1 tablet (0.4 mg total) under the tongue every 5 (five) minutes as needed.   rosuvastatin 40 MG tablet Commonly known as: CRESTOR Take 1 tablet (40 mg total) by mouth daily.   vitamin C 500 MG tablet Commonly known as: ASCORBIC ACID Take 500 mg by mouth daily.   Vitamin D 50 MCG (2000 UT) tablet Take 2,000 Units by mouth daily.        Objective:   BP  137/69   Pulse 70   Temp 98.7 F (37.1 C) (Temporal)   Ht 5' 9"  (1.753 m)   Wt 196 lb (88.9 kg)   SpO2 96%   BMI 28.94 kg/m   Wt Readings from Last 3 Encounters:  07/29/19 196 lb (88.9 kg)  04/29/19 193 lb 6.4 oz (87.7 kg)  09/22/18 195 lb 3.2 oz (88.5 kg)    Physical Exam Vitals and nursing note reviewed.  Constitutional:      General: He is not in acute distress.    Appearance: He is well-developed. He is not diaphoretic.  Eyes:     General: No scleral icterus.       Right eye: No discharge.     Conjunctiva/sclera: Conjunctivae normal.     Pupils: Pupils are equal, round, and reactive to light.  Neck:     Thyroid: No thyromegaly.  Cardiovascular:     Rate and Rhythm: Normal rate and regular rhythm.     Heart sounds: Normal heart sounds. No murmur.  Pulmonary:     Effort: Pulmonary effort is normal. No respiratory distress.     Breath sounds: Normal breath sounds. No wheezing.  Musculoskeletal:        General: Normal range of motion.     Cervical back: Neck supple.  Lymphadenopathy:     Cervical: No cervical adenopathy.  Skin:    General: Skin is warm and dry.     Findings: No rash.  Neurological:     Mental Status: He is alert and oriented to person, place, and time.     Coordination: Coordination normal.  Psychiatric:        Behavior: Behavior normal.     Results for orders placed or performed in visit on 07/28/19  hgba1c  Result Value Ref Range   HB A1C (BAYER DCA - WAIVED) 7.9 (H) <7.0 %  Lipid panel  Result Value Ref Range   Cholesterol, Total 194 100 - 199 mg/dL   Triglycerides 197 (H) 0 - 149 mg/dL   HDL 38 (L) >39 mg/dL   VLDL Cholesterol Cal 35 5 - 40 mg/dL   LDL Chol Calc (NIH) 121 (H) 0 - 99 mg/dL   Chol/HDL Ratio 5.1 (H) 0.0 - 5.0 ratio  CMP14+EGFR  Result Value Ref Range   Glucose 123 (H) 65 - 99 mg/dL   BUN 18 8 - 27 mg/dL   Creatinine, Ser 0.87 0.76 - 1.27 mg/dL   GFR calc non Af Amer 89 >59 mL/min/1.73   GFR calc Af Amer 103 >59  mL/min/1.73   BUN/Creatinine Ratio 21 10 - 24   Sodium 140 134 - 144 mmol/L   Potassium 3.8 3.5 - 5.2 mmol/L   Chloride 99 96 - 106 mmol/L   CO2 24 20 - 29 mmol/L   Calcium 9.7 8.6 - 10.2 mg/dL   Total Protein 7.2 6.0 - 8.5  g/dL   Albumin 4.4 3.8 - 4.8 g/dL   Globulin, Total 2.8 1.5 - 4.5 g/dL   Albumin/Globulin Ratio 1.6 1.2 - 2.2   Bilirubin Total 0.3 0.0 - 1.2 mg/dL   Alkaline Phosphatase 46 39 - 117 IU/L   AST 29 0 - 40 IU/L   ALT 32 0 - 44 IU/L  CBC with Differential/Platelet  Result Value Ref Range   WBC 7.5 3.4 - 10.8 x10E3/uL   RBC 5.11 4.14 - 5.80 x10E6/uL   Hemoglobin 15.3 13.0 - 17.7 g/dL   Hematocrit 44.7 37.5 - 51.0 %   MCV 88 79 - 97 fL   MCH 29.9 26.6 - 33.0 pg   MCHC 34.2 31.5 - 35.7 g/dL   RDW 13.0 11.6 - 15.4 %   Platelets 239 150 - 450 x10E3/uL   Neutrophils 53 Not Estab. %   Lymphs 32 Not Estab. %   Monocytes 8 Not Estab. %   Eos 6 Not Estab. %   Basos 1 Not Estab. %   Neutrophils Absolute 4.0 1.4 - 7.0 x10E3/uL   Lymphocytes Absolute 2.4 0.7 - 3.1 x10E3/uL   Monocytes Absolute 0.6 0.1 - 0.9 x10E3/uL   EOS (ABSOLUTE) 0.5 (H) 0.0 - 0.4 x10E3/uL   Basophils Absolute 0.1 0.0 - 0.2 x10E3/uL   Immature Granulocytes 0 Not Estab. %   Immature Grans (Abs) 0.0 0.0 - 0.1 x10E3/uL    Assessment & Plan:   Problem List Items Addressed This Visit      Cardiovascular and Mediastinum   Hypertension associated with diabetes ([place])   Relevant Medications   empagliflozin (JARDIANCE) 25 MG TABS tablet     Endocrine   Type II diabetes mellitus (HCC) - Primary (Chronic)   Relevant Medications   empagliflozin (JARDIANCE) 25 MG TABS tablet   Other Relevant Orders   Microalbumin / creatinine urine ratio   hgba1c   CMP14+EGFR   Hyperlipidemia associated with type 2 diabetes mellitus (HCC)   Relevant Medications   empagliflozin (JARDIANCE) 25 MG TABS tablet    Increase Jardiance to 25 mg, continue other medications as is.  No change in blood pressure  medication  Follow up plan: Return in about 3 months (around 10/27/2019), or if symptoms worsen or fail to improve, for Diabetes and hypertension and cholesterol recheck.  Counseling provided for all of the vaccine components Orders Placed This Encounter  Procedures  . Microalbumin / creatinine urine ratio  . hgba1c  . CMP14+EGFR    Caryl Pina, MD [facility] 07/29/2019, 2:56 PM

## 2019-07-30 LAB — MICROALBUMIN / CREATININE URINE RATIO
Creatinine, Urine: 43.4 mg/dL
Microalb/Creat Ratio: <7 mg/g creat (ref 0–29)
Microalbumin, Urine: <3 ug/mL

## 2019-08-07 DIAGNOSIS — R109 Unspecified abdominal pain: Secondary | ICD-10-CM | POA: Diagnosis not present

## 2019-08-07 DIAGNOSIS — N200 Calculus of kidney: Secondary | ICD-10-CM | POA: Diagnosis not present

## 2019-08-13 ENCOUNTER — Other Ambulatory Visit: Payer: Self-pay | Admitting: *Deleted

## 2019-08-13 DIAGNOSIS — E785 Hyperlipidemia, unspecified: Secondary | ICD-10-CM

## 2019-08-13 DIAGNOSIS — E1159 Type 2 diabetes mellitus with other circulatory complications: Secondary | ICD-10-CM

## 2019-08-13 DIAGNOSIS — E1169 Type 2 diabetes mellitus with other specified complication: Secondary | ICD-10-CM

## 2019-08-13 MED ORDER — BD SWAB SINGLE USE REGULAR PADS: MEDICATED_PAD | 3 refills | Status: DC

## 2019-08-13 MED ORDER — LISINOPRIL-HYDROCHLOROTHIAZIDE 20-12.5 MG PO TABS: 2.0000 | ORAL_TABLET | Freq: Every day | ORAL | 2 refills | Status: DC

## 2019-08-13 MED ORDER — ROSUVASTATIN CALCIUM 40 MG PO TABS: 40.0000 mg | ORAL_TABLET | Freq: Every day | ORAL | 2 refills | Status: DC

## 2019-08-13 MED ORDER — GLIPIZIDE ER 10 MG PO TB24: 10.0000 mg | ORAL_TABLET | Freq: Every day | ORAL | 2 refills | Status: DC

## 2019-08-13 MED ORDER — ACCU-CHEK AVIVA PLUS W/DEVICE KIT: PACK | 0 refills | Status: DC

## 2019-08-13 MED ORDER — ACCU-CHEK AVIVA PLUS VI STRP: ORAL_STRIP | 3 refills | Status: DC

## 2019-08-13 MED ORDER — FENOFIBRATE 160 MG PO TABS: 160.0000 mg | ORAL_TABLET | Freq: Every day | ORAL | 2 refills | Status: DC

## 2019-08-19 ENCOUNTER — Telehealth: Payer: Self-pay | Admitting: Family Medicine

## 2019-08-21 ENCOUNTER — Other Ambulatory Visit: Payer: Self-pay | Admitting: *Deleted

## 2019-08-21 DIAGNOSIS — Z8679 Personal history of other diseases of the circulatory system: Secondary | ICD-10-CM | POA: Diagnosis not present

## 2019-08-21 DIAGNOSIS — E119 Type 2 diabetes mellitus without complications: Secondary | ICD-10-CM | POA: Diagnosis not present

## 2019-08-21 DIAGNOSIS — I10 Essential (primary) hypertension: Secondary | ICD-10-CM | POA: Diagnosis not present

## 2019-08-21 DIAGNOSIS — Y929 Unspecified place or not applicable: Secondary | ICD-10-CM | POA: Diagnosis not present

## 2019-08-21 DIAGNOSIS — Z8639 Personal history of other endocrine, nutritional and metabolic disease: Secondary | ICD-10-CM | POA: Diagnosis not present

## 2019-08-21 DIAGNOSIS — R208 Other disturbances of skin sensation: Secondary | ICD-10-CM | POA: Diagnosis not present

## 2019-08-21 DIAGNOSIS — Z23 Encounter for immunization: Secondary | ICD-10-CM | POA: Diagnosis not present

## 2019-08-21 DIAGNOSIS — W3189XA Contact with other specified machinery, initial encounter: Secondary | ICD-10-CM | POA: Diagnosis not present

## 2019-08-21 DIAGNOSIS — S6992XA Unspecified injury of left wrist, hand and finger(s), initial encounter: Secondary | ICD-10-CM | POA: Diagnosis not present

## 2019-08-21 DIAGNOSIS — S68125A Partial traumatic metacarpophalangeal amputation of left ring finger, initial encounter: Secondary | ICD-10-CM | POA: Diagnosis not present

## 2019-08-21 DIAGNOSIS — S61212A Laceration without foreign body of right middle finger without damage to nail, initial encounter: Secondary | ICD-10-CM | POA: Diagnosis not present

## 2019-08-21 DIAGNOSIS — S61215A Laceration without foreign body of left ring finger without damage to nail, initial encounter: Secondary | ICD-10-CM | POA: Diagnosis not present

## 2019-08-21 DIAGNOSIS — W231XXA Caught, crushed, jammed, or pinched between stationary objects, initial encounter: Secondary | ICD-10-CM | POA: Diagnosis not present

## 2019-08-21 DIAGNOSIS — Y9389 Activity, other specified: Secondary | ICD-10-CM | POA: Diagnosis not present

## 2019-08-21 DIAGNOSIS — Y999 Unspecified external cause status: Secondary | ICD-10-CM | POA: Diagnosis not present

## 2019-08-21 MED ORDER — ACCU-CHEK SOFTCLIX LANCETS MISC: 3 refills | Status: DC

## 2019-08-25 DIAGNOSIS — S6710XD Crushing injury of unspecified finger(s), subsequent encounter: Secondary | ICD-10-CM | POA: Diagnosis not present

## 2019-08-25 DIAGNOSIS — S6710XA Crushing injury of unspecified finger(s), initial encounter: Secondary | ICD-10-CM | POA: Insufficient documentation

## 2019-08-25 DIAGNOSIS — S68625A Partial traumatic transphalangeal amputation of left ring finger, initial encounter: Secondary | ICD-10-CM | POA: Insufficient documentation

## 2019-08-25 DIAGNOSIS — S68625D Partial traumatic transphalangeal amputation of left ring finger, subsequent encounter: Secondary | ICD-10-CM | POA: Diagnosis not present

## 2019-09-01 DIAGNOSIS — S68625D Partial traumatic transphalangeal amputation of left ring finger, subsequent encounter: Secondary | ICD-10-CM | POA: Diagnosis not present

## 2019-09-01 DIAGNOSIS — S67195D Crushing injury of left ring finger, subsequent encounter: Secondary | ICD-10-CM | POA: Diagnosis not present

## 2019-09-01 DIAGNOSIS — M25442 Effusion, left hand: Secondary | ICD-10-CM | POA: Diagnosis not present

## 2019-09-01 DIAGNOSIS — S68125D Partial traumatic metacarpophalangeal amputation of left ring finger, subsequent encounter: Secondary | ICD-10-CM | POA: Diagnosis not present

## 2019-09-01 DIAGNOSIS — M19042 Primary osteoarthritis, left hand: Secondary | ICD-10-CM | POA: Diagnosis not present

## 2019-09-01 DIAGNOSIS — S62633D Displaced fracture of distal phalanx of left middle finger, subsequent encounter for fracture with routine healing: Secondary | ICD-10-CM | POA: Diagnosis not present

## 2019-09-01 DIAGNOSIS — S62635B Displaced fracture of distal phalanx of left ring finger, initial encounter for open fracture: Secondary | ICD-10-CM | POA: Diagnosis not present

## 2019-09-01 DIAGNOSIS — E119 Type 2 diabetes mellitus without complications: Secondary | ICD-10-CM | POA: Diagnosis not present

## 2019-09-01 DIAGNOSIS — L02512 Cutaneous abscess of left hand: Secondary | ICD-10-CM | POA: Diagnosis not present

## 2019-09-02 DIAGNOSIS — Z20822 Contact with and (suspected) exposure to covid-19: Secondary | ICD-10-CM | POA: Diagnosis not present

## 2019-09-02 DIAGNOSIS — I96 Gangrene, not elsewhere classified: Secondary | ICD-10-CM | POA: Diagnosis not present

## 2019-09-02 DIAGNOSIS — L02512 Cutaneous abscess of left hand: Secondary | ICD-10-CM | POA: Diagnosis not present

## 2019-09-17 DIAGNOSIS — Z89022 Acquired absence of left finger(s): Secondary | ICD-10-CM | POA: Diagnosis not present

## 2019-10-01 DIAGNOSIS — Z4789 Encounter for other orthopedic aftercare: Secondary | ICD-10-CM | POA: Diagnosis not present

## 2019-10-01 DIAGNOSIS — Z89022 Acquired absence of left finger(s): Secondary | ICD-10-CM | POA: Diagnosis not present

## 2019-11-18 ENCOUNTER — Other Ambulatory Visit: Payer: Medicare HMO

## 2019-11-18 ENCOUNTER — Telehealth: Payer: Self-pay | Admitting: Family Medicine

## 2019-11-18 ENCOUNTER — Other Ambulatory Visit: Payer: Self-pay

## 2019-11-18 DIAGNOSIS — E1169 Type 2 diabetes mellitus with other specified complication: Secondary | ICD-10-CM | POA: Diagnosis not present

## 2019-11-18 LAB — CMP14+EGFR
ALT: 29 IU/L (ref 0–44)
AST: 29 IU/L (ref 0–40)
Albumin/Globulin Ratio: 1.6 (ref 1.2–2.2)
Albumin: 4.5 g/dL (ref 3.8–4.8)
Alkaline Phosphatase: 45 IU/L (ref 39–117)
BUN/Creatinine Ratio: 17 (ref 10–24)
BUN: 20 mg/dL (ref 8–27)
Bilirubin Total: 0.3 mg/dL (ref 0.0–1.2)
CO2: 23 mmol/L (ref 20–29)
Calcium: 9.3 mg/dL (ref 8.6–10.2)
Chloride: 100 mmol/L (ref 96–106)
Creatinine, Ser: 1.18 mg/dL (ref 0.76–1.27)
GFR calc Af Amer: 73 mL/min/{1.73_m2} (ref >59)
GFR calc non Af Amer: 63 mL/min/{1.73_m2} (ref >59)
Globulin, Total: 2.8 g/dL (ref 1.5–4.5)
Glucose: 120 mg/dL — ABNORMAL HIGH (ref 65–99)
Potassium: 3.9 mmol/L (ref 3.5–5.2)
Sodium: 139 mmol/L (ref 134–144)
Total Protein: 7.3 g/dL (ref 6.0–8.5)

## 2019-11-18 LAB — BAYER DCA HB A1C WAIVED: HB A1C (BAYER DCA - WAIVED): 8.6 % — ABNORMAL HIGH (ref <7.0)

## 2019-11-18 NOTE — Telephone Encounter (Signed)
A1c not in computer yet. Can you confirm today's result?

## 2019-11-19 ENCOUNTER — Ambulatory Visit (INDEPENDENT_AMBULATORY_CARE_PROVIDER_SITE_OTHER): Payer: Medicare HMO | Admitting: Family Medicine

## 2019-11-19 ENCOUNTER — Encounter: Payer: Self-pay | Admitting: Family Medicine

## 2019-11-19 VITALS — BP 113/61 | HR 62 | Temp 98.1°F | Ht 69.0 in | Wt 190.5 lb

## 2019-11-19 DIAGNOSIS — E1159 Type 2 diabetes mellitus with other circulatory complications: Secondary | ICD-10-CM

## 2019-11-19 DIAGNOSIS — E1169 Type 2 diabetes mellitus with other specified complication: Secondary | ICD-10-CM | POA: Diagnosis not present

## 2019-11-19 DIAGNOSIS — E785 Hyperlipidemia, unspecified: Secondary | ICD-10-CM | POA: Diagnosis not present

## 2019-11-19 DIAGNOSIS — I10 Essential (primary) hypertension: Secondary | ICD-10-CM | POA: Diagnosis not present

## 2019-11-19 MED ORDER — METFORMIN HCL 1000 MG PO TABS: 1000.0000 mg | ORAL_TABLET | Freq: Two times a day (BID) | ORAL | 3 refills | Status: DC

## 2019-11-19 MED ORDER — RYBELSUS 3 MG PO TABS: 3.0000 mg | ORAL_TABLET | Freq: Every day | ORAL | 0 refills | Status: DC

## 2019-11-19 NOTE — Progress Notes (Signed)
BP 113/61   Pulse 62   Temp 98.1 F (36.7 C) (Temporal)   Ht 5' 9"  (1.753 m)   Wt 190 lb 8 oz (86.4 kg)   BMI 28.13 kg/m    Subjective:   Patient ID: Philip Huang, male    DOB: 08/28/1950, 69 y.o.   MRN: [id number]  HPI: [name] is a 69 y.o. male presenting on 11/19/2019 for Medical Management of Chronic Issues   HPI Type 2 diabetes mellitus Patient comes in today for recheck of his diabetes. Patient has been currently taking Janumet and Jardiance and glipizide. Patient is currently on an ACE inhibitor/ARB. Patient has not seen an ophthalmologist this year. Patient denies any issues with their feet.   Hypertension Patient is currently on lisinopril hydrochlorothiazide, and their blood pressure today is 113/61. Patient denies any lightheadedness or dizziness. Patient denies headaches, blurred vision, chest pains, shortness of breath, or weakness. Denies any side effects from medication and is content with current medication.   Hyperlipidemia Patient is coming in for recheck of his hyperlipidemia. The patient is currently taking Crestor. They deny any issues with myalgias or history of liver damage from it. They deny any focal numbness or weakness or chest pain.   Relevant past medical, surgical, family and social history reviewed and updated as indicated. Interim medical history since our last visit reviewed. Allergies and medications reviewed and updated.  Review of Systems  Constitutional: Negative for chills and fever.  Respiratory: Negative for shortness of breath and wheezing.   Cardiovascular: Negative for chest pain and leg swelling.  Musculoskeletal: Negative for back pain and gait problem.  Skin: Negative for rash.  Neurological: Negative for dizziness, weakness and light-headedness.  All other systems reviewed and are negative.   Per HPI unless specifically indicated above   Allergies as of 11/19/2019      Reactions   Doxycycline    Sulfa Antibiotics Rash      Medication List       Accurate as of November 19, 2019 10:23 AM. If you have any questions, ask your nurse or doctor.        STOP taking these medications   Janumet 50-1000 MG tablet Generic drug: sitaGLIPtin-metformin Stopped by: Fransisca Kaufmann Clydine Parkison, MD     TAKE these medications   Accu-Chek Aviva Plus test strip Generic drug: glucose blood Test BS BID Dx E11.9   Accu-Chek Aviva Plus w/Device Kit TEST BS BID Dx E11.9   Accu-Chek Softclix Lancets lancets Test BS BID Dx E11.9   aspirin 81 MG tablet Take 325 mg by mouth daily.   B-D SINGLE USE SWABS REGULAR Pads Test BS BID Dx E11.9   blood glucose meter kit and supplies Kit Accucheck Meter. Check BS BID and prn Dx Code E11.9   fenofibrate 160 MG tablet Take 1 tablet (160 mg total) by mouth daily.   fish oil-omega-3 fatty acids 1000 MG capsule Take 1 g by mouth daily.   glipiZIDE 10 MG 24 hr tablet Commonly known as: GLUCOTROL XL Take 1 tablet (10 mg total) by mouth daily with breakfast.   Jardiance 25 MG Tabs tablet Generic drug: empagliflozin Take 25 mg by mouth daily before breakfast.   lisinopril-hydrochlorothiazide 20-12.5 MG tablet Commonly known as: ZESTORETIC Take 2 tablets by mouth daily.   MENS 50+ ADVANCED PO Take by mouth.   metFORMIN 1000 MG tablet Commonly known as: GLUCOPHAGE Take 1 tablet (1,000 mg total) by mouth 2 (two) times daily with a meal.  Started by: Fransisca Kaufmann Annalyn Blecher, MD   nitroGLYCERIN 0.4 MG SL tablet Commonly known as: NITROSTAT Place 1 tablet (0.4 mg total) under the tongue every 5 (five) minutes as needed.   rosuvastatin 40 MG tablet Commonly known as: CRESTOR Take 1 tablet (40 mg total) by mouth daily.   Rybelsus 3 MG Tabs Generic drug: Semaglutide Take 3 mg by mouth daily. Started by: Worthy Rancher, MD   vitamin C 500 MG tablet Commonly known as: ASCORBIC ACID Take 500 mg by mouth daily.   Vitamin D 50 MCG (2000 UT) tablet Take  2,000 Units by mouth daily.        Objective:   BP 113/61   Pulse 62   Temp 98.1 F (36.7 C) (Temporal)   Ht 5' 9"  (1.753 m)   Wt 190 lb 8 oz (86.4 kg)   BMI 28.13 kg/m   Wt Readings from Last 3 Encounters:  11/19/19 190 lb 8 oz (86.4 kg)  07/29/19 196 lb (88.9 kg)  04/29/19 193 lb 6.4 oz (87.7 kg)    Physical Exam Vitals and nursing note reviewed.  Constitutional:      General: He is not in acute distress.    Appearance: He is well-developed. He is not diaphoretic.  Eyes:     General: No scleral icterus.    Conjunctiva/sclera: Conjunctivae normal.  Neck:     Thyroid: No thyromegaly.  Cardiovascular:     Rate and Rhythm: Normal rate and regular rhythm.     Heart sounds: Normal heart sounds. No murmur.  Pulmonary:     Effort: Pulmonary effort is normal. No respiratory distress.     Breath sounds: Normal breath sounds. No wheezing.  Musculoskeletal:        General: Normal range of motion.     Cervical back: Neck supple.  Lymphadenopathy:     Cervical: No cervical adenopathy.  Skin:    General: Skin is warm and dry.     Findings: No rash.  Neurological:     Mental Status: He is alert and oriented to person, place, and time.     Coordination: Coordination normal.  Psychiatric:        Behavior: Behavior normal.     Results for orders placed or performed in visit on 11/18/19  CMP14+EGFR  Result Value Ref Range   Glucose 120 (H) 65 - 99 mg/dL   BUN 20 8 - 27 mg/dL   Creatinine, Ser 1.18 0.76 - 1.27 mg/dL   GFR calc non Af Amer 63 >59 mL/min/1.73   GFR calc Af Amer 73 >59 mL/min/1.73   BUN/Creatinine Ratio 17 10 - 24   Sodium 139 134 - 144 mmol/L   Potassium 3.9 3.5 - 5.2 mmol/L   Chloride 100 96 - 106 mmol/L   CO2 23 20 - 29 mmol/L   Calcium 9.3 8.6 - 10.2 mg/dL   Total Protein 7.3 6.0 - 8.5 g/dL   Albumin 4.5 3.8 - 4.8 g/dL   Globulin, Total 2.8 1.5 - 4.5 g/dL   Albumin/Globulin Ratio 1.6 1.2 - 2.2   Bilirubin Total 0.3 0.0 - 1.2 mg/dL   Alkaline  Phosphatase 45 39 - 117 IU/L   AST 29 0 - 40 IU/L   ALT 29 0 - 44 IU/L  hgba1c  Result Value Ref Range   HB A1C (BAYER DCA - WAIVED) 8.6 (H) <7.0 %    Assessment & Plan:   Problem List Items Addressed This Visit      Cardiovascular  and Mediastinum   Hypertension associated with diabetes ([place])   Relevant Medications   Semaglutide (RYBELSUS) 3 MG TABS   metFORMIN (GLUCOPHAGE) 1000 MG tablet     Endocrine   Type II diabetes mellitus (HCC) - Primary (Chronic)   Relevant Medications   Semaglutide (RYBELSUS) 3 MG TABS   metFORMIN (GLUCOPHAGE) 1000 MG tablet   Hyperlipidemia associated with type 2 diabetes mellitus (HCC)   Relevant Medications   Semaglutide (RYBELSUS) 3 MG TABS   metFORMIN (GLUCOPHAGE) 1000 MG tablet      We will switch to Janumet for Rybelsus and Metformin.  Continue other current medication. Follow up plan: Return in about 3 months (around 02/18/2020), or if symptoms worsen or fail to improve, for Diabetes and hypertension and cholesterol, needs an appointment with Lottie Dawson pharmacist.  Counseling provided for all of the vaccine components No orders of the defined types were placed in this encounter.   Caryl Pina, MD [facility] 11/19/2019, 10:23 AM

## 2019-11-20 ENCOUNTER — Ambulatory Visit (INDEPENDENT_AMBULATORY_CARE_PROVIDER_SITE_OTHER): Payer: Medicare HMO | Admitting: Pharmacist

## 2019-11-20 ENCOUNTER — Other Ambulatory Visit: Payer: Self-pay

## 2019-11-20 DIAGNOSIS — E1169 Type 2 diabetes mellitus with other specified complication: Secondary | ICD-10-CM | POA: Diagnosis not present

## 2019-11-20 NOTE — Progress Notes (Signed)
         S:     76yoM with PMH significant for diabetes, HTN, HLD arrives with wife, [facility], to pharmacy clinic for diabetes evaluation, education, and management. Patient was referred and last seen by Primary Care Provider on 11/19/14.  Most recent A1c has increased from 7.9% to 8.6%.  Insurance coverage/medication affordability: [employer] medicare  Patient reports adherence with medications.  Current diabetes medications include: jardiance, metformin, glipizide  Current hypertension medications include:  Lisinopril/hctz  Current hyperlipidemia medications include: rosuvastatin, fish oil, fenofibrate (*LDL 121 on 07/2019)  Patient denies hypoglycemic events.  Patient reported dietary habits: Eats 3 meals/day  Breakfast: cereal, bananas, biscuits, milk (discussed cutting bananas in half and preparing eggs for breakfast to cut down on carbs, breakfast protein)    Lunch: burgers, sandwiches, usually meat/potato/veggie -->provided info on the "plate method" to have a balanaced portion of carbs, protein, veggie    Dinner: same as lunch    Drinks: pepsi zero, coffee no sugar, water -->encouraged increased water intake  Patient-reported exercise habits: n/a, encouraged  O:  Lab Results  Component Value Date   HGBA1C 8.6 (H) 11/18/2019   Lipid Panel     Component Value Date/Time   CHOL 194 07/28/2019 0805   CHOL 168 02/02/2013 0843   TRIG 197 (H) 07/28/2019 0805   TRIG 209 (H) 10/22/2014 0810   TRIG 164 (H) 02/02/2013 0843   HDL 38 (L) 07/28/2019 0805   HDL 36 (L) 10/22/2014 0810   HDL 43 02/02/2013 0843   CHOLHDL 5.1 (H) 07/28/2019 0805   LDLCALC 121 (H) 07/28/2019 0805   LDLCALC 100 (H) 03/12/2014 1600   LDLCALC 92 02/02/2013 0843   Home fasting blood sugars: 100-150 2 hour post-meal/random blood sugars: not checking  A/P:  Diabetes longstanding currently uncontrolled. Patient is able to verbalize appropriate hypoglycemia management plan. Wife is very involved  in his care.  Patient is adherent with medication. Control is suboptimal due to diet/lifestyle.  -Started GLP-1 Rybelsus (generic name: semaglutide) 3mg  daily.  30-day sample given LOT [id number] EXP 10/22  Paperwork started for Rybelsus [employer] patient assistance program  Plan to titrate to 7mg  as tolerated  -Continued SGLT2-I Jardiance (generic name: empagliflozin) 25mg  daily  -Continue metformin & glipizide for now  -Discontinue januvia  -Extensively discussed pathophysiology of diabetes, recommended lifestyle interventions, dietary effects on blood sugar control  -Counseled on s/sx of and management of hypoglycemia  -Next A1C anticipated July 2021--3 months.    Written patient instructions provided.  Total time in face to face counseling 32 minutes.   Follow up PCP Clinic Visit in July 2021    Regina Eck, PharmD, BCPS Clinical Pharmacist, [place]  II Phone [phone]

## 2019-11-24 ENCOUNTER — Other Ambulatory Visit: Payer: Self-pay | Admitting: Family Medicine

## 2019-11-26 ENCOUNTER — Other Ambulatory Visit: Payer: Self-pay | Admitting: *Deleted

## 2019-11-26 MED ORDER — TRUE METRIX AIR GLUCOSE METER W/DEVICE KIT: PACK | 0 refills | Status: DC

## 2019-11-26 MED ORDER — TRUEPLUS LANCETS 33G MISC: 3 refills | Status: DC

## 2019-11-26 MED ORDER — TRUE METRIX BLOOD GLUCOSE TEST VI STRP: ORAL_STRIP | 3 refills | Status: DC

## 2019-11-30 ENCOUNTER — Telehealth: Payer: Self-pay | Admitting: Family Medicine

## 2019-11-30 NOTE — Telephone Encounter (Signed)
Pt is receiving the True Metrix by [facility] soon

## 2019-11-30 NOTE — Telephone Encounter (Signed)
°  Prescription Request  11/30/2019  What is the name of the medication or equipment? Needs NEW Machine-Accu check Avia?  Have you contacted your pharmacy to request a refill? (if applicable) Yes  Which pharmacy would you like this sent to? [employer] mail order   Patient notified that their request is being sent to the clinical staff for review and that they should receive a response within 2 business days.   [name]'s pt.  [facility] Phone #[phone]  Order Number # [id number]

## 2019-12-14 DIAGNOSIS — D0361 Melanoma in situ of right upper limb, including shoulder: Secondary | ICD-10-CM | POA: Diagnosis not present

## 2019-12-14 DIAGNOSIS — C44612 Basal cell carcinoma of skin of right upper limb, including shoulder: Secondary | ICD-10-CM | POA: Diagnosis not present

## 2019-12-14 DIAGNOSIS — D225 Melanocytic nevi of trunk: Secondary | ICD-10-CM | POA: Diagnosis not present

## 2019-12-15 ENCOUNTER — Telehealth: Payer: Self-pay | Admitting: Family Medicine

## 2019-12-15 NOTE — Telephone Encounter (Signed)
Call placed to patient and patient's wife [facility]) to inform them that Rybelsus patient assistance medication has not arrived to PCP office. Patient has #4 tabs remaining.  Medication should arrive to PCP office this week. Will notify patient of arrival at that time.  FBG 90-130, post prandials up to 180-200.  BGs overall are much improved.   Denies hypglycemia.  Admits to eating better.  Will continue to follow

## 2019-12-18 ENCOUNTER — Telehealth: Payer: Self-pay | Admitting: Family Medicine

## 2019-12-18 NOTE — Telephone Encounter (Signed)
  Incoming call from patient's wife, Hope, requesting [employer] Patient Assistance Medication has not been delivered to PCP office, expect any day (Rybelsus 7mg  daily).  BGs stable/within range-->continue current management  Will give Rybelsus 3mg  tablets #10 to bridge supply [id number] EXP3/31/23  Samples left up front for patient to pick up

## 2019-12-22 ENCOUNTER — Telehealth: Payer: Self-pay | Admitting: Pharmacist

## 2019-12-22 NOTE — Telephone Encounter (Signed)
Patient shipment of Rybelsus 7mg  tablets #60 (take 1 tablet daily) has arrived to PCP office.  Patient and wife made aware that medication will be left up front for patient to pick up.  Counseled patient on side effects and how to best avoid nausea  Denies hypoglycemia, FBG range 120-160  Patient will increase to 7mg  daily (from 3mg  daily)   [id number], EXP 03-05-2022

## 2019-12-28 DIAGNOSIS — D0361 Melanoma in situ of right upper limb, including shoulder: Secondary | ICD-10-CM | POA: Diagnosis not present

## 2019-12-28 DIAGNOSIS — L988 Other specified disorders of the skin and subcutaneous tissue: Secondary | ICD-10-CM | POA: Diagnosis not present

## 2020-01-07 ENCOUNTER — Telehealth: Payer: Self-pay | Admitting: Family Medicine

## 2020-01-07 NOTE — Telephone Encounter (Signed)
Pts wife states that her husband had cancer removed from his arm and they are wanting him to drink boost and she is wanting to see if a rx can be sent in to [facility] for the Boost and low sugar protein bars. Requesting a call from [place].

## 2020-01-08 MED ORDER — BOOST GLUCOSE CONTROL PO LIQD: 1.0000 | Freq: Every day | ORAL | 1 refills | Status: DC

## 2020-01-08 NOTE — Addendum Note (Signed)
Addended by: Lottie Dawson D on: 01/08/2020 04:28 PM   Modules accepted: Orders

## 2020-01-08 NOTE — Telephone Encounter (Signed)
BOOST MEDICAL SUPPLEMENT (GLUCOSE CONTROL) CALLED IN TO [employer] [facility]  SPOKE WITH WIFE WHO VERBALIZES UNDERSTANDING THAT SUPPLEMENT MAY NOT BE COVERED BY MEDICARE

## 2020-02-18 ENCOUNTER — Telehealth: Payer: Self-pay | Admitting: Pharmacist

## 2020-02-18 ENCOUNTER — Other Ambulatory Visit: Payer: Self-pay

## 2020-02-18 ENCOUNTER — Ambulatory Visit (INDEPENDENT_AMBULATORY_CARE_PROVIDER_SITE_OTHER): Payer: Medicare HMO | Admitting: Family Medicine

## 2020-02-18 ENCOUNTER — Encounter: Payer: Self-pay | Admitting: Family Medicine

## 2020-02-18 VITALS — BP 122/65 | HR 57 | Temp 98.2°F | Ht 69.0 in | Wt 187.0 lb

## 2020-02-18 DIAGNOSIS — I10 Essential (primary) hypertension: Secondary | ICD-10-CM | POA: Diagnosis not present

## 2020-02-18 DIAGNOSIS — E1169 Type 2 diabetes mellitus with other specified complication: Secondary | ICD-10-CM | POA: Diagnosis not present

## 2020-02-18 DIAGNOSIS — E1159 Type 2 diabetes mellitus with other circulatory complications: Secondary | ICD-10-CM | POA: Diagnosis not present

## 2020-02-18 DIAGNOSIS — E785 Hyperlipidemia, unspecified: Secondary | ICD-10-CM

## 2020-02-18 LAB — BAYER DCA HB A1C WAIVED: HB A1C (BAYER DCA - WAIVED): 7.7 % — ABNORMAL HIGH (ref <7.0)

## 2020-02-18 NOTE — Telephone Encounter (Signed)
Diabetes Manangement  Patient's A1c has decreased to 7.7%  Met with patient to discuss progress-->Still has to work on diet/appetite   INCREASE RYBELSUS TO 14 MG daily  WILL FILL OUT REFILL REQUEST FORM FOR NOVO NORDISK PAP (faxes today)  Continue metformin  Encouraged patient to call if needs arise

## 2020-02-18 NOTE — Progress Notes (Signed)
BP 122/65   Pulse (!) 57   Temp 98.2 F (36.8 C)   Ht 5' 9"  (1.753 m)   Wt 187 lb (84.8 kg)   SpO2 97%   BMI 27.62 kg/m    Subjective:   Patient ID: Philip Huang, male    DOB: 1951-01-07, 69 y.o.   MRN: [id number]  HPI: Philip Huang is a 69 y.o. male presenting on 02/18/2020 for Medical Management of Chronic Issues and Diabetes   HPI Type 2 diabetes mellitus Patient comes in today for recheck of his diabetes. Patient has been currently taking Jardiance and Metformin and Rybelsus. Patient is currently on an ACE inhibitor/ARB. Patient has not seen an ophthalmologist this year. Patient denies any issues with their feet. The symptom started onset as an adult hypertension and hyperlipidemia ARE RELATED TO DM.  A1c is 7.7 which is improved but still up.  Hypertension Patient is currently on lisinopril hydrochlorothiazide, and their blood pressure today is 122/65. Patient denies any lightheadedness or dizziness. Patient denies headaches, blurred vision, chest pains, shortness of breath, or weakness. Denies any side effects from medication and is content with current medication.   Hyperlipidemia Patient is coming in for recheck of his hyperlipidemia. The patient is currently taking Crestor. They deny any issues with myalgias or history of liver damage from it. They deny any focal numbness or weakness or chest pain.   Relevant past medical, surgical, family and social history reviewed and updated as indicated. Interim medical history since our last visit reviewed. Allergies and medications reviewed and updated.  Review of Systems  Constitutional: Negative for chills and fever.  Respiratory: Negative for shortness of breath and wheezing.   Cardiovascular: Negative for chest pain and leg swelling.  Musculoskeletal: Negative for back pain and gait problem.  Skin: Negative for rash.  Neurological: Negative for dizziness, seizures and weakness.  All other systems  reviewed and are negative.   Per HPI unless specifically indicated above   Allergies as of 02/18/2020      Reactions   Doxycycline    Sulfa Antibiotics Rash      Medication List       Accurate as of February 18, 2020  9:23 AM. If you have any questions, ask your nurse or doctor.        STOP taking these medications   Boost Glucose Control Liqd Stopped by: Philip Kaufmann Chastin Garlitz, MD     TAKE these medications   Accu-Chek Aviva Plus test strip Generic drug: glucose blood Test BS BID Dx E11.9   True Metrix Blood Glucose Test test strip Generic drug: glucose blood Test BS BID Dx E11.9   aspirin 81 MG tablet Take 81 mg by mouth daily.   B-D SINGLE USE SWABS REGULAR Pads Test BS BID Dx E11.9   blood glucose meter kit and supplies Kit Accucheck Meter. Check BS BID and prn Dx Code E11.9   fenofibrate 160 MG tablet Take 1 tablet (160 mg total) by mouth daily.   fish oil-omega-3 fatty acids 1000 MG capsule Take 1 g by mouth daily.   glipiZIDE 10 MG 24 hr tablet Commonly known as: GLUCOTROL XL Take 1 tablet (10 mg total) by mouth daily with breakfast.   Jardiance 25 MG Tabs tablet Generic drug: empagliflozin Take 25 mg by mouth daily before breakfast.   lisinopril-hydrochlorothiazide 20-12.5 MG tablet Commonly known as: ZESTORETIC Take 1 tablet by mouth daily. What changed: Another medication with the same name was removed. Continue taking this  medication, and follow the directions you see here. Changed by: Worthy Rancher, MD   MENS 50+ ADVANCED PO Take by mouth.   metFORMIN 1000 MG tablet Commonly known as: GLUCOPHAGE Take 1 tablet (1,000 mg total) by mouth 2 (two) times daily with a meal.   nitroGLYCERIN 0.4 MG SL tablet Commonly known as: NITROSTAT Place 1 tablet (0.4 mg total) under the tongue every 5 (five) minutes as needed.   rosuvastatin 40 MG tablet Commonly known as: CRESTOR Take 1 tablet (40 mg total) by mouth daily.   Rybelsus 7 MG  Tabs Generic drug: Semaglutide Take 1 tablet by mouth in the morning.   Semaglutide 14 MG Tabs Take 14 mg by mouth daily.   True Metrix Air Glucose Meter w/Device Kit Test BS BID Dx E11.9   TRUEplus Lancets 33G Misc Test BS BID Dx E11.9   Vitamin D 50 MCG (2000 UT) tablet Take 2,000 Units by mouth daily.        Objective:   BP 122/65   Pulse (!) 57   Temp 98.2 F (36.8 C)   Ht 5' 9"  (1.753 m)   Wt 187 lb (84.8 kg)   SpO2 97%   BMI 27.62 kg/m   Wt Readings from Last 3 Encounters:  02/18/20 187 lb (84.8 kg)  11/19/19 190 lb 8 oz (86.4 kg)  07/29/19 196 lb (88.9 kg)    Physical Exam Vitals and nursing note reviewed.  Constitutional:      General: He is not in acute distress.    Appearance: He is well-developed. He is not diaphoretic.  Eyes:     General: No scleral icterus.    Conjunctiva/sclera: Conjunctivae normal.  Neck:     Thyroid: No thyromegaly.  Cardiovascular:     Rate and Rhythm: Normal rate and regular rhythm.     Heart sounds: Normal heart sounds. No murmur heard.   Pulmonary:     Effort: Pulmonary effort is normal. No respiratory distress.     Breath sounds: Normal breath sounds. No wheezing.  Musculoskeletal:        General: Normal range of motion.     Cervical back: Neck supple.  Lymphadenopathy:     Cervical: No cervical adenopathy.  Skin:    General: Skin is warm and dry.     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 No rash.  Neurological:     Mental Status: He is alert and oriented to person, place, and time.     Coordination: Coordination normal.  Psychiatric:        Behavior: Behavior normal.       Assessment & Plan:   Problem List Items Addressed This Visit      Cardiovascular and Mediastinum   Hypertension associated with diabetes ([place])   Relevant Medications   lisinopril-hydrochlorothiazide (ZESTORETIC)  20-12.5 MG tablet   Semaglutide 14 MG TABS     Endocrine   Type II diabetes mellitus (HCC) - Primary (Chronic)   Relevant Medications   lisinopril-hydrochlorothiazide (ZESTORETIC) 20-12.5 MG tablet   Semaglutide 14 MG TABS   Other Relevant Orders   Bayer DCA Hb A1c Waived   Hyperlipidemia associated with type 2 diabetes mellitus (HCC)   Relevant Medications   lisinopril-hydrochlorothiazide (ZESTORETIC) 20-12.5 MG tablet   Semaglutide 14 MG TABS      A1c is 7.7, will increase to 14 mg of Rybelsus daily.  No other change medication. Follow up plan: Return in about 3 months (around 05/20/2020), or if symptoms worsen or fail to improve, for Patient needs follow-up with Almyra Free in 1 month, follow-up with me in 3 months diabetes.  Counseling provided for all of the vaccine components Orders Placed This Encounter  Procedures  . Bayer [facility] Hb A1c [place], MD [facility] 02/18/2020, 9:23 AM

## 2020-03-07 ENCOUNTER — Encounter: Payer: Self-pay | Admitting: Pharmacist

## 2020-03-07 NOTE — Progress Notes (Signed)
Rybelsus sample given to bridge patient assistance supply [id number], EXP 3/23 Patient tolerating 14mg  daily well with no side effects Continue current management

## 2020-03-10 ENCOUNTER — Telehealth: Payer: Self-pay | Admitting: Pharmacist

## 2020-03-10 NOTE — Telephone Encounter (Signed)
Call placed to [facility] delayed 1-2 weeks Sample should last through the weekend Patient instructed to call [phone] on Monday if shipment not received

## 2020-03-21 DIAGNOSIS — Z85820 Personal history of malignant melanoma of skin: Secondary | ICD-10-CM | POA: Diagnosis not present

## 2020-03-21 DIAGNOSIS — D225 Melanocytic nevi of trunk: Secondary | ICD-10-CM | POA: Diagnosis not present

## 2020-03-21 DIAGNOSIS — Z1283 Encounter for screening for malignant neoplasm of skin: Secondary | ICD-10-CM | POA: Diagnosis not present

## 2020-03-21 DIAGNOSIS — D2272 Melanocytic nevi of left lower limb, including hip: Secondary | ICD-10-CM | POA: Diagnosis not present

## 2020-03-21 DIAGNOSIS — D485 Neoplasm of uncertain behavior of skin: Secondary | ICD-10-CM | POA: Diagnosis not present

## 2020-03-21 DIAGNOSIS — B353 Tinea pedis: Secondary | ICD-10-CM | POA: Diagnosis not present

## 2020-03-21 DIAGNOSIS — Z08 Encounter for follow-up examination after completed treatment for malignant neoplasm: Secondary | ICD-10-CM | POA: Diagnosis not present

## 2020-03-22 ENCOUNTER — Encounter: Payer: Self-pay | Admitting: Pharmacist

## 2020-03-22 ENCOUNTER — Other Ambulatory Visit: Payer: Self-pay

## 2020-03-22 ENCOUNTER — Ambulatory Visit (INDEPENDENT_AMBULATORY_CARE_PROVIDER_SITE_OTHER): Payer: Medicare HMO | Admitting: Pharmacist

## 2020-03-22 VITALS — BP 160/75 | HR 76

## 2020-03-22 DIAGNOSIS — N183 Chronic kidney disease, stage 3 unspecified: Secondary | ICD-10-CM | POA: Diagnosis not present

## 2020-03-22 DIAGNOSIS — E1122 Type 2 diabetes mellitus with diabetic chronic kidney disease: Secondary | ICD-10-CM | POA: Diagnosis not present

## 2020-03-22 NOTE — Progress Notes (Signed)
[id number]    03/22/2020 Name: Philip Huang MRN: [id number] DOB: [birth year]   S: 69yoM with PMH significant for diabetes, HTN, HLD arrives with wife, [name], to pharmacy clinic for diabetes evaluation, education, and management. Patient was referred and last seen by Primary Care Provider on 02/18/20.  Most recent A1c has decreased from  8.6%--> 7.7% Insurance coverage/medication affordability: [employer]  Patient reports adherence with medications.  Current diabetes medications include: Rybelsus, jardiance, metformin, glipizide  Current hypertension medications include:  Lisinopril/hctz  Current hyperlipidemia medications include: rosuvastatin, fish oil, fenofibrate (*LDL 121 on 07/2019)  Patient denies hypoglycemic events.   Patient denies hypoglycemic events.   Patient reported dietary habits: Eats 3 meals/day He is working on diet, improved eating habits  Discussed meal planning options and Plate method for healthy eating  Avoid sugary drinks and desserts  Incorporate balanced protein, non starchy veggies, 1 serving of carbohydrate with each meal  Increase water intake  Increase physical activity as able  Patient-reported exercise habits: n/a   O:  Lab Results  Component Value Date   HGBA1C 7.7 (H) 02/18/2020   Lipid Panel     Component Value Date/Time   CHOL 194 07/28/2019 0805   CHOL 168 02/02/2013 0843   TRIG 197 (H) 07/28/2019 0805   TRIG 209 (H) 10/22/2014 0810   TRIG 164 (H) 02/02/2013 0843   HDL 38 (L) 07/28/2019 0805   HDL 36 (L) 10/22/2014 0810   HDL 43 02/02/2013 0843   CHOLHDL 5.1 (H) 07/28/2019 0805   LDLCALC 121 (H) 07/28/2019 0805   LDLCALC 100 (H) 03/12/2014 1600   LDLCALC 92 02/02/2013 0843    Home fasting blood sugars: 90-100s  2 hour post-meal/random blood sugars: n/a.   A/P: Diabetes longstanding currently uncontrolled. Patient is able to verbalize appropriate hypoglycemia management plan. Wife is very involved in  his care.  Patient is adherent with medication. Control is suboptimal due to diet/lifestyle.  -CONTINUE GLP-1 Rybelsus (generic name: semaglutide) 14mg  daily.  Patient has received new Rybelsus 14mg  supply (38-months via novonordisk patient assistance)  He is tolerating Rybelsus 14mg  daily well; denies side effects  -Continue SGLT2-I Jardiance (generic name: empagliflozin) 25mg  daily  -Continue metformin & glipizide for now  -Extensively discussed pathophysiology of diabetes, recommended lifestyle interventions, dietary effects on blood sugar control  -Counseled on s/sx of and management of hypoglycemia  -Next A1C anticipated 3 months.  Written patient instructions provided.  Total time in face to face counseling 30 minutes.   Follow up PCP Clinic Visit in 3-53months.    Regina Eck, PharmD, BCPS Clinical Pharmacist, [place]  II Phone [phone]

## 2020-03-24 ENCOUNTER — Ambulatory Visit: Payer: Medicare HMO

## 2020-04-08 ENCOUNTER — Other Ambulatory Visit: Payer: Self-pay | Admitting: Family Medicine

## 2020-04-08 DIAGNOSIS — E785 Hyperlipidemia, unspecified: Secondary | ICD-10-CM

## 2020-05-05 ENCOUNTER — Ambulatory Visit: Payer: Medicare Other

## 2020-05-19 ENCOUNTER — Other Ambulatory Visit: Payer: Medicare HMO

## 2020-05-19 ENCOUNTER — Other Ambulatory Visit: Payer: Self-pay

## 2020-05-19 DIAGNOSIS — E1122 Type 2 diabetes mellitus with diabetic chronic kidney disease: Secondary | ICD-10-CM

## 2020-05-19 DIAGNOSIS — E1159 Type 2 diabetes mellitus with other circulatory complications: Secondary | ICD-10-CM

## 2020-05-19 DIAGNOSIS — I152 Hypertension secondary to endocrine disorders: Secondary | ICD-10-CM | POA: Diagnosis not present

## 2020-05-19 DIAGNOSIS — N183 Chronic kidney disease, stage 3 unspecified: Secondary | ICD-10-CM | POA: Diagnosis not present

## 2020-05-19 DIAGNOSIS — E785 Hyperlipidemia, unspecified: Secondary | ICD-10-CM | POA: Diagnosis not present

## 2020-05-19 DIAGNOSIS — E1169 Type 2 diabetes mellitus with other specified complication: Secondary | ICD-10-CM | POA: Diagnosis not present

## 2020-05-19 LAB — BAYER DCA HB A1C WAIVED: HB A1C (BAYER DCA - WAIVED): 7 % — ABNORMAL HIGH (ref <7.0)

## 2020-05-20 ENCOUNTER — Ambulatory Visit (INDEPENDENT_AMBULATORY_CARE_PROVIDER_SITE_OTHER): Payer: Medicare HMO | Admitting: Family Medicine

## 2020-05-20 ENCOUNTER — Encounter: Payer: Self-pay | Admitting: Family Medicine

## 2020-05-20 VITALS — BP 121/68 | HR 63 | Temp 98.0°F | Ht 69.0 in | Wt 183.0 lb

## 2020-05-20 DIAGNOSIS — E785 Hyperlipidemia, unspecified: Secondary | ICD-10-CM | POA: Diagnosis not present

## 2020-05-20 DIAGNOSIS — I152 Hypertension secondary to endocrine disorders: Secondary | ICD-10-CM | POA: Diagnosis not present

## 2020-05-20 DIAGNOSIS — Z23 Encounter for immunization: Secondary | ICD-10-CM

## 2020-05-20 DIAGNOSIS — E1159 Type 2 diabetes mellitus with other circulatory complications: Secondary | ICD-10-CM | POA: Diagnosis not present

## 2020-05-20 DIAGNOSIS — E1169 Type 2 diabetes mellitus with other specified complication: Secondary | ICD-10-CM

## 2020-05-20 LAB — CMP14+EGFR
ALT: 28 IU/L (ref 0–44)
AST: 24 IU/L (ref 0–40)
Albumin/Globulin Ratio: 1.9 (ref 1.2–2.2)
Albumin: 4.7 g/dL (ref 3.8–4.8)
Alkaline Phosphatase: 43 IU/L — ABNORMAL LOW (ref 44–121)
BUN/Creatinine Ratio: 19 (ref 10–24)
BUN: 22 mg/dL (ref 8–27)
Bilirubin Total: <0.2 mg/dL (ref 0.0–1.2)
CO2: 27 mmol/L (ref 20–29)
Calcium: 10.2 mg/dL (ref 8.6–10.2)
Chloride: 100 mmol/L (ref 96–106)
Creatinine, Ser: 1.13 mg/dL (ref 0.76–1.27)
GFR calc Af Amer: 77 mL/min/{1.73_m2} (ref >59)
GFR calc non Af Amer: 67 mL/min/{1.73_m2} (ref >59)
Globulin, Total: 2.5 g/dL (ref 1.5–4.5)
Glucose: 114 mg/dL — ABNORMAL HIGH (ref 65–99)
Potassium: 3.8 mmol/L (ref 3.5–5.2)
Sodium: 142 mmol/L (ref 134–144)
Total Protein: 7.2 g/dL (ref 6.0–8.5)

## 2020-05-20 LAB — CBC WITH DIFFERENTIAL/PLATELET
Basophils Absolute: 0.1 10*3/uL (ref 0.0–0.2)
Basos: 1 %
EOS (ABSOLUTE): 0.4 10*3/uL (ref 0.0–0.4)
Eos: 5 %
Hematocrit: 44.7 % (ref 37.5–51.0)
Hemoglobin: 15 g/dL (ref 13.0–17.7)
Immature Grans (Abs): 0 10*3/uL (ref 0.0–0.1)
Immature Granulocytes: 0 %
Lymphocytes Absolute: 2.8 10*3/uL (ref 0.7–3.1)
Lymphs: 37 %
MCH: 30 pg (ref 26.6–33.0)
MCHC: 33.6 g/dL (ref 31.5–35.7)
MCV: 89 fL (ref 79–97)
Monocytes Absolute: 0.6 10*3/uL (ref 0.1–0.9)
Monocytes: 7 %
Neutrophils Absolute: 3.8 10*3/uL (ref 1.4–7.0)
Neutrophils: 50 %
Platelets: 253 10*3/uL (ref 150–450)
RBC: 5 x10E6/uL (ref 4.14–5.80)
RDW: 13.8 % (ref 11.6–15.4)
WBC: 7.6 10*3/uL (ref 3.4–10.8)

## 2020-05-20 LAB — LIPID PANEL
Chol/HDL Ratio: 3.9 ratio (ref 0.0–5.0)
Cholesterol, Total: 160 mg/dL (ref 100–199)
HDL: 41 mg/dL (ref >39)
LDL Chol Calc (NIH): 94 mg/dL (ref 0–99)
Triglycerides: 140 mg/dL (ref 0–149)
VLDL Cholesterol Cal: 25 mg/dL (ref 5–40)

## 2020-05-20 MED ORDER — ROSUVASTATIN CALCIUM 40 MG PO TABS: 40.0000 mg | ORAL_TABLET | Freq: Every day | ORAL | 0 refills | Status: DC

## 2020-05-20 MED ORDER — LISINOPRIL-HYDROCHLOROTHIAZIDE 20-12.5 MG PO TABS
2.0000 | ORAL_TABLET | Freq: Every day | ORAL | 0 refills | Status: DC
Start: 2020-05-20 — End: 2020-05-23

## 2020-05-20 MED ORDER — FENOFIBRATE 160 MG PO TABS: 160.0000 mg | ORAL_TABLET | Freq: Every day | ORAL | 0 refills | Status: DC

## 2020-05-20 NOTE — Progress Notes (Signed)
BP 121/68   Pulse 63   Temp 98 F (36.7 C)   Ht 5' 9"  (1.753 m)   Wt 183 lb (83 kg)   SpO2 96%   BMI 27.02 kg/m    Subjective:   Patient ID: Philip Huang, male    DOB: 1950/08/23, 69 y.o.   MRN: [id number]  HPI: Philip Huang is a 69 y.o. male presenting on 05/20/2020 for Medical Management of Chronic Issues and Diabetes   HPI Type 2 diabetes mellitus Patient comes in today for recheck of his diabetes. Patient has been currently taking glipizide and Metformin and Ozempic. Patient is currently on an ACE inhibitor/ARB. Patient has not seen an ophthalmologist this year. Patient denies any issues with their feet. The symptom started onset as an adult hyperlipidemia and hypertension ARE RELATED TO DM   Hyperlipidemia Patient is coming in for recheck of his hyperlipidemia. The patient is currently taking fish oil and fenofibrate and Crestor. They deny any issues with myalgias or history of liver damage from it. They deny any focal numbness or weakness or chest pain.   Hypertension Patient is currently on lisinopril hydrochlorothiazide, and their blood pressure today is 121/68. Patient denies any lightheadedness or dizziness. Patient denies headaches, blurred vision, chest pains, shortness of breath, or weakness. Denies any side effects from medication and is content with current medication.   Relevant past medical, surgical, family and social history reviewed and updated as indicated. Interim medical history since our last visit reviewed. Allergies and medications reviewed and updated.  Review of Systems  Constitutional: Negative for chills and fever.  Eyes: Negative for discharge.  Respiratory: Negative for shortness of breath and wheezing.   Cardiovascular: Negative for chest pain and leg swelling.  Musculoskeletal: Negative for back pain and gait problem.  Skin: Negative for rash.  Neurological: Negative for dizziness, seizures, weakness and numbness.  All  other systems reviewed and are negative.   Per HPI unless specifically indicated above   Allergies as of 05/20/2020      Reactions   Doxycycline    Sulfa Antibiotics Rash      Medication List       Accurate as of May 20, 2020 11:26 AM. If you have any questions, ask your nurse or doctor.        Accu-Chek Aviva Plus test strip Generic drug: glucose blood Test BS BID Dx E11.9   True Metrix Blood Glucose Test test strip Generic drug: glucose blood Test BS BID Dx E11.9   aspirin 81 MG tablet Take 81 mg by mouth daily.   B-D SINGLE USE SWABS REGULAR Pads Test BS BID Dx E11.9   blood glucose meter kit and supplies Kit Accucheck Meter. Check BS BID and prn Dx Code E11.9   fenofibrate 160 MG tablet TAKE 1 TABLET EVERY DAY   fish oil-omega-3 fatty acids 1000 MG capsule Take 1 g by mouth daily.   glipiZIDE 10 MG 24 hr tablet Commonly known as: GLUCOTROL XL Take 1 tablet (10 mg total) by mouth daily with breakfast.   Jardiance 25 MG Tabs tablet Generic drug: empagliflozin Take 25 mg by mouth daily before breakfast.   lisinopril-hydrochlorothiazide 20-12.5 MG tablet Commonly known as: ZESTORETIC TAKE 2 TABLETS EVERY DAY   MENS 50+ ADVANCED PO Take by mouth.   metFORMIN 1000 MG tablet Commonly known as: GLUCOPHAGE Take 1 tablet (1,000 mg total) by mouth 2 (two) times daily with a meal.   nitroGLYCERIN 0.4 MG SL tablet Commonly known  as: NITROSTAT Place 1 tablet (0.4 mg total) under the tongue every 5 (five) minutes as needed.   rosuvastatin 40 MG tablet Commonly known as: CRESTOR TAKE 1 TABLET EVERY DAY   Semaglutide 14 MG Tabs Take 14 mg by mouth daily.   True Metrix Air Glucose Meter w/Device Kit Test BS BID Dx E11.9   True Metrix Meter w/Device Kit USE AS DIRECTED   TRUEplus Lancets 33G Misc Test BS BID Dx E11.9   Vitamin D 50 MCG (2000 UT) tablet Take 2,000 Units by mouth daily.        Objective:   BP 121/68   Pulse 63   Temp 98  F (36.7 C)   Ht 5' 9"  (1.753 m)   Wt 183 lb (83 kg)   SpO2 96%   BMI 27.02 kg/m   Wt Readings from Last 3 Encounters:  05/20/20 183 lb (83 kg)  02/18/20 187 lb (84.8 kg)  11/19/19 190 lb 8 oz (86.4 kg)    Physical Exam Vitals and nursing note reviewed.  Constitutional:      General: He is not in acute distress.    Appearance: He is well-developed. He is not diaphoretic.  Eyes:     General: No scleral icterus.    Conjunctiva/sclera: Conjunctivae normal.  Neck:     Thyroid: No thyromegaly.  Cardiovascular:     Rate and Rhythm: Normal rate and regular rhythm.     Heart sounds: Normal heart sounds. No murmur heard.   Pulmonary:     Effort: Pulmonary effort is normal. No respiratory distress.     Breath sounds: Normal breath sounds. No wheezing.  Musculoskeletal:        General: Normal range of motion.     Cervical back: Neck supple.  Lymphadenopathy:     Cervical: No cervical adenopathy.  Skin:    General: Skin is warm and dry.     Findings: No rash.  Neurological:     Mental Status: He is alert and oriented to person, place, and time.     Coordination: Coordination normal.  Psychiatric:        Behavior: Behavior normal.     Results for orders placed or performed in visit on 05/19/20  Bayer DCA Hb A1c Waived  Result Value Ref Range   HB A1C (BAYER DCA - WAIVED) 7.0 (H) <7.0 %  CBC with Differential/Platelet  Result Value Ref Range   WBC 7.6 3.4 - 10.8 x10E3/uL   RBC 5.00 4.14 - 5.80 x10E6/uL   Hemoglobin 15.0 13.0 - 17.7 g/dL   Hematocrit 44.7 37.5 - 51.0 %   MCV 89 79 - 97 fL   MCH 30.0 26.6 - 33.0 pg   MCHC 33.6 31 - 35 g/dL   RDW 13.8 11.6 - 15.4 %   Platelets 253 150 - 450 x10E3/uL   Neutrophils 50 Not Estab. %   Lymphs 37 Not Estab. %   Monocytes 7 Not Estab. %   Eos 5 Not Estab. %   Basos 1 Not Estab. %   Neutrophils Absolute 3.8 1 - 7 x10E3/uL   Lymphocytes Absolute 2.8 0 - 3 x10E3/uL   Monocytes Absolute 0.6 0 - 0 x10E3/uL   EOS (ABSOLUTE) 0.4  0.0 - 0.4 x10E3/uL   Basophils Absolute 0.1 0 - 0 x10E3/uL   Immature Granulocytes 0 Not Estab. %   Immature Grans (Abs) 0.0 0.0 - 0.1 x10E3/uL  CMP14+EGFR  Result Value Ref Range   Glucose 114 (H) 65 - 99  mg/dL   BUN 22 8 - 27 mg/dL   Creatinine, Ser 1.13 0.76 - 1.27 mg/dL   GFR calc non Af Amer 67 >59 mL/min/1.73   GFR calc Af Amer 77 >59 mL/min/1.73   BUN/Creatinine Ratio 19 10 - 24   Sodium 142 134 - 144 mmol/L   Potassium 3.8 3.5 - 5.2 mmol/L   Chloride 100 96 - 106 mmol/L   CO2 27 20 - 29 mmol/L   Calcium 10.2 8.6 - 10.2 mg/dL   Total Protein 7.2 6.0 - 8.5 g/dL   Albumin 4.7 3.8 - 4.8 g/dL   Globulin, Total 2.5 1.5 - 4.5 g/dL   Albumin/Globulin Ratio 1.9 1.2 - 2.2   Bilirubin Total <0.2 0.0 - 1.2 mg/dL   Alkaline Phosphatase 43 (L) 44 - 121 IU/L   AST 24 0 - 40 IU/L   ALT 28 0 - 44 IU/L  Lipid panel  Result Value Ref Range   Cholesterol, Total 160 100 - 199 mg/dL   Triglycerides 140 0 - 149 mg/dL   HDL 41 >39 mg/dL   VLDL Cholesterol Cal 25 5 - 40 mg/dL   LDL Chol Calc (NIH) 94 0 - 99 mg/dL   Chol/HDL Ratio 3.9 0.0 - 5.0 ratio    Assessment & Plan:   Problem List Items Addressed This Visit      Cardiovascular and Mediastinum   Hypertension associated with diabetes ([place])   Relevant Medications   rosuvastatin (CRESTOR) 40 MG tablet   lisinopril-hydrochlorothiazide (ZESTORETIC) 20-12.5 MG tablet   fenofibrate 160 MG tablet     Endocrine   Type II diabetes mellitus (HCC) (Chronic)   Relevant Medications   rosuvastatin (CRESTOR) 40 MG tablet   lisinopril-hydrochlorothiazide (ZESTORETIC) 20-12.5 MG tablet   Hyperlipidemia associated with type 2 diabetes mellitus (HCC)   Relevant Medications   rosuvastatin (CRESTOR) 40 MG tablet   lisinopril-hydrochlorothiazide (ZESTORETIC) 20-12.5 MG tablet   fenofibrate 160 MG tablet    Other Visit Diagnoses    Flu vaccine need    -  Primary   Relevant Orders   Flu Vaccine QUAD High Dose(Fluad)   Vaccine for streptococcus  pneumoniae and influenza       Relevant Orders   Pneumococcal polysaccharide vaccine 23-valent greater than or equal to 2yo subcutaneous/IM    Patient's A1c looks good, appears to be going well, continue current medication, no changes.  Follow up plan: Return in about 3 months (around 08/20/2020), or if symptoms worsen or fail to improve, for Diabetes recheck.  Counseling provided for all of the vaccine components No orders of the defined types were placed in this encounter.   Caryl Pina, MD [facility] 05/20/2020, 11:26 AM

## 2020-05-23 MED ORDER — ROSUVASTATIN CALCIUM 40 MG PO TABS: 40.0000 mg | ORAL_TABLET | Freq: Every day | ORAL | 0 refills | Status: DC

## 2020-05-23 MED ORDER — LISINOPRIL-HYDROCHLOROTHIAZIDE 20-12.5 MG PO TABS: 2.0000 | ORAL_TABLET | Freq: Every day | ORAL | 0 refills | Status: DC

## 2020-05-23 MED ORDER — FENOFIBRATE 160 MG PO TABS: 160.0000 mg | ORAL_TABLET | Freq: Every day | ORAL | 0 refills | Status: DC

## 2020-05-23 NOTE — Progress Notes (Signed)
E-prescribe down. resent 

## 2020-05-30 ENCOUNTER — Telehealth: Payer: Self-pay | Admitting: Pharmacist

## 2020-05-30 NOTE — Telephone Encounter (Signed)
Patient approved for [employer] patient assistance (jardiance-free medication) for next year 08/2020-08/05/2021  Letter to be sent to patient to make him aware

## 2020-06-23 DIAGNOSIS — L814 Other melanin hyperpigmentation: Secondary | ICD-10-CM | POA: Diagnosis not present

## 2020-06-23 DIAGNOSIS — Z1283 Encounter for screening for malignant neoplasm of skin: Secondary | ICD-10-CM | POA: Diagnosis not present

## 2020-06-23 DIAGNOSIS — B353 Tinea pedis: Secondary | ICD-10-CM | POA: Diagnosis not present

## 2020-06-23 DIAGNOSIS — D0471 Carcinoma in situ of skin of right lower limb, including hip: Secondary | ICD-10-CM | POA: Diagnosis not present

## 2020-06-23 DIAGNOSIS — L821 Other seborrheic keratosis: Secondary | ICD-10-CM | POA: Diagnosis not present

## 2020-06-23 DIAGNOSIS — Z85820 Personal history of malignant melanoma of skin: Secondary | ICD-10-CM | POA: Diagnosis not present

## 2020-06-23 DIAGNOSIS — D225 Melanocytic nevi of trunk: Secondary | ICD-10-CM | POA: Diagnosis not present

## 2020-06-23 DIAGNOSIS — Z08 Encounter for follow-up examination after completed treatment for malignant neoplasm: Secondary | ICD-10-CM | POA: Diagnosis not present

## 2020-06-23 DIAGNOSIS — L820 Inflamed seborrheic keratosis: Secondary | ICD-10-CM | POA: Diagnosis not present

## 2020-07-08 ENCOUNTER — Telehealth: Payer: Self-pay

## 2020-07-08 NOTE — Telephone Encounter (Signed)
LMTCB

## 2020-07-08 NOTE — Telephone Encounter (Signed)
Please let patient know:  Call [phone] for refills on Rybelsus through patient assistance program.  They can direct her on how to get emergency supply if refills not received in the mail.  We do not have samples at this time.  Did she refill medication?

## 2020-07-08 NOTE — Telephone Encounter (Signed)
Pts wife called to let Almyra Free know that he is almost out of his Rebelsus 14mg  and needs it to be refilled soon.   Wants to know if we have any samples to give to patient until his refill comes in the mail.  Pt uses the medication assistance program.

## 2020-07-08 NOTE — Telephone Encounter (Signed)
Pts wife returned missed call. Reviewed Julies note with pts wife. Pts wife voiced understanding and said that she would call # to request refills.

## 2020-07-11 ENCOUNTER — Other Ambulatory Visit: Payer: Self-pay

## 2020-07-11 ENCOUNTER — Ambulatory Visit (INDEPENDENT_AMBULATORY_CARE_PROVIDER_SITE_OTHER): Payer: Medicare HMO | Admitting: Pharmacist

## 2020-07-11 DIAGNOSIS — E119 Type 2 diabetes mellitus without complications: Secondary | ICD-10-CM

## 2020-07-11 NOTE — Progress Notes (Signed)
    07/11/2020 Name: Duanne Duchesne MRN: [id number] DOB: August 01, 1951   S:  58 yoF Presents for diabetes evaluation, education, and management.  A1c has drastically improved with the addition of Rybelsus 14 mg daily.  His latest A1c was 7%. Patient was referred and last seen by Primary Care Provider on 02/18/20.  Insurance coverage/medication affordability: [employer] medic[employer] gold  Patient reports adherence with medications. . Current diabetes medications include: rybelsus, metformin, jardiance, glipizide . Current hypertension medications include: lisinopril/hctz Goal 130/80 . Current hyperlipidemia medications include: rosuvastatin   Patient denies hypoglycemic events.   O:  Lab Results  Component Value Date   HGBA1C 7.0 (H) 05/19/2020   Lipid Panel     Component Value Date/Time   CHOL 160 05/19/2020 1516   CHOL 168 02/02/2013 0843   TRIG 140 05/19/2020 1516   TRIG 209 (H) 10/22/2014 0810   TRIG 164 (H) 02/02/2013 0843   HDL 41 05/19/2020 1516   HDL 36 (L) 10/22/2014 0810   HDL 43 02/02/2013 0843   CHOLHDL 3.9 05/19/2020 1516   LDLCALC 94 05/19/2020 1516   LDLCALC 100 (H) 03/12/2014 1600   LDLCALC 92 02/02/2013 0843    Home fasting blood sugars: <130  2 hour post-meal/random blood sugars: <180.   A/P: Diabetes longstanding currentlyuncontrolled. Patient is able to verbalize appropriate hypoglycemia management plan.Wife is very involved in his care.Patientisadherent with medication.   -CONTINUE GLP-1 Rybelsus(generic name: semaglutide)14mg  daily.  He is tolerating Rybelsus 14mg  daily well; denies side effects  Re-applied for Novo Nordisk patient assistance  Medication to ship to PCP office (4 month supply) automatic refills; wife has phone number to call if issues  -ContinueSGLT2-I Jardiance(generic name: empagliflozin)25mg  daily  -Continue metformin & glipizide for now  -Extensively discussed pathophysiology of diabetes, recommended  lifestyle interventions, dietary effects on blood sugar control  -Counseled on s/sx of and management of hypoglycemia  -Next A1C anticipated 08/22/20  Written patient instructions provided.  Total time in face to face counseling 30 minutes.   Follow up PCP Clinic Visit ON 08/22/20.    Regina Eck, PharmD, BCPS Clinical Pharmacist, [place]  [facility] Phone [phone]

## 2020-07-20 ENCOUNTER — Telehealth: Payer: Self-pay

## 2020-07-20 NOTE — Telephone Encounter (Signed)
Rybelsus samples left up front for patient [id number] EXP 6/23

## 2020-07-31 ENCOUNTER — Other Ambulatory Visit: Payer: Self-pay | Admitting: Family Medicine

## 2020-07-31 DIAGNOSIS — E1169 Type 2 diabetes mellitus with other specified complication: Secondary | ICD-10-CM

## 2020-07-31 DIAGNOSIS — E785 Hyperlipidemia, unspecified: Secondary | ICD-10-CM

## 2020-08-04 ENCOUNTER — Telehealth: Payer: Self-pay

## 2020-08-04 NOTE — Telephone Encounter (Signed)
  Please let patient know  rybelsus samples left up front for patient  [id number] Exp 6/23

## 2020-08-16 DIAGNOSIS — Z135 Encounter for screening for eye and ear disorders: Secondary | ICD-10-CM | POA: Diagnosis not present

## 2020-08-16 DIAGNOSIS — Z961 Presence of intraocular lens: Secondary | ICD-10-CM | POA: Diagnosis not present

## 2020-08-16 DIAGNOSIS — H52209 Unspecified astigmatism, unspecified eye: Secondary | ICD-10-CM | POA: Diagnosis not present

## 2020-08-16 DIAGNOSIS — H40053 Ocular hypertension, bilateral: Secondary | ICD-10-CM | POA: Diagnosis not present

## 2020-08-16 DIAGNOSIS — E113292 Type 2 diabetes mellitus with mild nonproliferative diabetic retinopathy without macular edema, left eye: Secondary | ICD-10-CM | POA: Diagnosis not present

## 2020-08-16 LAB — HM DIABETES EYE EXAM

## 2020-08-18 ENCOUNTER — Telehealth: Payer: Self-pay

## 2020-08-18 NOTE — Telephone Encounter (Signed)
What labs would you liked ordered?

## 2020-08-22 ENCOUNTER — Other Ambulatory Visit: Payer: Self-pay

## 2020-08-22 ENCOUNTER — Other Ambulatory Visit: Payer: Self-pay | Admitting: Family Medicine

## 2020-08-22 ENCOUNTER — Encounter: Payer: Self-pay | Admitting: Family Medicine

## 2020-08-22 ENCOUNTER — Ambulatory Visit (INDEPENDENT_AMBULATORY_CARE_PROVIDER_SITE_OTHER): Payer: Medicare HMO | Admitting: Family Medicine

## 2020-08-22 DIAGNOSIS — E785 Hyperlipidemia, unspecified: Secondary | ICD-10-CM

## 2020-08-22 DIAGNOSIS — I152 Hypertension secondary to endocrine disorders: Secondary | ICD-10-CM | POA: Diagnosis not present

## 2020-08-22 DIAGNOSIS — E1159 Type 2 diabetes mellitus with other circulatory complications: Secondary | ICD-10-CM

## 2020-08-22 DIAGNOSIS — E1169 Type 2 diabetes mellitus with other specified complication: Secondary | ICD-10-CM

## 2020-08-22 MED ORDER — FENOFIBRATE 160 MG PO TABS: 160.0000 mg | ORAL_TABLET | Freq: Every day | ORAL | 0 refills | Status: DC

## 2020-08-22 MED ORDER — ROSUVASTATIN CALCIUM 40 MG PO TABS: 40.0000 mg | ORAL_TABLET | Freq: Every day | ORAL | 0 refills | Status: DC

## 2020-08-22 NOTE — Progress Notes (Signed)
Virtual Visit via telephone Note  I connected with Philip Huang on 08/22/20 at [phone] by telephone and verified that I am speaking with the correct person using two identifiers. Philip Huang is currently located at home and Wife are currently with her during visit. The provider, Fransisca Kaufmann Britten Seyfried, MD is located in their office at time of visit.  Call ended at [phone]  I discussed the limitations, risks, security and privacy concerns of performing an evaluation and management service by telephone and the availability of in person appointments. I also discussed with the patient that there may be a patient responsible charge related to this service. The patient expressed understanding and agreed to proceed.   History and Present Illness: Type 2 diabetes mellitus Patient comes in today for recheck of his diabetes. Patient has been currently taking glipizide and metformin and semaglutide and jardiance. Patient is currently on an ACE inhibitor/ARB. Patient has seen an ophthalmologist this year. Patient denies any issues with their feet. The symptom started onset as an adult hld and htn ARE RELATED TO DM   Hyperlipidemia Patient is coming in for recheck of his hyperlipidemia. The patient is currently taking crestor and fenofibrate. They deny any issues with myalgias or history of liver damage from it. They deny any focal numbness or weakness or chest pain.   Hypertension Patient is currently on lisinopril and hctz, and their blood pressure today is 120/64. Patient denies any lightheadedness or dizziness. Patient denies headaches, blurred vision, chest pains, shortness of breath, or weakness. Denies any side effects from medication and is content with current medication.   1. Type 2 diabetes mellitus with other specified complication, without long-term current use of insulin ([place])   2. Hypertension associated with diabetes ([place])   3. Hyperlipidemia associated with type 2 diabetes  [facility])     Outpatient Encounter Medications as of 08/22/2020  Medication Sig  . Alcohol Swabs (B-D SINGLE USE SWABS REGULAR) PADS USE AS DIRECTED TWICE DAILY Dx E11.69  . aspirin 81 MG tablet Take 81 mg by mouth daily.   . blood glucose meter kit and supplies KIT Accucheck Meter. Check BS BID and prn Dx Code E11.9  . Blood Glucose Monitoring Suppl (TRUE METRIX AIR GLUCOSE METER) w/Device KIT Test BS BID Dx E11.9  . Blood Glucose Monitoring Suppl (TRUE METRIX METER) w/Device KIT USE AS DIRECTED  . Cholecalciferol (VITAMIN D) 2000 UNITS tablet Take 2,000 Units by mouth daily.    . empagliflozin (JARDIANCE) 25 MG TABS tablet Take 25 mg by mouth daily before breakfast.  . fenofibrate 160 MG tablet Take 1 tablet (160 mg total) by mouth daily.  . fish oil-omega-3 fatty acids 1000 MG capsule Take 1 g by mouth daily.  Marland Kitchen glipiZIDE (GLUCOTROL XL) 10 MG 24 hr tablet TAKE 1 TABLET EVERY DAY WITH BREAKFAST  . glucose blood (ACCU-CHEK AVIVA PLUS) test strip Test BS BID Dx E11.9  . glucose blood (TRUE METRIX BLOOD GLUCOSE TEST) test strip Test BS BID Dx E11.9  . lisinopril-hydrochlorothiazide (ZESTORETIC) 20-12.5 MG tablet TAKE 2 TABLETS EVERY DAY  . metFORMIN (GLUCOPHAGE) 1000 MG tablet Take 1 tablet (1,000 mg total) by mouth 2 (two) times daily with a meal.  . Multiple Vitamins-Minerals (MENS 50+ ADVANCED PO) Take by mouth.    . nitroGLYCERIN (NITROSTAT) 0.4 MG SL tablet Place 1 tablet (0.4 mg total) under the tongue every 5 (five) minutes as needed.  . rosuvastatin (CRESTOR) 40 MG tablet Take 1 tablet (40 mg total)  by mouth daily.  . Semaglutide 14 MG TABS Take 14 mg by mouth daily.  . TRUEplus Lancets 33G MISC Test BS BID Dx E11.9  . [DISCONTINUED] fenofibrate 160 MG tablet TAKE 1 TABLET (160 MG TOTAL) BY MOUTH DAILY.  . [DISCONTINUED] rosuvastatin (CRESTOR) 40 MG tablet TAKE 1 TABLET (40 MG TOTAL) BY MOUTH DAILY.   No facility-administered encounter medications on file as of 08/22/2020.     Review of Systems  Constitutional: Negative for chills and fever.  Eyes: Negative for visual disturbance.  Respiratory: Negative for shortness of breath and wheezing.   Cardiovascular: Negative for chest pain and leg swelling.  Musculoskeletal: Negative for back pain and gait problem.  Skin: Negative for rash.  Neurological: Negative for dizziness, weakness and numbness.  All other systems reviewed and are negative.   Observations/Objective: Patient sounds comfortable and in no acute distress  Assessment and Plan: Problem List Items Addressed This Visit      Cardiovascular and Mediastinum   Hypertension associated with diabetes ([place])   Relevant Medications   rosuvastatin (CRESTOR) 40 MG tablet   fenofibrate 160 MG tablet   Other Relevant Orders   CMP14+EGFR     Endocrine   Type II diabetes mellitus (HCC) - Primary (Chronic)   Relevant Medications   rosuvastatin (CRESTOR) 40 MG tablet   Other Relevant Orders   Bayer DCA Hb A1c Waived   Hyperlipidemia associated with type 2 diabetes mellitus (HCC)   Relevant Medications   rosuvastatin (CRESTOR) 40 MG tablet   fenofibrate 160 MG tablet   Other Relevant Orders   Lipid panel      Patient sounds like his blood sugar numbers are doing okay at home, some running up to the 200s but mostly in the 130-150 range, he will come in and do some blood work Follow up plan: Return in about 3 months (around 11/20/2020), or if symptoms worsen or fail to improve, for dm and hld.     I discussed the assessment and treatment plan with the patient. The patient was provided an opportunity to ask questions and all were answered. The patient agreed with the plan and demonstrated an understanding of the instructions.   The patient was advised to call back or seek an in-person evaluation if the symptoms worsen or if the condition fails to improve as anticipated.  The above assessment and management plan was discussed with the patient. The  patient verbalized understanding of and has agreed to the management plan. Patient is aware to call the clinic if symptoms persist or worsen. Patient is aware when to return to the clinic for a follow-up visit. Patient educated on when it is appropriate to go to the emergency department.    I provided 11 minutes of non-face-to-face time during this encounter.    Worthy Rancher, MD

## 2020-08-25 ENCOUNTER — Other Ambulatory Visit: Payer: Medicare HMO

## 2020-08-25 ENCOUNTER — Other Ambulatory Visit: Payer: Self-pay

## 2020-08-25 DIAGNOSIS — E1159 Type 2 diabetes mellitus with other circulatory complications: Secondary | ICD-10-CM | POA: Diagnosis not present

## 2020-08-25 DIAGNOSIS — E785 Hyperlipidemia, unspecified: Secondary | ICD-10-CM | POA: Diagnosis not present

## 2020-08-25 DIAGNOSIS — I152 Hypertension secondary to endocrine disorders: Secondary | ICD-10-CM

## 2020-08-25 DIAGNOSIS — E1169 Type 2 diabetes mellitus with other specified complication: Secondary | ICD-10-CM

## 2020-08-25 LAB — CMP14+EGFR
ALT: 25 IU/L (ref 0–44)
AST: 24 IU/L (ref 0–40)
Albumin/Globulin Ratio: 1.7 (ref 1.2–2.2)
Albumin: 4.7 g/dL (ref 3.8–4.8)
Alkaline Phosphatase: 45 IU/L (ref 44–121)
BUN/Creatinine Ratio: 20 (ref 10–24)
BUN: 26 mg/dL (ref 8–27)
Bilirubin Total: 0.3 mg/dL (ref 0.0–1.2)
CO2: 25 mmol/L (ref 20–29)
Calcium: 10 mg/dL (ref 8.6–10.2)
Chloride: 101 mmol/L (ref 96–106)
Creatinine, Ser: 1.33 mg/dL — ABNORMAL HIGH (ref 0.76–1.27)
GFR calc Af Amer: 63 mL/min/{1.73_m2} (ref >59)
GFR calc non Af Amer: 55 mL/min/{1.73_m2} — ABNORMAL LOW (ref >59)
Globulin, Total: 2.8 g/dL (ref 1.5–4.5)
Glucose: 104 mg/dL — ABNORMAL HIGH (ref 65–99)
Potassium: 3.9 mmol/L (ref 3.5–5.2)
Sodium: 142 mmol/L (ref 134–144)
Total Protein: 7.5 g/dL (ref 6.0–8.5)

## 2020-08-25 LAB — LIPID PANEL
Chol/HDL Ratio: 3.9 ratio (ref 0.0–5.0)
Cholesterol, Total: 159 mg/dL (ref 100–199)
HDL: 41 mg/dL (ref >39)
LDL Chol Calc (NIH): 95 mg/dL (ref 0–99)
Triglycerides: 127 mg/dL (ref 0–149)
VLDL Cholesterol Cal: 23 mg/dL (ref 5–40)

## 2020-08-25 LAB — BAYER DCA HB A1C WAIVED: HB A1C (BAYER DCA - WAIVED): 6.9 % (ref <7.0)

## 2020-09-01 ENCOUNTER — Telehealth: Payer: Self-pay

## 2020-09-01 NOTE — Telephone Encounter (Signed)
Wife said she got in touch with pharmacy and we can cancel note

## 2020-09-05 ENCOUNTER — Telehealth: Payer: Self-pay

## 2020-09-05 NOTE — Telephone Encounter (Signed)
Pts wife called stating that pt took his last Rybelsus and needs more samples.

## 2020-09-05 NOTE — Telephone Encounter (Signed)
Pt aware - samples up front  

## 2020-09-08 ENCOUNTER — Telehealth: Payer: Self-pay | Admitting: *Deleted

## 2020-09-08 NOTE — Telephone Encounter (Signed)
Patient assistance Rybelsus 14 mg ready for pick up.  Wife aware

## 2020-09-19 ENCOUNTER — Telehealth: Payer: Self-pay

## 2020-09-19 DIAGNOSIS — D225 Melanocytic nevi of trunk: Secondary | ICD-10-CM | POA: Diagnosis not present

## 2020-09-19 DIAGNOSIS — Z85828 Personal history of other malignant neoplasm of skin: Secondary | ICD-10-CM | POA: Diagnosis not present

## 2020-09-19 DIAGNOSIS — Z1283 Encounter for screening for malignant neoplasm of skin: Secondary | ICD-10-CM | POA: Diagnosis not present

## 2020-09-19 DIAGNOSIS — Z08 Encounter for follow-up examination after completed treatment for malignant neoplasm: Secondary | ICD-10-CM | POA: Diagnosis not present

## 2020-09-19 DIAGNOSIS — Z85820 Personal history of malignant melanoma of skin: Secondary | ICD-10-CM | POA: Diagnosis not present

## 2020-09-19 NOTE — Telephone Encounter (Signed)
Informed pts wife that he may try Omeprazole OTC daily. Avoid spicy foods, foods with red sauces, coffee, chocolate. Do not eat late at night and sleep with head elevated.

## 2020-09-21 ENCOUNTER — Ambulatory Visit: Payer: Medicare HMO

## 2020-10-14 ENCOUNTER — Other Ambulatory Visit: Payer: Self-pay | Admitting: Family Medicine

## 2020-10-14 DIAGNOSIS — E785 Hyperlipidemia, unspecified: Secondary | ICD-10-CM

## 2020-10-14 DIAGNOSIS — E1169 Type 2 diabetes mellitus with other specified complication: Secondary | ICD-10-CM

## 2020-10-25 ENCOUNTER — Other Ambulatory Visit: Payer: Self-pay | Admitting: Family Medicine

## 2020-10-25 DIAGNOSIS — E1169 Type 2 diabetes mellitus with other specified complication: Secondary | ICD-10-CM

## 2020-10-25 DIAGNOSIS — E785 Hyperlipidemia, unspecified: Secondary | ICD-10-CM

## 2020-11-25 ENCOUNTER — Telehealth: Payer: Self-pay

## 2020-11-25 DIAGNOSIS — E1169 Type 2 diabetes mellitus with other specified complication: Secondary | ICD-10-CM

## 2020-11-29 NOTE — Telephone Encounter (Signed)
Pt aware.

## 2020-11-29 NOTE — Telephone Encounter (Signed)
Placed labs for patient ?

## 2020-11-30 ENCOUNTER — Other Ambulatory Visit: Payer: Self-pay

## 2020-11-30 ENCOUNTER — Other Ambulatory Visit: Payer: Medicare HMO

## 2020-11-30 DIAGNOSIS — E1169 Type 2 diabetes mellitus with other specified complication: Secondary | ICD-10-CM | POA: Diagnosis not present

## 2020-11-30 LAB — CMP14+EGFR
ALT: 31 IU/L (ref 0–44)
AST: 29 IU/L (ref 0–40)
Albumin/Globulin Ratio: 1.7 (ref 1.2–2.2)
Albumin: 4.7 g/dL (ref 3.8–4.8)
Alkaline Phosphatase: 43 IU/L — ABNORMAL LOW (ref 44–121)
BUN/Creatinine Ratio: 17 (ref 10–24)
BUN: 23 mg/dL (ref 8–27)
Bilirubin Total: 0.4 mg/dL (ref 0.0–1.2)
CO2: 25 mmol/L (ref 20–29)
Calcium: 9.9 mg/dL (ref 8.6–10.2)
Chloride: 99 mmol/L (ref 96–106)
Creatinine, Ser: 1.38 mg/dL — ABNORMAL HIGH (ref 0.76–1.27)
Globulin, Total: 2.8 g/dL (ref 1.5–4.5)
Glucose: 135 mg/dL — ABNORMAL HIGH (ref 65–99)
Potassium: 4.6 mmol/L (ref 3.5–5.2)
Sodium: 142 mmol/L (ref 134–144)
Total Protein: 7.5 g/dL (ref 6.0–8.5)
eGFR: 56 mL/min/{1.73_m2} — ABNORMAL LOW (ref >59)

## 2020-11-30 LAB — BAYER DCA HB A1C WAIVED: HB A1C (BAYER DCA - WAIVED): 7.6 % — ABNORMAL HIGH (ref <7.0)

## 2020-12-01 ENCOUNTER — Ambulatory Visit (INDEPENDENT_AMBULATORY_CARE_PROVIDER_SITE_OTHER): Payer: Medicare HMO | Admitting: Family Medicine

## 2020-12-01 ENCOUNTER — Encounter: Payer: Self-pay | Admitting: Family Medicine

## 2020-12-01 VITALS — BP 139/67 | HR 67 | Ht 69.0 in | Wt 186.0 lb

## 2020-12-01 DIAGNOSIS — E1169 Type 2 diabetes mellitus with other specified complication: Secondary | ICD-10-CM | POA: Diagnosis not present

## 2020-12-01 DIAGNOSIS — E1159 Type 2 diabetes mellitus with other circulatory complications: Secondary | ICD-10-CM | POA: Diagnosis not present

## 2020-12-01 DIAGNOSIS — E785 Hyperlipidemia, unspecified: Secondary | ICD-10-CM

## 2020-12-01 DIAGNOSIS — H6121 Impacted cerumen, right ear: Secondary | ICD-10-CM | POA: Diagnosis not present

## 2020-12-01 DIAGNOSIS — I152 Hypertension secondary to endocrine disorders: Secondary | ICD-10-CM

## 2020-12-01 MED ORDER — GLIPIZIDE ER 10 MG PO TB24: 10.0000 mg | ORAL_TABLET | Freq: Every day | ORAL | 3 refills | Status: DC

## 2020-12-01 MED ORDER — ROSUVASTATIN CALCIUM 40 MG PO TABS: 40.0000 mg | ORAL_TABLET | Freq: Every day | ORAL | 3 refills | Status: DC

## 2020-12-01 MED ORDER — EMPAGLIFLOZIN 25 MG PO TABS: 25.0000 mg | ORAL_TABLET | Freq: Every day | ORAL | 3 refills | Status: DC

## 2020-12-01 MED ORDER — FENOFIBRATE 160 MG PO TABS: 160.0000 mg | ORAL_TABLET | Freq: Every day | ORAL | 3 refills | Status: DC

## 2020-12-01 MED ORDER — LISINOPRIL-HYDROCHLOROTHIAZIDE 20-12.5 MG PO TABS: 2.0000 | ORAL_TABLET | Freq: Every day | ORAL | 3 refills | Status: DC

## 2020-12-01 MED ORDER — METFORMIN HCL 1000 MG PO TABS: 1.0000 | ORAL_TABLET | Freq: Two times a day (BID) | ORAL | 3 refills | Status: DC

## 2020-12-01 NOTE — Progress Notes (Signed)
BP 139/67   Pulse 67   Ht 5' 9"  (1.753 m)   Wt 186 lb (84.4 kg)   SpO2 97%   BMI 27.47 kg/m    Subjective:   Patient ID: Philip Huang, male    DOB: 02-Dec-1950, 70 y.o.   MRN: [id number]  HPI: Philip Huang is a 70 y.o. male presenting on 12/01/2020 for Medical Management of Chronic Issues and Diabetes   HPI Type 2 diabetes mellitus Patient comes in today for recheck of his diabetes. Patient has been currently taking Rybelsus and Jardiance and glipizide and metformin. Patient is currently on an ACE inhibitor/ARB. Patient has not seen an ophthalmologist this year. Patient denies any issues with their feet. The symptom started onset as an adult hypertension and hyperlipidemia ARE RELATED TO DM   Hypertension Patient is currently on lisinopril hydrochlorothiazide, and their blood pressure today is 139/67. Patient denies any lightheadedness or dizziness. Patient denies headaches, blurred vision, chest pains, shortness of breath, or weakness. Denies any side effects from medication and is content with current medication.   Hyperlipidemia Patient is coming in for recheck of his hyperlipidemia. The patient is currently taking fenofibrate and Crestor. They deny any issues with myalgias or history of liver damage from it. They deny any focal numbness or weakness or chest pain.   Hearing loss right ear, thinks he has wax.  No pain or drainage noted.  Relevant past medical, surgical, family and social history reviewed and updated as indicated. Interim medical history since our last visit reviewed. Allergies and medications reviewed and updated.  Review of Systems  Constitutional: Negative for chills and fever.  HENT: Positive for hearing loss.   Eyes: Negative for discharge.  Respiratory: Negative for shortness of breath and wheezing.   Cardiovascular: Negative for chest pain and leg swelling.  Musculoskeletal: Negative for back pain and gait problem.  Skin: Negative for rash.   Neurological: Negative for dizziness, weakness, light-headedness and numbness.  All other systems reviewed and are negative.   Per HPI unless specifically indicated above   Allergies as of 12/01/2020      Reactions   Doxycycline    Sulfa Antibiotics Rash      Medication List       Accurate as of December 01, 2020  8:06 AM. If you have any questions, ask your nurse or doctor.        Accu-Chek Aviva Plus test strip Generic drug: glucose blood TEST BLOOD SUGAR TWICE DAILY   Accu-Chek Softclix Lancets lancets TEST BLOOD SUGAR TWICE DAILY Dx E11.9   TRUEplus Lancets 33G Misc TEST BLOOD SUGAR TWICE DAILY Dx E11.9   aspirin 81 MG tablet Take 81 mg by mouth daily.   B-D SINGLE USE SWABS REGULAR Pads USE AS DIRECTED TWICE DAILY Dx E11.69   blood glucose meter kit and supplies Kit Accucheck Meter. Check BS BID and prn Dx Code E11.9   fenofibrate 160 MG tablet TAKE 1 TABLET EVERY DAY   fish oil-omega-3 fatty acids 1000 MG capsule Take 1 g by mouth daily.   glipiZIDE 10 MG 24 hr tablet Commonly known as: GLUCOTROL XL TAKE 1 TABLET EVERY DAY WITH BREAKFAST   Jardiance 25 MG Tabs tablet Generic drug: empagliflozin Take 25 mg by mouth daily before breakfast.   lisinopril-hydrochlorothiazide 20-12.5 MG tablet Commonly known as: ZESTORETIC TAKE 2 TABLETS EVERY DAY   MENS 50+ ADVANCED PO Take by mouth.   metFORMIN 1000 MG tablet Commonly known as: GLUCOPHAGE TAKE 1 TABLET  TWICE DAILY WITH MEALS   nitroGLYCERIN 0.4 MG SL tablet Commonly known as: NITROSTAT Place 1 tablet (0.4 mg total) under the tongue every 5 (five) minutes as needed.   rosuvastatin 40 MG tablet Commonly known as: CRESTOR TAKE 1 TABLET EVERY DAY   Semaglutide 14 MG Tabs Take 14 mg by mouth daily.   True Metrix Air Glucose Meter w/Device Kit Test BS BID Dx E11.9   True Metrix Meter w/Device Kit USE AS DIRECTED   Vitamin D 50 MCG (2000 UT) tablet Take 2,000 Units by mouth daily.         Objective:   BP 139/67   Pulse 67   Ht 5' 9"  (1.753 m)   Wt 186 lb (84.4 kg)   SpO2 97%   BMI 27.47 kg/m   Wt Readings from Last 3 Encounters:  12/01/20 186 lb (84.4 kg)  05/20/20 183 lb (83 kg)  02/18/20 187 lb (84.8 kg)    Physical Exam Vitals and nursing note reviewed.  Constitutional:      General: He is not in acute distress.    Appearance: He is well-developed. He is not diaphoretic.  Eyes:     General: No scleral icterus.    Conjunctiva/sclera: Conjunctivae normal.  Neck:     Thyroid: No thyromegaly.  Cardiovascular:     Rate and Rhythm: Normal rate and regular rhythm.     Heart sounds: Normal heart sounds. No murmur heard.   Pulmonary:     Effort: Pulmonary effort is normal. No respiratory distress.     Breath sounds: Normal breath sounds. No wheezing.  Musculoskeletal:        General: Normal range of motion.     Cervical back: Neck supple.  Lymphadenopathy:     Cervical: No cervical adenopathy.  Skin:    General: Skin is warm and dry.     Findings: No rash.  Neurological:     Mental Status: He is alert and oriented to person, place, and time.     Coordination: Coordination normal.  Psychiatric:        Behavior: Behavior normal.     Results for orders placed or performed in visit on 11/30/20  Bayer DCA Hb A1c Waived  Result Value Ref Range   HB A1C (BAYER DCA - WAIVED) 7.6 (H) <7.0 %  CMP14+EGFR  Result Value Ref Range   Glucose 135 (H) 65 - 99 mg/dL   BUN 23 8 - 27 mg/dL   Creatinine, Ser 1.38 (H) 0.76 - 1.27 mg/dL   eGFR 56 (L) >59 mL/min/1.73   BUN/Creatinine Ratio 17 10 - 24   Sodium 142 134 - 144 mmol/L   Potassium 4.6 3.5 - 5.2 mmol/L   Chloride 99 96 - 106 mmol/L   CO2 25 20 - 29 mmol/L   Calcium 9.9 8.6 - 10.2 mg/dL   Total Protein 7.5 6.0 - 8.5 g/dL   Albumin 4.7 3.8 - 4.8 g/dL   Globulin, Total 2.8 1.5 - 4.5 g/dL   Albumin/Globulin Ratio 1.7 1.2 - 2.2   Bilirubin Total 0.4 0.0 - 1.2 mg/dL   Alkaline Phosphatase 43 (L) 44 -  121 IU/L   AST 29 0 - 40 IU/L   ALT 31 0 - 44 IU/L    Assessment & Plan:   Problem List Items Addressed This Visit      Cardiovascular and Mediastinum   Hypertension associated with diabetes ([place])   Relevant Medications   lisinopril-hydrochlorothiazide (ZESTORETIC) 20-12.5 MG tablet   glipiZIDE (  GLUCOTROL XL) 10 MG 24 hr tablet   fenofibrate 160 MG tablet   empagliflozin (JARDIANCE) 25 MG TABS tablet   metFORMIN (GLUCOPHAGE) 1000 MG tablet   rosuvastatin (CRESTOR) 40 MG tablet     Endocrine   Type II diabetes mellitus (HCC) - Primary (Chronic)   Relevant Medications   lisinopril-hydrochlorothiazide (ZESTORETIC) 20-12.5 MG tablet   glipiZIDE (GLUCOTROL XL) 10 MG 24 hr tablet   empagliflozin (JARDIANCE) 25 MG TABS tablet   metFORMIN (GLUCOPHAGE) 1000 MG tablet   rosuvastatin (CRESTOR) 40 MG tablet   Hyperlipidemia associated with type 2 diabetes mellitus (HCC)   Relevant Medications   lisinopril-hydrochlorothiazide (ZESTORETIC) 20-12.5 MG tablet   glipiZIDE (GLUCOTROL XL) 10 MG 24 hr tablet   fenofibrate 160 MG tablet   empagliflozin (JARDIANCE) 25 MG TABS tablet   metFORMIN (GLUCOPHAGE) 1000 MG tablet   rosuvastatin (CRESTOR) 40 MG tablet    Other Visit Diagnoses    Hearing loss due to cerumen impaction, right          Blood sugars up, wants to try diet and exercise, renal function still of the but stable, recommended increasing fluid hydration, already on ACE inhibitor.  Nurse to lavage ears, patient tolerated well. Follow up plan: Return in about 3 months (around 03/02/2021), or if symptoms worsen or fail to improve, for Diabetes hypertension cholesterol recheck.  Counseling provided for all of the vaccine components No orders of the defined types were placed in this encounter.   Caryl Pina, MD [facility] 12/01/2020, 8:06 AM

## 2020-12-07 ENCOUNTER — Telehealth: Payer: Self-pay

## 2020-12-07 NOTE — Telephone Encounter (Signed)
Refill request sent to [employer] patient assistance program Expect refill to ship in 3-4 weeks to PCP office Pt notified

## 2020-12-07 NOTE — Telephone Encounter (Signed)
Spouse called--Pt open last box of rybelsus and needs a refill. Wants to let Almyra Free know so that she can order pt more.

## 2020-12-20 DIAGNOSIS — L570 Actinic keratosis: Secondary | ICD-10-CM | POA: Diagnosis not present

## 2020-12-20 DIAGNOSIS — B354 Tinea corporis: Secondary | ICD-10-CM | POA: Diagnosis not present

## 2020-12-20 DIAGNOSIS — L814 Other melanin hyperpigmentation: Secondary | ICD-10-CM | POA: Diagnosis not present

## 2020-12-20 DIAGNOSIS — D1801 Hemangioma of skin and subcutaneous tissue: Secondary | ICD-10-CM | POA: Diagnosis not present

## 2020-12-20 DIAGNOSIS — L821 Other seborrheic keratosis: Secondary | ICD-10-CM | POA: Diagnosis not present

## 2020-12-20 DIAGNOSIS — L579 Skin changes due to chronic exposure to nonionizing radiation, unspecified: Secondary | ICD-10-CM | POA: Diagnosis not present

## 2020-12-20 DIAGNOSIS — B351 Tinea unguium: Secondary | ICD-10-CM | POA: Diagnosis not present

## 2020-12-20 DIAGNOSIS — Z85820 Personal history of malignant melanoma of skin: Secondary | ICD-10-CM | POA: Diagnosis not present

## 2020-12-21 NOTE — Telephone Encounter (Signed)
Refill request sent to [employer] patient assistance program Expect refill to ship in 3-4 weeks to PCP office Pt notified  

## 2020-12-27 ENCOUNTER — Ambulatory Visit (INDEPENDENT_AMBULATORY_CARE_PROVIDER_SITE_OTHER): Payer: Medicare HMO

## 2020-12-27 DIAGNOSIS — Z0000 Encounter for general adult medical examination without abnormal findings: Secondary | ICD-10-CM | POA: Diagnosis not present

## 2020-12-27 NOTE — Progress Notes (Signed)
MEDICARE ANNUAL WELLNESS VISIT  12/27/2020  Telephone Visit Disclaimer This Medicare AWV was conducted by telephone due to national recommendations for restrictions regarding the COVID-19 Pandemic (e.g. social distancing).  I verified, using two identifiers, that I am speaking with Philip Huang or their authorized healthcare agent. I discussed the limitations, risks, security, and privacy concerns of performing an evaluation and management service by telephone and the potential availability of an in-person appointment in the future. The patient expressed understanding and agreed to proceed.  Location of Patient: Home Location of Provider (nurse):  [facility]  Subjective:    Philip Huang is a 70 y.o. male patient of [name], Fransisca Kaufmann, MD who had a Medicare Annual Wellness Visit today via telephone. Philip Huang is currently retired and worked at [employer]. 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 He lives in nearby [facility] and is married with 2 children. It is just him and his wife that live in the home. He does not have a regular exercise routine but does stay very active while working. He is very pleasant.   Patient Care Team: [name], Fransisca Kaufmann, MD as PCP - General (Family Medicine) Lavera Guise, [facility] (Pharmacist)  Advanced Directives 12/27/2020 05/05/2019 04/11/2018  Does Patient Have a Medical Advance Directive? Yes Yes Yes  Type of Paramedic of [place];Living will Healthcare Power of [place]  Does patient want to make changes to medical advance directive? No - Guardian declined No - Patient declined -  Copy of [place] in Chart? No - copy requested No - copy requested No - copy requested  Would patient like information on creating a medical advance directive? No - Patient declined [facility] Utilization Over the Past 12 Months: #  of hospitalizations or ER visits: 0 # of surgeries: 0  Review of Systems    Patient reports that his overall health is unchanged compared to last year.  History obtained from chart review  Patient Reported Readings (BP, Pulse, CBG, Weight, etc) none  Pain Assessment Pain : No/denies pain     Current Medications & Allergies (verified) Allergies as of 12/27/2020      Reactions   Doxycycline    Sulfa Antibiotics Rash      Medication List       Accurate as of Dec 27, 2020  8:33 AM. If you have any questions, ask your nurse or doctor.        Accu-Chek Aviva Plus test strip Generic drug: glucose blood TEST BLOOD SUGAR TWICE DAILY   Accu-Chek Softclix Lancets lancets TEST BLOOD SUGAR TWICE DAILY Dx E11.9   TRUEplus Lancets 33G Misc TEST BLOOD SUGAR TWICE DAILY Dx E11.9   aspirin 81 MG tablet Take 81 mg by mouth daily.   B-D SINGLE USE SWABS REGULAR Pads USE AS DIRECTED TWICE DAILY Dx E11.69   blood glucose meter kit and supplies Kit Accucheck Meter. Check BS BID and prn Dx Code E11.9   empagliflozin 25 MG Tabs tablet Commonly known as: Jardiance Take 1 tablet (25 mg total) by mouth daily before breakfast.   fenofibrate 160 MG tablet Take 1 tablet (160 mg total) by mouth daily.   fish oil-omega-3 fatty acids 1000 MG capsule Take 1 g by mouth daily.   glipiZIDE 10 MG 24 hr tablet Commonly known as: GLUCOTROL XL Take 1 tablet (10 mg total) by mouth daily  with breakfast.   lisinopril-hydrochlorothiazide 20-12.5 MG tablet Commonly known as: ZESTORETIC Take 2 tablets by mouth daily.   MENS 50+ ADVANCED PO Take by mouth.   metFORMIN 1000 MG tablet Commonly known as: GLUCOPHAGE Take 1 tablet (1,000 mg total) by mouth 2 (two) times daily with a meal.   nitroGLYCERIN 0.4 MG SL tablet Commonly known as: NITROSTAT Place 1 tablet (0.4 mg total) under the tongue every 5 (five) minutes as needed.   rosuvastatin 40 MG tablet Commonly known as: CRESTOR Take 1  tablet (40 mg total) by mouth daily.   Semaglutide 14 MG Tabs Take 14 mg by mouth daily.   True Metrix Air Glucose Meter w/Device Kit Test BS BID Dx E11.9   True Metrix Meter w/Device Kit USE AS DIRECTED   Vitamin D 50 MCG (2000 UT) tablet Take 2,000 Units by mouth daily.       History (reviewed): Past Medical History:  Diagnosis Date  . Diabetes mellitus   . HTN (hypertension)   . Hyperlipidemia   . Overweight(278.02)   . Squamous acanthoma of upper extremity    Past Surgical History:  Procedure Laterality Date  . None    . squamous acanthoma of upper extremity     Family History  Problem Relation Age of Onset  . Diabetes Mother   . Heart disease Mother 80       CABG  . Hyperlipidemia Mother   . Hypertension Mother   . Parkinsonism Father   . Diabetes Sister   . Hyperlipidemia Sister   . Hypertension Sister   . Diabetes Brother   . Hyperlipidemia Brother   . Hypertension Brother    Social History   Socioeconomic History  . Marital status: Married    Spouse name: Philip Huang  . Number of children: 2  . Years of education: 10  . Highest education level: High school graduate  Occupational History  . 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 Smoking status: Former Smoker    Types: Cigarettes    Quit date: 08/06/1962    Years since quitting: 58.4  . Smokeless tobacco: Never Used  Vaping Use  . Vaping Use: Never used  Substance and Sexual Activity  . Alcohol use: No  . Drug use: No  . Sexual activity: Yes  Other Topics Concern  . Not on file  Social History Narrative  . Not on file   Social Determinants of Health   Financial Resource Strain: Not on file  Food Insecurity: Not on file  Transportation Needs: Not on file  Physical Activity: Not on file  Stress: Not on file  Social Connections: Not on file    Activities of Daily Living In your present state of health, do you have any difficulty  performing the following activities: 12/27/2020  Hearing? N  Vision? N  Difficulty concentrating or making decisions? N  Walking or climbing stairs? N  Dressing or bathing? N  Doing errands, shopping? N  Preparing Food and eating ? N  Using the Toilet? N  In the past six months, have you accidently leaked urine? N  Do you have problems with loss of bowel control? N  Managing your Medications? N  Managing your Finances? N  Housekeeping or managing your Housekeeping? N  Some recent data might be hidden    Patient Education/ Literacy How often do you need to have someone help you when you read instructions,  pamphlets, or other written materials from your doctor or pharmacy?: 1 - Never What is the last grade level you completed in school?: 12th grade  Exercise Current Exercise Habits: The patient does not participate in regular exercise at present, Exercise limited by: None identified  Diet Patient reports consuming 3 meals a day and 2 snack(s) a day Patient reports that his primary diet is: Regular Patient reports that she does have regular access to food.   Depression Screen PHQ 2/9 Scores 12/01/2020 05/20/2020 02/18/2020 11/19/2019 07/29/2019 05/05/2019 04/29/2019  PHQ - 2 Score 0 0 0 0 0 0 0     Fall Risk Fall Risk  12/27/2020 12/01/2020 05/20/2020 02/18/2020 11/19/2019  Falls in the past year? 0 0 0 0 0  Number falls in past yr: - - - - -  Injury with Fall? - - - - -  Follow up - - - - -  Comment - - - - -     Objective:  Philip Huang seemed alert and oriented and he participated appropriately during our telephone visit.  Blood Pressure Weight BMI  BP Readings from Last 3 Encounters:  12/01/20 139/67  05/20/20 121/68  03/22/20 (!) 160/75   Wt Readings from Last 3 Encounters:  12/01/20 186 lb (84.4 kg)  05/20/20 183 lb (83 kg)  02/18/20 187 lb (84.8 kg)   BMI Readings from Last 1 Encounters:  12/01/20 27.47 kg/m    *Unable to obtain current vital signs, weight,  and BMI due to telephone visit type  Hearing/Vision  . Philip Huang did not seem to have difficulty with hearing/understanding during the telephone conversation . Reports that he has had a formal eye exam by an eye care professional within the past year . Reports that he has had a formal hearing evaluation within the past year *Unable to fully assess hearing and vision during telephone visit type  Cognitive Function: 6CIT Screen 12/27/2020 05/05/2019  What Year? 0 points 0 points  What month? 0 points 0 points  What time? 0 points 0 points  Count back from 20 0 points 0 points  Months in reverse 0 points 4 points  Repeat phrase 0 points 0 points  Total Score 0 4   (Normal:0-7, Significant for Dysfunction: >8)  Normal Cognitive Function Screening: Yes   Immunization & Health Maintenance Record Immunization History  Administered Date(s) Administered  . Fluad Quad(high Dose 65+) 04/29/2019, 05/20/2020  . Influenza, High Dose Seasonal PF 04/27/2018  . Influenza,inj,Quad PF,6+ Mos 04/30/2015, 04/20/2017  . Influenza-Unspecified 05/20/2016  . PFIZER(Purple Top)SARS-COV-2 Vaccination 10/07/2019, 10/28/2019  . Pneumococcal Conjugate-13 08/26/2017, 08/26/2017  . Pneumococcal Polysaccharide-23 05/06/2001, 05/06/2001, 05/20/2020  . Td 08/06/2005, 06/29/2013  . Tdap 08/21/2019  . Zoster 10/28/2013    Health Maintenance  Topic Date Due  . COVID-19 Vaccine (3 - Pfizer risk 4-dose series) 02/27/2021 (Originally 11/25/2019)  . HEMOGLOBIN A1C  03/01/2021  . INFLUENZA VACCINE  03/06/2021  . OPHTHALMOLOGY EXAM  08/16/2021  . FOOT EXAM  12/01/2021  . COLONOSCOPY (Pts 45-62yr Insurance coverage will need to be confirmed)  05/17/2025  . TETANUS/TDAP  08/20/2029  . Hepatitis C Screening  Completed  . PNA vac Low Risk Adult  Completed  . HPV VACCINES  Aged Out  . COLON CANCER SCREENING ANNUAL FOBT  Discontinued       Assessment  This is a routine wellness examination for STAMI BARREN  Health Maintenance: Due or Overdue There are no preventive care reminders to display for this patient.  Philip Huang does not need a referral for Community Assistance: Care Management:   no Social Work:    no Prescription Assistance:  no Nutrition/Diabetes Education:  no   Plan:  Personalized Goals Goals Addressed            This Visit's Progress   . Exercise 150 min/wk Moderate Activity        Personalized Health Maintenance & Screening Recommendations  Patient is up to date on all health maintenance  Lung Cancer Screening Recommended: no (Low Dose CT Chest recommended if Age 49-80 years, 30 pack-year currently smoking OR have quit w/in past 15 years) Hepatitis C Screening recommended: Done 07-19-15 HIV Screening recommended: no  Advanced Directives: Written information was not prepared per patient's request.  Referrals & Orders No orders of the defined types were placed in this encounter.   Follow-up Plan . Follow-up with [name], Fransisca Kaufmann, MD as planned . Schedule for regular follow up    I have personally reviewed and noted the following in the patient's chart:   . Medical and social history . Use of alcohol, tobacco or illicit drugs  . Current medications and supplements . Functional ability and status . Nutritional status . Physical activity . Advanced directives . List of other physicians . Hospitalizations, surgeries, and ER visits in previous 12 months . Vitals . Screenings to include cognitive, depression, and falls . Referrals and appointments  In addition, I have reviewed and discussed with Philip Huang certain preventive protocols, quality metrics, and best practice recommendations. A written personalized care plan for preventive services as well as general preventive health recommendations is available and can be mailed to the patient at his request.      Rolena Infante LPN [id number]

## 2020-12-27 NOTE — Patient Instructions (Signed)
  Mr. Philip Huang , Thank you for taking time to come for your Medicare Wellness Visit. I appreciate your ongoing commitment to your health goals. Please review the following plan we discussed and let me know if I can assist you in the future.   These are the goals we discussed: Goals    . DIET - INCREASE WATER INTAKE     Try to drink 6-8 glasses of water daily.    . Exercise 150 min/wk Moderate Activity       This is a list of the screening recommended for you and due dates:  Health Maintenance  Topic Date Due  . COVID-19 Vaccine (3 - Pfizer risk 4-dose series) 02/27/2021*  . Hemoglobin A1C  03/01/2021  . Flu Shot  03/06/2021  . Eye exam for diabetics  08/16/2021  . Complete foot exam   12/01/2021  . Colon Cancer Screening  05/17/2025  . Tetanus Vaccine  08/20/2029  . Hepatitis C Screening: USPSTF Recommendation to screen - Ages 45-79 yo.  Completed  . Pneumonia vaccines  Completed  . HPV Vaccine  Aged Out  . Stool Blood Test  Discontinued  *Topic was postponed. The date shown is not the original due date.

## 2021-01-19 ENCOUNTER — Telehealth: Payer: Self-pay | Admitting: Family Medicine

## 2021-02-09 DIAGNOSIS — H40013 Open angle with borderline findings, low risk, bilateral: Secondary | ICD-10-CM | POA: Diagnosis not present

## 2021-02-11 ENCOUNTER — Other Ambulatory Visit: Payer: Self-pay | Admitting: Family Medicine

## 2021-02-11 DIAGNOSIS — E1169 Type 2 diabetes mellitus with other specified complication: Secondary | ICD-10-CM

## 2021-02-11 DIAGNOSIS — E1159 Type 2 diabetes mellitus with other circulatory complications: Secondary | ICD-10-CM

## 2021-02-11 DIAGNOSIS — E785 Hyperlipidemia, unspecified: Secondary | ICD-10-CM

## 2021-02-11 DIAGNOSIS — I152 Hypertension secondary to endocrine disorders: Secondary | ICD-10-CM

## 2021-02-18 DIAGNOSIS — E119 Type 2 diabetes mellitus without complications: Secondary | ICD-10-CM | POA: Diagnosis not present

## 2021-02-18 DIAGNOSIS — W208XXA Other cause of strike by thrown, projected or falling object, initial encounter: Secondary | ICD-10-CM | POA: Diagnosis not present

## 2021-02-18 DIAGNOSIS — Y9389 Activity, other specified: Secondary | ICD-10-CM | POA: Diagnosis not present

## 2021-02-18 DIAGNOSIS — I10 Essential (primary) hypertension: Secondary | ICD-10-CM | POA: Diagnosis not present

## 2021-02-18 DIAGNOSIS — Y929 Unspecified place or not applicable: Secondary | ICD-10-CM | POA: Diagnosis not present

## 2021-02-18 DIAGNOSIS — S61412A Laceration without foreign body of left hand, initial encounter: Secondary | ICD-10-CM | POA: Diagnosis not present

## 2021-02-20 ENCOUNTER — Other Ambulatory Visit: Payer: Self-pay | Admitting: Family Medicine

## 2021-02-20 DIAGNOSIS — E1169 Type 2 diabetes mellitus with other specified complication: Secondary | ICD-10-CM

## 2021-02-20 DIAGNOSIS — E785 Hyperlipidemia, unspecified: Secondary | ICD-10-CM

## 2021-03-01 ENCOUNTER — Telehealth: Payer: Self-pay | Admitting: Family Medicine

## 2021-03-01 DIAGNOSIS — E785 Hyperlipidemia, unspecified: Secondary | ICD-10-CM

## 2021-03-01 DIAGNOSIS — E1169 Type 2 diabetes mellitus with other specified complication: Secondary | ICD-10-CM

## 2021-03-01 DIAGNOSIS — I152 Hypertension secondary to endocrine disorders: Secondary | ICD-10-CM

## 2021-03-01 NOTE — Telephone Encounter (Signed)
Lab orders placed, wife aware

## 2021-03-06 ENCOUNTER — Other Ambulatory Visit: Payer: Self-pay

## 2021-03-06 ENCOUNTER — Other Ambulatory Visit: Payer: Medicare HMO

## 2021-03-06 DIAGNOSIS — E1159 Type 2 diabetes mellitus with other circulatory complications: Secondary | ICD-10-CM | POA: Diagnosis not present

## 2021-03-06 DIAGNOSIS — E785 Hyperlipidemia, unspecified: Secondary | ICD-10-CM

## 2021-03-06 DIAGNOSIS — I152 Hypertension secondary to endocrine disorders: Secondary | ICD-10-CM | POA: Diagnosis not present

## 2021-03-06 DIAGNOSIS — E1169 Type 2 diabetes mellitus with other specified complication: Secondary | ICD-10-CM | POA: Diagnosis not present

## 2021-03-06 LAB — CMP14+EGFR
ALT: 26 IU/L (ref 0–44)
AST: 26 IU/L (ref 0–40)
Albumin/Globulin Ratio: 1.7 (ref 1.2–2.2)
Albumin: 4.5 g/dL (ref 3.8–4.8)
Alkaline Phosphatase: 54 IU/L (ref 44–121)
BUN/Creatinine Ratio: 21 (ref 10–24)
BUN: 25 mg/dL (ref 8–27)
Bilirubin Total: 0.3 mg/dL (ref 0.0–1.2)
CO2: 24 mmol/L (ref 20–29)
Calcium: 9.6 mg/dL (ref 8.6–10.2)
Chloride: 100 mmol/L (ref 96–106)
Creatinine, Ser: 1.21 mg/dL (ref 0.76–1.27)
Globulin, Total: 2.7 g/dL (ref 1.5–4.5)
Glucose: 121 mg/dL — ABNORMAL HIGH (ref 65–99)
Potassium: 4.2 mmol/L (ref 3.5–5.2)
Sodium: 139 mmol/L (ref 134–144)
Total Protein: 7.2 g/dL (ref 6.0–8.5)
eGFR: 65 mL/min/{1.73_m2} (ref >59)

## 2021-03-06 LAB — CBC WITH DIFFERENTIAL/PLATELET
Basophils Absolute: 0.1 10*3/uL (ref 0.0–0.2)
Basos: 1 %
EOS (ABSOLUTE): 0.5 10*3/uL — ABNORMAL HIGH (ref 0.0–0.4)
Eos: 7 %
Hematocrit: 44.9 % (ref 37.5–51.0)
Hemoglobin: 14.9 g/dL (ref 13.0–17.7)
Immature Grans (Abs): 0 10*3/uL (ref 0.0–0.1)
Immature Granulocytes: 0 %
Lymphocytes Absolute: 2.4 10*3/uL (ref 0.7–3.1)
Lymphs: 32 %
MCH: 29.3 pg (ref 26.6–33.0)
MCHC: 33.2 g/dL (ref 31.5–35.7)
MCV: 88 fL (ref 79–97)
Monocytes Absolute: 0.6 10*3/uL (ref 0.1–0.9)
Monocytes: 8 %
Neutrophils Absolute: 3.9 10*3/uL (ref 1.4–7.0)
Neutrophils: 52 %
Platelets: 266 10*3/uL (ref 150–450)
RBC: 5.09 x10E6/uL (ref 4.14–5.80)
RDW: 13.1 % (ref 11.6–15.4)
WBC: 7.5 10*3/uL (ref 3.4–10.8)

## 2021-03-06 LAB — LIPID PANEL
Chol/HDL Ratio: 4 ratio (ref 0.0–5.0)
Cholesterol, Total: 168 mg/dL (ref 100–199)
HDL: 42 mg/dL (ref >39)
LDL Chol Calc (NIH): 105 mg/dL — ABNORMAL HIGH (ref 0–99)
Triglycerides: 117 mg/dL (ref 0–149)
VLDL Cholesterol Cal: 21 mg/dL (ref 5–40)

## 2021-03-06 LAB — BAYER DCA HB A1C WAIVED: HB A1C (BAYER DCA - WAIVED): 7.5 % — ABNORMAL HIGH (ref <7.0)

## 2021-03-07 LAB — MICROALBUMIN / CREATININE URINE RATIO
Creatinine, Urine: 96.4 mg/dL
Microalb/Creat Ratio: 5 mg/g creat (ref 0–29)
Microalbumin, Urine: 5.3 ug/mL

## 2021-03-09 ENCOUNTER — Other Ambulatory Visit: Payer: Self-pay

## 2021-03-09 ENCOUNTER — Encounter: Payer: Self-pay | Admitting: Family Medicine

## 2021-03-09 ENCOUNTER — Ambulatory Visit (INDEPENDENT_AMBULATORY_CARE_PROVIDER_SITE_OTHER): Payer: Medicare HMO | Admitting: Family Medicine

## 2021-03-09 VITALS — BP 136/64 | HR 89 | Ht 69.0 in | Wt 187.0 lb

## 2021-03-09 DIAGNOSIS — E1169 Type 2 diabetes mellitus with other specified complication: Secondary | ICD-10-CM | POA: Diagnosis not present

## 2021-03-09 DIAGNOSIS — I152 Hypertension secondary to endocrine disorders: Secondary | ICD-10-CM | POA: Diagnosis not present

## 2021-03-09 DIAGNOSIS — E1159 Type 2 diabetes mellitus with other circulatory complications: Secondary | ICD-10-CM

## 2021-03-09 DIAGNOSIS — E785 Hyperlipidemia, unspecified: Secondary | ICD-10-CM | POA: Diagnosis not present

## 2021-03-09 NOTE — Progress Notes (Signed)
BP 136/64   Pulse 89   Ht _0  (1.753 m)   Wt 187 lb (84.8 kg)   SpO2 98%   BMI 27.62 kg/m    Subjective:   Patient ID: Philip Huang, male    DOB: August 10, 1950, 70 y.o.   MRN: [id number]  HPI: Philip Huang is a 70 y.o. male presenting on 03/09/2021 for Medical Management of Chronic Issues and Diabetes   HPI Type 2 diabetes mellitus Patient comes in today for recheck of his diabetes. Patient has been currently taking Jardiance and glipizide and metformin and semaglutide. Patient is currently on an ACE inhibitor/ARB. Patient has not seen an ophthalmologist this year. Patient denies any issues with their feet. The symptom started onset as an adult hypertension and hyperlipidemia ARE RELATED TO DM   Hypertension Patient is currently on lisinopril hydrochlorothiazide, and their blood pressure today is 136/64. Patient denies any lightheadedness or dizziness. Patient denies headaches, blurred vision, chest pains, shortness of breath, or weakness. Denies any side effects from medication and is content with current medication.   Hyperlipidemia Patient is coming in for recheck of his hyperlipidemia. The patient is currently taking fenofibrate and fish oil and Crestor. They deny any issues with myalgias or history of liver damage from it. They deny any focal numbness or weakness or chest pain.    Relevant past medical, surgical, family and social history reviewed and updated as indicated. Interim medical history since our last visit reviewed. Allergies and medications reviewed and updated.  Review of Systems  Constitutional:  Negative for chills and fever.  Eyes:  Negative for visual disturbance.  Respiratory:  Negative for shortness of breath and wheezing.   Cardiovascular:  Negative for chest pain and leg swelling.  Musculoskeletal:  Negative for back pain and gait problem.  Skin:  Negative for rash.  Neurological:  Negative for dizziness and weakness.  All other systems  reviewed and are negative.  Per HPI unless specifically indicated above   Allergies as of 03/09/2021       Reactions   Doxycycline    Sulfa Antibiotics Rash        Medication List        Accurate as of March 09, 2021  9:29 AM. If you have any questions, ask your nurse or doctor.          Accu-Chek Aviva Plus test strip Generic drug: glucose blood TEST BLOOD SUGAR TWICE DAILY   Accu-Chek Softclix Lancets lancets TEST BLOOD SUGAR TWICE DAILY Dx E11.9   TRUEplus Lancets 33G Misc TEST BLOOD SUGAR TWICE DAILY Dx E11.9   aspirin 81 MG tablet Take 81 mg by mouth daily.   B-D SINGLE USE SWABS REGULAR Pads USE AS DIRECTED TWICE DAILY Dx E11.69   blood glucose meter kit and supplies Kit Accucheck Meter. Check BS BID and prn Dx Code E11.9   empagliflozin 25 MG Tabs tablet Commonly known as: Jardiance Take 1 tablet (25 mg total) by mouth daily before breakfast.   fenofibrate 160 MG tablet TAKE 1 TABLET EVERY DAY   fish oil-omega-3 fatty acids 1000 MG capsule Take 1 g by mouth daily.   glipiZIDE 10 MG 24 hr tablet Commonly known as: GLUCOTROL XL Take 1 tablet (10 mg total) by mouth daily with breakfast.   lisinopril-hydrochlorothiazide 20-12.5 MG tablet Commonly known as: ZESTORETIC TAKE 2 TABLETS EVERY DAY.   MENS 50+ ADVANCED PO Take by mouth.   metFORMIN 1000 MG tablet Commonly known as: GLUCOPHAGE TAKE 1  TABLET TWICE DAILY WITH MEALS.   nitroGLYCERIN 0.4 MG SL tablet Commonly known as: NITROSTAT Place 1 tablet (0.4 mg total) under the tongue every 5 (five) minutes as needed.   rosuvastatin 40 MG tablet Commonly known as: CRESTOR Take 1 tablet (40 mg total) by mouth daily.   Semaglutide 14 MG Tabs Take 14 mg by mouth daily.   True Metrix Air Glucose Meter w/Device Kit Test BS BID Dx E11.9   True Metrix Meter w/Device Kit USE AS DIRECTED   Vitamin D 50 MCG (2000 UT) tablet Take 2,000 Units by mouth daily.         Objective:   BP 136/64    Pulse 89   Ht _0  (1.753 m)   Wt 187 lb (84.8 kg)   SpO2 98%   BMI 27.62 kg/m   Wt Readings from Last 3 Encounters:  03/09/21 187 lb (84.8 kg)  12/01/20 186 lb (84.4 kg)  05/20/20 183 lb (83 kg)    Physical Exam Vitals and nursing note reviewed.  Constitutional:      General: He is not in acute distress.    Appearance: He is well-developed. He is not diaphoretic.  Eyes:     General: No scleral icterus.    Conjunctiva/sclera: Conjunctivae normal.  Neck:     Thyroid: No thyromegaly.  Cardiovascular:     Rate and Rhythm: Normal rate and regular rhythm.     Heart sounds: Normal heart sounds. No murmur heard. Pulmonary:     Effort: Pulmonary effort is normal. No respiratory distress.     Breath sounds: Normal breath sounds. No wheezing.  Musculoskeletal:        General: No swelling. Normal range of motion.     Cervical back: Neck supple.  Lymphadenopathy:     Cervical: No cervical adenopathy.  Skin:    General: Skin is warm and dry.     Findings: No rash.  Neurological:     Mental Status: He is alert and oriented to person, place, and time.     Coordination: Coordination normal.  Psychiatric:        Behavior: Behavior normal.    Results for orders placed or performed in visit on 03/06/21  Lipid panel  Result Value Ref Range   Cholesterol, Total 168 100 - 199 mg/dL   Triglycerides 117 0 - 149 mg/dL   HDL 42 >39 mg/dL   VLDL Cholesterol Cal 21 5 - 40 mg/dL   LDL Chol Calc (NIH) 105 (H) 0 - 99 mg/dL   Chol/HDL Ratio 4.0 0.0 - 5.0 ratio  CMP14+EGFR  Result Value Ref Range   Glucose 121 (H) 65 - 99 mg/dL   BUN 25 8 - 27 mg/dL   Creatinine, Ser 1.21 0.76 - 1.27 mg/dL   eGFR 65 >59 mL/min/1.73   BUN/Creatinine Ratio 21 10 - 24   Sodium 139 134 - 144 mmol/L   Potassium 4.2 3.5 - 5.2 mmol/L   Chloride 100 96 - 106 mmol/L   CO2 24 20 - 29 mmol/L   Calcium 9.6 8.6 - 10.2 mg/dL   Total Protein 7.2 6.0 - 8.5 g/dL   Albumin 4.5 3.8 - 4.8 g/dL   Globulin, Total 2.7  1.5 - 4.5 g/dL   Albumin/Globulin Ratio 1.7 1.2 - 2.2   Bilirubin Total 0.3 0.0 - 1.2 mg/dL   Alkaline Phosphatase 54 44 - 121 IU/L   AST 26 0 - 40 IU/L   ALT 26 0 - 44 IU/L  CBC  with Differential/Platelet  Result Value Ref Range   WBC 7.5 3.4 - 10.8 x10E3/uL   RBC 5.09 4.14 - 5.80 x10E6/uL   Hemoglobin 14.9 13.0 - 17.7 g/dL   Hematocrit 44.9 37.5 - 51.0 %   MCV 88 79 - 97 fL   MCH 29.3 26.6 - 33.0 pg   MCHC 33.2 31.5 - 35.7 g/dL   RDW 13.1 11.6 - 15.4 %   Platelets 266 150 - 450 x10E3/uL   Neutrophils 52 Not Estab. %   Lymphs 32 Not Estab. %   Monocytes 8 Not Estab. %   Eos 7 Not Estab. %   Basos 1 Not Estab. %   Neutrophils Absolute 3.9 1.4 - 7.0 x10E3/uL   Lymphocytes Absolute 2.4 0.7 - 3.1 x10E3/uL   Monocytes Absolute 0.6 0.1 - 0.9 x10E3/uL   EOS (ABSOLUTE) 0.5 (H) 0.0 - 0.4 x10E3/uL   Basophils Absolute 0.1 0.0 - 0.2 x10E3/uL   Immature Granulocytes 0 Not Estab. %   Immature Grans (Abs) 0.0 0.0 - 0.1 x10E3/uL  Bayer DCA Hb A1c Waived  Result Value Ref Range   HB A1C (BAYER DCA - WAIVED) 7.5 (H) <7.0 %  Microalbumin / creatinine urine ratio  Result Value Ref Range   Creatinine, Urine 96.4 Not Estab. mg/dL   Microalbumin, Urine 5.3 Not Estab. ug/mL   Microalb/Creat Ratio 5 0 - 29 mg/g creat    Assessment & Plan:   Problem List Items Addressed This Visit       Cardiovascular and Mediastinum   Hypertension associated with diabetes ([place])     Endocrine   Type II diabetes mellitus ([place]) - Primary (Chronic)   Hyperlipidemia associated with type 2 diabetes mellitus (H[facility])    A1c is slightly up, continue current medicine.  LDL is slightly up, Follow up plan: No follow-ups on file.  Counseling provided for all of the vaccine components No orders of the defined types were placed in this encounter.   Caryl Pina, MD [facility] 03/09/2021, 9:29 AM

## 2021-04-21 ENCOUNTER — Ambulatory Visit (INDEPENDENT_AMBULATORY_CARE_PROVIDER_SITE_OTHER): Payer: Medicare HMO | Admitting: Pharmacist

## 2021-04-21 DIAGNOSIS — E1169 Type 2 diabetes mellitus with other specified complication: Secondary | ICD-10-CM

## 2021-04-25 MED ORDER — EMPAGLIFLOZIN 25 MG PO TABS: 25.0000 mg | ORAL_TABLET | Freq: Every day | ORAL | 3 refills | Status: DC

## 2021-04-25 NOTE — Patient Instructions (Signed)
Visit Information  PATIENT GOALS:  Goals Addressed               This Visit's Progress     Patient Stated     T2DM-PHARMD GOAL (pt-stated)        Current Barriers:  Unable to independently afford treatment regimen Unable to achieve control of T2DM   Pharmacist Clinical Goal(s):  Over the next 180 days, patient will verbalize ability to afford treatment regimen achieve control of T2DM as evidenced by IMPROVED GLYCEMIC CONTROL/GOAL A1C through collaboration with PharmD and provider.    Interventions: 1:1 collaboration with [name], Fransisca Kaufmann, MD regarding development and update of comprehensive plan of care as evidenced by provider attestation and co-signature Inter-disciplinary care team collaboration (see longitudinal plan of care) Comprehensive medication review performed; medication list updated in electronic medical record  Diabetes: Uncontrolled-A1C 7.5%, GFR 65;  Current treatment: Metformin 1g BID Rybelsus 14mg  daily Jardiance 25mg  daily Glipizide 10mg  XL daily  Current glucose readings: fasting glucose: <160, post prandial glucose: <200 Denies hypoglycemic/hyperglycemic symptoms Current meal patterns: doesn't have a large appetite on Rybelsus 14mg  daily Discussed meal planning options and Plate method for healthy eating Avoid sugary drinks and desserts Incorporate balanced protein, non starchy veggies, 1 serving of carbohydrate with each meal Increase water intake Increase physical activity as able Current exercise: works/active outdoors Educated on diabetes (making additional lifestyle changes) and taking medications as prescribed Collaborated with PCP--will not change therapy at this time; recheck A1c; patient does not wish to have an injectable medications; continue all medications as prescribed Assessed patient finances. Patient enrolled in [employer] Patient Assistance for [place] Patient Assistance for  [employer]   Patient Goals/Self-Care Activities Over the next 180 days, patient will:  - take medications as prescribed check glucose 3X WEEKLY OR IF SYMPTOMATIC, document, and provide at future appointments collaborate with provider on medication access solutions  Follow Up Plan: Telephone follow up appointment with care management team member scheduled for: 1 MONTH         The patient verbalized understanding of instructions, educational materials, and care plan provided today and declined offer to receive copy of patient instructions, educational materials, and care plan.   Telephone follow up appointment with care management team member scheduled for: 1 MONTH  Signature Regina Eck, PharmD, BCPS Clinical Pharmacist, [place]  [facility] Phone [phone]

## 2021-04-25 NOTE — Progress Notes (Signed)
Chronic Care Management Pharmacy Note  04/21/2021 Name:  Philip Huang MRN:  [id number] DOB:  1951-07-31  Summary: T2DM  Recommendations/Changes made from today's visit: Diabetes: Uncontrolled-A1C 7.5%, GFR 65;  Current treatment: Metformin 1g BID Rybelsus 32m daily Jardiance 270mdaily Glipizide 1021mL daily  Current glucose readings: fasting glucose: <160, post prandial glucose: <200 Denies hypoglycemic/hyperglycemic symptoms Current meal patterns: doesn't have a large appetite on Rybelsus 29m49mily Discussed meal planning options and Plate method for healthy eating Avoid sugary drinks and desserts Incorporate balanced protein, non starchy veggies, 1 serving of carbohydrate with each meal Increase water intake Increase physical activity as able Current exercise: works/active outdoors Educated on diabetes (making additional lifestyle changes) and taking medications as prescribed Collaborated with PCP--will not change therapy at this time; recheck A1c; patient does not wish to have an injectable medications; continue all medications as prescribed Assessed patient finances. Patient enrolled in [employer] Assistance for [employer] Assistance for JardTime Warnerfills sent to [employer] cWestern [employer] Southern Financiales today)  Follow Up Plan: Telephone follow up appointment with care management team member scheduled for: 1 MONTH   Subjective: Philip Huang 70 y52. year old male who is a primary patient of [name], [name].  The CCM team was consulted for assistance with disease management and care coordination needs.    Engaged with patient by telephone for initial visit in response to provider referral for pharmacy case management and/or care coordination services.   Consent to Services:  The patient was given the following information about Chronic Care Management services today, agreed to services, and gave verbal consent: 1. CCM service  includes personalized support from designated clinical staff supervised by the primary care provider, including individualized plan of care and coordination with other care providers 2. 24/7 contact phone numbers for assistance for urgent and routine care needs. 3. Service will only be billed when office clinical staff spend 20 minutes or more in a month to coordinate care. 4. Only one practitioner may furnish and bill the service in a calendar month. 5.The patient may stop CCM services at any time (effective at the end of the month) by phone call to the office staff. 6. The patient will be responsible for cost sharing (co-pay) of up to 20% of the service fee (after annual deductible is met). Patient agreed to services and consent obtained.  Patient Care Team: [name], [name] as PCP - General (Family Medicine) PruiLavera GuiseH [facility])  Objective:  Lab Results  Component Value Date   CREATININE 1.21 03/06/2021   CREATININE 1.38 (H) 11/30/2020   CREATININE 1.33 (H) 08/25/2020    Lab Results  Component Value Date   HGBA1C 7.5 (H) 03/06/2021   Last diabetic Eye exam:  Lab Results  Component Value Date/Time   HMDIABEYEEXA Retinopathy (A) 08/16/2020 12:00 AM    Last diabetic Foot exam: No results found for: HMDIABFOOTEX      Component Value Date/Time   CHOL 168 03/06/2021 0823   CHOL 168 02/02/2013 0843   TRIG 117 03/06/2021 0823   TRIG 209 (H) 10/22/2014 0810   TRIG 164 (H) 02/02/2013 0843   HDL 42 03/06/2021 0823   HDL 36 (L) 10/22/2014 0810   HDL 43 02/02/2013 0843   CHOLHDL 4.0 03/06/2021 0823   LDLCALC 105 (H) 03/06/2021 0823   LDLCALC 100 (H) 03/12/2014 1600   LDLCALC 92 02/02/2013 0843    Hepatic Function Latest Ref Rng &  Units 03/06/2021 11/30/2020 08/25/2020  Total Protein 6.0 - 8.5 g/dL 7.2 7.5 7.5  Albumin 3.8 - 4.8 g/dL 4.5 4.7 4.7  AST 0 - 40 IU/L 26 29 24   ALT 0 - 44 IU/L 26 31 25   Alk Phosphatase 44 - 121 IU/L 54 43(L) 45  Total Bilirubin 0.0 -  1.2 mg/dL 0.3 0.4 0.3    Lab Results  Component Value Date/Time   TSH 2.501 02/02/2013 08:43 AM    CBC Latest Ref Rng & Units 03/06/2021 05/19/2020 07/28/2019  WBC 3.4 - 10.8 x10E3/uL 7.5 7.6 7.5  Hemoglobin 13.0 - 17.7 g/dL 14.9 15.0 15.3  Hematocrit 37.5 - 51.0 % 44.9 44.7 44.7  Platelets 150 - 450 x10E3/uL 266 253 239    Lab Results  Component Value Date/Time   VD25OH 42 02/02/2013 08:43 AM    Clinical ASCVD: No  The 10-year ASCVD risk score (Arnett DK, et al., 2019) is: 34.8%   Values used to calculate the score:     Age: 70 years     Sex: Male     Is Non-Hispanic African American: No     Diabetic: Yes     Tobacco smoker: No     Systolic Blood Pressure: 859 mmHg     Is BP treated: Yes     HDL Cholesterol: 42 mg/dL     Total Cholesterol: 168 mg/dL    Other: (CHADS2VASc if Afib, PHQ9 if depression, MMRC or CAT for COPD, ACT, DEXA)  Social History   Tobacco Use  Smoking Status Former   Types: Cigarettes   Quit date: 08/06/1962   Years since quitting: 58.7  Smokeless Tobacco Never   BP Readings from Last 3 Encounters:  03/09/21 136/64  12/01/20 139/67  05/20/20 121/68   Pulse Readings from Last 3 Encounters:  03/09/21 89  12/01/20 67  05/20/20 63   Wt Readings from Last 3 Encounters:  03/09/21 187 lb (84.8 kg)  12/01/20 186 lb (84.4 kg)  05/20/20 183 lb (83 kg)    Assessment: Review of patient past medical history, allergies, medications, health status, including review of consultants reports, laboratory and other test data, was performed as part of comprehensive evaluation and provision of chronic care management services.   SDOH:  (Social Determinants of Health) assessments and interventions performed:    CCM Care Plan  Allergies  Allergen Reactions   Doxycycline    Sulfa Antibiotics Rash    Medications Reviewed Today     Reviewed by Lavera Guise, [facility] (Pharmacist) on 04/25/21 at [place] List Status: <None>   Medication Order Taking? Sig  Documenting Provider Last Dose Status Informant  Accu-Chek Softclix Lancets lancets [id number] No TEST BLOOD SUGAR TWICE DAILY Dx E11.9 [name], Fransisca Kaufmann, MD Taking Active   Alcohol Swabs (B-D SINGLE USE SWABS REGULAR) PADS [id number] No USE AS DIRECTED TWICE DAILY Dx E11.69 [name], Fransisca Kaufmann, MD Taking Active   aspirin 81 MG tablet [id number] No Take 81 mg by mouth daily. [provider] Taking Active   blood glucose meter kit and supplies KIT [id number] No Accucheck Meter. Check BS BID and prn Dx Code E11.9 [name], Fransisca Kaufmann, MD Taking Active   Blood Glucose Monitoring Suppl (TRUE METRIX AIR GLUCOSE METER) w/Device KIT [id number] No Test BS BID Dx E11.9 [name], Fransisca Kaufmann, MD Taking Active   Blood Glucose Monitoring Suppl (TRUE METRIX METER) w/Device KIT [id number] No USE AS DIRECTED [name], Fransisca Kaufmann, MD Taking Active   Cholecalciferol (VITAMIN D) 2000 UNITS tablet  [id number] No Take 2,000 Units by mouth daily. [provider] Taking Active            Med Note Blanca Friend, Royce Macadamia   Fri Nov 20, 2019  8:34 AM) PAP   empagliflozin (JARDIANCE) 25 MG TABS tablet [id number] No Take 1 tablet (25 mg total) by mouth daily before breakfast. [name], Fransisca Kaufmann, MD Taking Active            Med Note Blanca Friend, Royce Macadamia   Tue Apr 25, 2021  9:25 AM) [place] [employer] Patient assistance program--ships to patient's home  fenofibrate 160 MG tablet [id number] No TAKE 1 TABLET EVERY DAY [name], Fransisca Kaufmann, MD Taking Active   fish oil-omega-3 fatty acids 1000 MG capsule [id number] No Take 1 g by mouth daily. [provider] Taking Active   glipiZIDE (GLUCOTROL XL) 10 MG 24 hr tablet [id number] No Take 1 tablet (10 mg total) by mouth daily with breakfast. [name], Fransisca Kaufmann, MD Taking Active   glucose blood (ACCU-CHEK AVIVA PLUS) test strip [id number] No TEST BLOOD SUGAR TWICE DAILY [name], Fransisca Kaufmann, MD Taking Active   lisinopril-hydrochlorothiazide (ZESTORETIC) 20-12.5 MG tablet  [id number] No TAKE 2 TABLETS EVERY DAY. [name], Fransisca Kaufmann, MD Taking Active   metFORMIN (GLUCOPHAGE) 1000 MG tablet [id number] No TAKE 1 TABLET TWICE DAILY WITH MEALS. [name], Fransisca Kaufmann, MD Taking Active   Multiple Vitamins-Minerals (MENS 50+ ADVANCED PO) [id number] No Take by mouth. [provider] Taking Active   nitroGLYCERIN (NITROSTAT) 0.4 MG SL tablet [id number] No Place 1 tablet (0.4 mg total) under the tongue every 5 (five) minutes as needed. [name], Fransisca Kaufmann, MD Taking Active   rosuvastatin (CRESTOR) 40 MG tablet [id number] No Take 1 tablet (40 mg total) by mouth daily. [name], Fransisca Kaufmann, MD Taking Active   Semaglutide 14 MG TABS [id number] No Take 14 mg by mouth daily. 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 03/02/2016   Abscess of hand, left 02/20/2016   BMI 28.0-28.9,adult    Hyperlipidemia associated with type 2 diabetes mellitus ([place]) 12/19/2010   Hypertension associated with diabetes ([place]) 12/19/2010   Type II diabetes mellitus ([place]) 12/19/2010   Joint pain 12/19/2010    Immunization History  Administered Date(s) Administered   Fluad Quad(high Dose 65+) 04/29/2019, 05/20/2020   Influenza, High Dose Seasonal PF 04/27/2018   Influenza,inj,Quad PF,6+ Mos 04/30/2015, 04/20/2017   Influenza-Unspecified 05/20/2016   PFIZER(Purple Top)SARS-COV-2 Vaccination 10/07/2019, 10/28/2019   Pneumococcal Conjugate-13 08/26/2017, 08/26/2017   Pneumococcal Polysaccharide-23 05/06/2001, 05/06/2001, 05/20/2020   Td 08/06/2005, 06/29/2013   Tdap 08/21/2019   Zoster, Live 10/28/2013    Conditions to be  addressed/monitored: DMII  Care Plan : PHARMD MEDICATION MANAGEMENT  Updates made by Lavera Guise, [place] since 04/25/2021 12:00 AM     Problem: DISEASE PROGRESSION PREVENTION      Long-Range Goal: T2DM   This Visit's Progress: Not on track  Priority: High  Note:   Current Barriers:  Unable to independently afford treatment regimen Unable  to achieve control of T2DM   Pharmacist Clinical Goal(s):  Over the next 180 days, patient will verbalize ability to afford treatment regimen achieve control of T2DM as evidenced by IMPROVED GLYCEMIC CONTROL/GOAL A1C  through collaboration with PharmD and provider.    Interventions: 1:1 collaboration with [name], Fransisca Kaufmann, MD regarding development and update of comprehensive plan of care as evidenced by provider attestation and co-signature Inter-disciplinary care team collaboration (see longitudinal plan of care) Comprehensive medication review performed; medication list updated in electronic medical record  Diabetes: Uncontrolled-A1C 7.5%, GFR 65;  Current treatment: Metformin 1g BID Rybelsus 71m daily Jardiance 239mdaily Glipizide 1064mL daily  Current glucose readings: fasting glucose: <160, post prandial glucose: <200 Denies hypoglycemic/hyperglycemic symptoms Current meal patterns: doesn't have a large appetite on Rybelsus 46m9mily Discussed meal planning options and Plate method for healthy eating Avoid sugary drinks and desserts Incorporate balanced protein, non starchy veggies, 1 serving of carbohydrate with each meal Increase water intake Increase physical activity as able Current exercise: works/active outdoors Educated on diabetes (making additional lifestyle changes) and taking medications as prescribed Collaborated with PCP--will not change therapy at this time; recheck A1c; patient does not wish to have an injectable medications; continue all medications as prescribed Assessed patient finances. Patient enrolled in  [employer] Assistance for [employer] Assistance for Jard[employer] Warneratient Goals/Self-Care Activities Over the next 180 days, patient will:  - take medications as prescribed check glucose 3X WEEKLY OR IF SYMPTOMATIC, document, and provide at future appointments collaborate with provider on medication access solutions  Follow Up Plan: Telephone follow up appointment with care management team member scheduled for: 1 MONTH      Medication Assistance: Assessed patient finances. Patient enrolled in [employer] Assistance for Rybelsus 14,MG DAILY (SHIPS TO PCP OFFICE) and Boeh[place]ient Assistance for JardTime WarnerIPWorld Golf Villageatient's preferred pharmacy is:  [facility] [id number]: [phone]: [phone] [id number]: [phone]: [phone] Up:  Patient agrees to Care Plan and Follow-up.  Plan: Telephone follow up appointment with care management team member scheduled for:  1 MONTH    JuliRegina EckarmD, BCPS Clinical Pharmacist, [place] Phone [phone]

## 2021-05-05 DIAGNOSIS — E1169 Type 2 diabetes mellitus with other specified complication: Secondary | ICD-10-CM | POA: Diagnosis not present

## 2021-05-19 ENCOUNTER — Telehealth: Payer: Self-pay | Admitting: Family Medicine

## 2021-05-19 NOTE — Telephone Encounter (Signed)
Patient assistance- has it came in?

## 2021-05-24 NOTE — Telephone Encounter (Signed)
Can you check on status and inform patient on rybelsus shipment?

## 2021-05-25 ENCOUNTER — Telehealth: Payer: Self-pay | Admitting: Family Medicine

## 2021-05-25 NOTE — Telephone Encounter (Signed)
Received today. Patient called this morning to come and pick up. Please see telephone call from today.

## 2021-05-25 NOTE — Telephone Encounter (Signed)
Ryblesus 14mg  arrived today for patient. Patient called and message left for them to come by and pick up.

## 2021-05-25 NOTE — Telephone Encounter (Signed)
Please let patient know:  Rybelsus shipped out yesterday (4 boxes). Was given tracking # but doesn't seem to work yet. Will give NOVO a call if not rec'd in next week.   (UPS tracking [phone])   I have a box of 7mg  in my file cabinet (only if they really need)

## 2021-05-26 ENCOUNTER — Ambulatory Visit (INDEPENDENT_AMBULATORY_CARE_PROVIDER_SITE_OTHER): Payer: Medicare HMO | Admitting: Pharmacist

## 2021-05-26 DIAGNOSIS — E1169 Type 2 diabetes mellitus with other specified complication: Secondary | ICD-10-CM

## 2021-05-26 NOTE — Patient Instructions (Signed)
Visit Information  PATIENT GOALS:  Goals Addressed               This Visit's Progress     Patient Stated     T2DM-PHARMD GOAL (pt-stated)        Current Barriers:  Unable to independently afford treatment regimen Unable to achieve control of T2DM   Pharmacist Clinical Goal(s):  Over the next 180 days, patient will verbalize ability to afford treatment regimen achieve control of T2DM as evidenced by IMPROVED GLYCEMIC CONTROL/GOAL A1C through collaboration with PharmD and provider.   Interventions: 1:1 collaboration with [name], Fransisca Kaufmann, MD regarding development and update of comprehensive plan of care as evidenced by provider attestation and co-signature Inter-disciplinary care team collaboration (see longitudinal plan of care) Comprehensive medication review performed; medication list updated in electronic medical record  Diabetes: Uncontrolled-A1C 7.5%, GFR 65;  Current treatment: Metformin 1g BID Rybelsus 14mg  daily Via [employer] patient assistance program (last shipment arrived 05/25/21 to PCP office) Vania Rea 25mg  daily Via [employer] patient assistance program Glipizide 10mg  XL daily  Of note, patient does not wish to go on injectable--we have discussed adding/switching to if A1c still not controlled at f/u PCP appt Current glucose readings: fasting glucose: <160, post prandial glucose: <200 Denies hypoglycemic/hyperglycemic symptoms Current meal patterns: doesn't have a large appetite on Rybelsus 14mg  daily Discussed meal planning options and Plate method for healthy eating Avoid sugary drinks and desserts Incorporate balanced protein, non starchy veggies, 1 serving of carbohydrate with each meal Increase water intake Increase physical activity as able Current exercise: works/active outdoors Educated on diabetes (making additional lifestyle changes) and taking medications as prescribed Collaborated with PCP--will not change therapy at this time;  recheck A1c; patient does not wish to have an injectable medications; continue all medications as prescribed Assessed patient finances. Patient enrolled in [employer] Patient Assistance for [place] Patient Assistance for [employer]   Patient Goals/Self-Care Activities Over the next 180 days, patient will:  - take medications as prescribed check glucose 3X WEEKLY OR IF SYMPTOMATIC, document, and provide at future appointments collaborate with provider on medication access solutions  Follow Up Plan: Telephone follow up appointment with care management team member scheduled for: 1 MONTH         The patient verbalized understanding of instructions, educational materials, and care plan provided today and declined offer to receive copy of patient instructions, educational materials, and care plan.   Telephone follow up appointment with care management team member scheduled for: 08/05/21  Signature Philip Huang, PharmD, BCPS Clinical Pharmacist, [place]  [place] Phone [phone]

## 2021-05-26 NOTE — Progress Notes (Signed)
Chronic Care Management Pharmacy Note  05/26/2021 Name:  Philip STAPEL MRN:  [id number] DOB:  07-25-1951  Summary: T2DM  Recommendations/Changes made from today's visit: Diabetes: Uncontrolled-A1C 7.5%, GFR 65;  Current treatment: Metformin 1g BID Rybelsus 47m daily Via [employer] patient assistance program (last shipment arrived 05/25/21 to PCP office) Jardiance 253mdaily Via [employer] patient assistance program Glipizide 1022mL daily  Of note, patient does not wish to go on injectable--we have discussed adding/switching to if A1c still not controlled at f/u PCP appt Current glucose readings: fasting glucose: <160, post prandial glucose: <200 Denies hypoglycemic/hyperglycemic symptoms Current meal patterns: doesn't have a large appetite on Rybelsus 36m55mily Discussed meal planning options and Plate method for healthy eating Avoid sugary drinks and desserts Incorporate balanced protein, non starchy veggies, 1 serving of carbohydrate with each meal Increase water intake Increase physical activity as able Current exercise: works/active outdoors Educated on diabetes (making additional lifestyle changes) and taking medications as prescribed Collaborated with PCP--will not change therapy at this time; recheck A1c; patient does not wish to have an injectable medications; continue all medications as prescribed Assessed patient finances. Patient enrolled in [employer] Assistance for [place] Assistance for Jard[employer] Warnerllow Up Plan: Telephone follow up appointment with care management team member scheduled for: 1 MONTH   Subjective: Philip Huang 70 y66. year old male who is a primary patient of Philip Huang.  The CCM team was consulted for assistance with disease management and care coordination needs.    Engaged with patient by telephone for follow up visit in response to provider referral  for pharmacy case management and/or care coordination services.   Consent to Services:  The patient was given information about Chronic Care Management services, agreed to services, and gave verbal consent prior to initiation of services.  Please see initial visit note for detailed documentation.   Patient Care Team: Philip Huang as PCP - General (Family Medicine) Pru[facility]vera [facility]H [facility])  Objective:  Lab Results  Component Value Date   CREATININE 1.21 03/06/2021   CREATININE 1.38 (H) 11/30/2020   CREATININE 1.33 (H) 08/25/2020    Lab Results  Component Value Date   HGBA1C 7.5 (H) 03/06/2021   Last diabetic Eye exam:  Lab Results  Component Value Date/Time   HMDIABEYEEXA Retinopathy (A) 08/16/2020 12:00 AM    Last diabetic Foot exam: No results found for: HMDIABFOOTEX      Component Value Date/Time   CHOL 168 03/06/2021 0823   CHOL 168 02/02/2013 0843   TRIG 117 03/06/2021 0823   TRIG 209 (H) 10/22/2014 0810   TRIG 164 (H) 02/02/2013 0843   HDL 42 03/06/2021 0823   HDL 36 (L) 10/22/2014 0810   HDL 43 02/02/2013 0843   CHOLHDL 4.0 03/06/2021 0823   LDLCALC 105 (H) 03/06/2021 0823   LDLCALC 100 (H) 03/12/2014 1600   LDLCALC 92 02/02/2013 0843    Hepatic Function Latest Ref Rng & Units 03/06/2021 11/30/2020 08/25/2020  Total Protein 6.0 - 8.5 g/dL 7.2 7.5 7.5  Albumin 3.8 - 4.8 g/dL 4.5 4.7 4.7  AST 0 - 40 IU/L _0 ALT 0 - 44 IU/L _1 Alk Phosphatase 44 - 121 IU/L 54 43(L) 45  Total Bilirubin 0.0 - 1.2 mg/dL 0.3 0.4 0.3    Lab Results  Component Value Date/Time   TSH 2.501 02/02/2013 08:43 AM  CBC Latest Ref Rng & Units 03/06/2021 05/19/2020 07/28/2019  WBC 3.4 - 10.8 x10E3/uL 7.5 7.6 7.5  Hemoglobin 13.0 - 17.7 g/dL 14.9 15.0 15.3  Hematocrit 37.5 - 51.0 % 44.9 44.7 44.7  Platelets 150 - 450 x10E3/uL 266 253 239    Lab Results  Component Value Date/Time   VD25OH 42 02/02/2013 08:43 AM    Clinical ASCVD: No  The 10-year  ASCVD risk score (Arnett DK, et al., 2019) is: 37%   Values used to calculate the score:     Age: 41 years     Sex: Male     Is Non-Hispanic African American: No     Diabetic: Yes     Tobacco smoker: No     Systolic Blood Pressure: 287 mmHg     Is BP treated: Yes     HDL Cholesterol: 42 mg/dL     Total Cholesterol: 168 mg/dL    Other: (CHADS2VASc if Afib, PHQ9 if depression, MMRC or CAT for COPD, ACT, DEXA)  Social History   Tobacco Use  Smoking Status Former   Types: Cigarettes   Quit date: 08/06/1962   Years since quitting: 58.8  Smokeless Tobacco Never   BP Readings from Last 3 Encounters:  03/09/21 136/64  12/01/20 139/67  05/20/20 121/68   Pulse Readings from Last 3 Encounters:  03/09/21 89  12/01/20 67  05/20/20 63   Wt Readings from Last 3 Encounters:  03/09/21 187 lb (84.8 kg)  12/01/20 186 lb (84.4 kg)  05/20/20 183 lb (83 kg)    Assessment: Review of patient past medical history, allergies, medications, health status, including review of consultants reports, laboratory and other test data, was performed as part of comprehensive evaluation and provision of chronic care management services.   SDOH:  (Social Determinants of Health) assessments and interventions performed:    CCM Care Plan  Allergies  Allergen Reactions   Doxycycline    Sulfa Antibiotics Rash    Medications Reviewed Today     Reviewed by Lavera Guise, [facility] (Pharmacist) on 04/25/21 at [place] Status: <None>   Medication Order Taking? Sig Documenting Provider Last Dose Status Informant  Accu-Chek Softclix Lancets lancets [id number] No TEST BLOOD SUGAR TWICE DAILY Dx E11.9 [name], Fransisca Kaufmann, MD Taking Active   Alcohol Swabs (B-D SINGLE USE SWABS REGULAR) PADS [id number] No USE AS DIRECTED TWICE DAILY Dx E11.69 [name], Fransisca Kaufmann, MD Taking Active   aspirin 81 MG tablet [id number] No Take 81 mg by mouth daily. [provider] Taking Active   blood glucose meter kit and  supplies KIT [id number] No Accucheck Meter. Check BS BID and prn Dx Code E11.9 [name], Fransisca Kaufmann, MD Taking Active   Blood Glucose Monitoring Suppl (TRUE METRIX AIR GLUCOSE METER) w/Device KIT [id number] No Test BS BID Dx E11.9 [name], Fransisca Kaufmann, MD Taking Active   Blood Glucose Monitoring Suppl (TRUE METRIX METER) w/Device KIT [id number] No USE AS DIRECTED [name], Fransisca Kaufmann, MD Taking Active   Cholecalciferol (VITAMIN D) 2000 UNITS tablet [id number] No Take 2,000 Units by mouth daily. [provider] Taking Active            Med Note Blanca Friend, Royce Macadamia   Fri Nov 20, 2019  8:34 AM) PAP   empagliflozin (JARDIANCE) 25 MG TABS tablet [id number] No Take 1 tablet (25 mg total) by mouth daily before breakfast. [name], Fransisca Kaufmann, MD Taking Active            Med Note (Brenda Samano,  Th[place] Siefert D   Tue Apr 25, 2021  9:25 AM) [place] Patient assistance program--ships to patient's home  fenofibrate 160 MG tablet [id number] No TAKE 1 TABLET EVERY DAY [name], Fransisca Kaufmann, MD Taking Active   fish oil-omega-3 fatty acids 1000 MG capsule [id number] No Take 1 g by mouth daily. [provider] Taking Active   glipiZIDE (GLUCOTROL XL) 10 MG 24 hr tablet [id number] No Take 1 tablet (10 mg total) by mouth daily with breakfast. [name], Fransisca Kaufmann, MD Taking Active   glucose blood (ACCU-CHEK AVIVA PLUS) test strip [id number] No TEST BLOOD SUGAR TWICE DAILY [name], Fransisca Kaufmann, MD Taking Active   lisinopril-hydrochlorothiazide (ZESTORETIC) 20-12.5 MG tablet [id number] No TAKE 2 TABLETS EVERY DAY. [name], Fransisca Kaufmann, MD Taking Active   metFORMIN (GLUCOPHAGE) 1000 MG tablet [id number] No TAKE 1 TABLET TWICE DAILY WITH MEALS. [name], Fransisca Kaufmann, MD Taking Active   Multiple Vitamins-Minerals (MENS 50+ ADVANCED PO) [id number] No Take by mouth. [provider] Taking Active   nitroGLYCERIN (NITROSTAT) 0.4 MG SL tablet [id number] No Place 1 tablet (0.4 mg total) under the tongue every 5 (five)  minutes as needed. [name], Fransisca Kaufmann, MD Taking Active   rosuvastatin (CRESTOR) 40 MG tablet [id number] No Take 1 tablet (40 mg total) by mouth daily. [name], Fransisca Kaufmann, MD Taking Active   Semaglutide 14 MG TABS [id number] No Take 14 mg by mouth daily. 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 03/02/2016   Abscess of hand, left 02/20/2016   BMI 28.0-28.9,adult    Hyperlipidemia associated with type 2 diabetes mellitus ([place]) 12/19/2010   Hypertension associated with diabetes ([place]) 12/19/2010   Type II diabetes mellitus ([place]) 12/19/2010   Joint pain 12/19/2010    Immunization History  Administered Date(s) Administered   Fluad Quad(high Dose 65+) 04/29/2019, 05/20/2020   Influenza, High Dose Seasonal PF 04/27/2018   Influenza,inj,Quad PF,6+ Mos 04/30/2015, 04/20/2017   Influenza-Unspecified 05/20/2016   PFIZER(Purple Top)SARS-COV-2 Vaccination 10/07/2019, 10/28/2019   Pneumococcal Conjugate-13 08/26/2017, 08/26/2017   Pneumococcal Polysaccharide-23 05/06/2001, 05/06/2001, 05/20/2020   Td 08/06/2005, 06/29/2013   Tdap 08/21/2019   Zoster, Live 10/28/2013    Conditions to be addressed/monitored: DMII  Care Plan : PHARMD MEDICATION MANAGEMENT  Updates made by Lavera Guise, [place] since 05/26/2021 12:00 AM     Problem: DISEASE PROGRESSION PREVENTION      Long-Range Goal: T2DM   Recent Progress: Not on track  Priority: High  Note:   Current Barriers:  Unable to independently afford treatment regimen Unable to achieve control of T2DM   Pharmacist Clinical Goal(s):  Over the next 180 days, patient will  verbalize ability to afford treatment regimen achieve control of T2DM as evidenced by IMPROVED GLYCEMIC CONTROL/GOAL A1C  through collaboration with PharmD and provider.   Interventions: 1:1 collaboration with [name], Fransisca Kaufmann, MD regarding development and update of comprehensive plan of care as evidenced by provider attestation and co-signature Inter-disciplinary care team collaboration (see longitudinal plan of care) Comprehensive medication review performed;  medication list updated in electronic medical record  Diabetes: Uncontrolled-A1C 7.5%, GFR 65;  Current treatment: Metformin 1g BID Rybelsus 90m daily Via [employer] patient assistance program (last shipment arrived 05/25/21 to PCP office) Jardiance 21mdaily Via [employer] patient assistance program Glipizide 1027mL daily  Of note, patient does not wish to go on injectable--we have discussed adding/switching to if A1c still not controlled at f/u PCP appt Current glucose readings: fasting glucose: <160, post prandial glucose: <200 Denies hypoglycemic/hyperglycemic symptoms Current meal patterns: doesn't have a large appetite on Rybelsus 57m33mily Discussed meal planning options and Plate method for healthy eating Avoid sugary drinks and desserts Incorporate balanced protein, non starchy veggies, 1 serving of carbohydrate with each meal Increase water intake Increase physical activity as able Current exercise: works/active outdoors Educated on diabetes (making additional lifestyle changes) and taking medications as prescribed Collaborated with PCP--will not change therapy at this time; recheck A1c; patient does not wish to have an injectable medications; continue all medications as prescribed Assessed patient finances. Patient enrolled in [employer] Assistance for [place] Assistance for Jard[employer] Warneratient Goals/Self-Care Activities Over the next 180 days,  patient will:  - take medications as prescribed check glucose 3X WEEKLY OR IF SYMPTOMATIC, document, and provide at future appointments collaborate with provider on medication access solutions  Follow Up Plan: Telephone follow up appointment with care management team member scheduled for: 1 MONTH      Medication Assistance:  RYBERSWNIOEVained through [employer] assistance program.  Enrollment ends 08/05/21 JARDIANCE OBTAINED VIA [employer] PATIENT ASSISTANCE  [facility] pharmacy is:  [facility] [id number]: [phone]: [id number]: [phone]: [phone] - [id number] [id number]: [phone]: [phone] pill box? No - N/A-WIFE ENSURES COMPLIANCE Pt endorses 100% compliance  Follow Up:  Patient agrees to Care Plan and Follow-up.  Plan: Telephone follow up appointment with care management team member scheduled for:  07/2021   JuliRegina EckarmD, BCPS Clinical Pharmacist, [place] Phone [phone]

## 2021-06-05 DIAGNOSIS — E1169 Type 2 diabetes mellitus with other specified complication: Secondary | ICD-10-CM

## 2021-06-05 DIAGNOSIS — E785 Hyperlipidemia, unspecified: Secondary | ICD-10-CM

## 2021-06-07 ENCOUNTER — Telehealth: Payer: Self-pay | Admitting: Family Medicine

## 2021-06-07 ENCOUNTER — Other Ambulatory Visit (INDEPENDENT_AMBULATORY_CARE_PROVIDER_SITE_OTHER): Payer: Medicare HMO

## 2021-06-07 DIAGNOSIS — E1169 Type 2 diabetes mellitus with other specified complication: Secondary | ICD-10-CM

## 2021-06-07 DIAGNOSIS — E1159 Type 2 diabetes mellitus with other circulatory complications: Secondary | ICD-10-CM

## 2021-06-07 DIAGNOSIS — E785 Hyperlipidemia, unspecified: Secondary | ICD-10-CM

## 2021-06-07 DIAGNOSIS — I152 Hypertension secondary to endocrine disorders: Secondary | ICD-10-CM

## 2021-06-07 NOTE — Telephone Encounter (Signed)
Pt would like to come in to get labs before his appt. Please add and call to let him know they are added.

## 2021-06-07 NOTE — Telephone Encounter (Signed)
Left message informing Hope that lab orders have been placed.

## 2021-06-13 ENCOUNTER — Other Ambulatory Visit: Payer: Self-pay

## 2021-06-13 ENCOUNTER — Other Ambulatory Visit: Payer: Medicare HMO

## 2021-06-13 DIAGNOSIS — E1159 Type 2 diabetes mellitus with other circulatory complications: Secondary | ICD-10-CM | POA: Diagnosis not present

## 2021-06-13 DIAGNOSIS — I152 Hypertension secondary to endocrine disorders: Secondary | ICD-10-CM

## 2021-06-13 DIAGNOSIS — E785 Hyperlipidemia, unspecified: Secondary | ICD-10-CM | POA: Diagnosis not present

## 2021-06-13 DIAGNOSIS — E1169 Type 2 diabetes mellitus with other specified complication: Secondary | ICD-10-CM

## 2021-06-13 LAB — CMP14+EGFR
ALT: 29 IU/L (ref 0–44)
AST: 29 IU/L (ref 0–40)
Albumin/Globulin Ratio: 2 (ref 1.2–2.2)
Albumin: 4.7 g/dL (ref 3.8–4.8)
Alkaline Phosphatase: 43 IU/L — ABNORMAL LOW (ref 44–121)
BUN/Creatinine Ratio: 13 (ref 10–24)
BUN: 17 mg/dL (ref 8–27)
Bilirubin Total: 0.5 mg/dL (ref 0.0–1.2)
CO2: 26 mmol/L (ref 20–29)
Calcium: 9.6 mg/dL (ref 8.6–10.2)
Chloride: 98 mmol/L (ref 96–106)
Creatinine, Ser: 1.29 mg/dL — ABNORMAL HIGH (ref 0.76–1.27)
Globulin, Total: 2.3 g/dL (ref 1.5–4.5)
Glucose: 110 mg/dL — ABNORMAL HIGH (ref 70–99)
Potassium: 3.6 mmol/L (ref 3.5–5.2)
Sodium: 138 mmol/L (ref 134–144)
Total Protein: 7 g/dL (ref 6.0–8.5)
eGFR: 60 mL/min/{1.73_m2} (ref >59)

## 2021-06-13 LAB — CBC WITH DIFFERENTIAL/PLATELET
Basophils Absolute: 0.1 10*3/uL (ref 0.0–0.2)
Basos: 1 %
EOS (ABSOLUTE): 0.5 10*3/uL — ABNORMAL HIGH (ref 0.0–0.4)
Eos: 7 %
Hematocrit: 43.6 % (ref 37.5–51.0)
Hemoglobin: 14.9 g/dL (ref 13.0–17.7)
Immature Grans (Abs): 0.1 10*3/uL (ref 0.0–0.1)
Immature Granulocytes: 1 %
Lymphocytes Absolute: 2.5 10*3/uL (ref 0.7–3.1)
Lymphs: 35 %
MCH: 29.7 pg (ref 26.6–33.0)
MCHC: 34.2 g/dL (ref 31.5–35.7)
MCV: 87 fL (ref 79–97)
Monocytes Absolute: 0.6 10*3/uL (ref 0.1–0.9)
Monocytes: 9 %
Neutrophils Absolute: 3.4 10*3/uL (ref 1.4–7.0)
Neutrophils: 47 %
Platelets: 229 10*3/uL (ref 150–450)
RBC: 5.01 x10E6/uL (ref 4.14–5.80)
RDW: 13.7 % (ref 11.6–15.4)
WBC: 7.1 10*3/uL (ref 3.4–10.8)

## 2021-06-13 LAB — LIPID PANEL
Chol/HDL Ratio: 4.1 ratio (ref 0.0–5.0)
Cholesterol, Total: 161 mg/dL (ref 100–199)
HDL: 39 mg/dL — ABNORMAL LOW (ref >39)
LDL Chol Calc (NIH): 99 mg/dL (ref 0–99)
Triglycerides: 128 mg/dL (ref 0–149)
VLDL Cholesterol Cal: 23 mg/dL (ref 5–40)

## 2021-06-13 LAB — BAYER DCA HB A1C WAIVED: HB A1C (BAYER DCA - WAIVED): 7.3 % — ABNORMAL HIGH (ref 4.8–5.6)

## 2021-06-15 ENCOUNTER — Encounter: Payer: Self-pay | Admitting: Family Medicine

## 2021-06-15 ENCOUNTER — Other Ambulatory Visit: Payer: Self-pay

## 2021-06-15 ENCOUNTER — Ambulatory Visit (INDEPENDENT_AMBULATORY_CARE_PROVIDER_SITE_OTHER): Payer: Medicare HMO | Admitting: Family Medicine

## 2021-06-15 VITALS — BP 139/61 | HR 68 | Ht 69.0 in | Wt 181.0 lb

## 2021-06-15 DIAGNOSIS — I152 Hypertension secondary to endocrine disorders: Secondary | ICD-10-CM

## 2021-06-15 DIAGNOSIS — E1159 Type 2 diabetes mellitus with other circulatory complications: Secondary | ICD-10-CM

## 2021-06-15 DIAGNOSIS — M7521 Bicipital tendinitis, right shoulder: Secondary | ICD-10-CM

## 2021-06-15 DIAGNOSIS — E785 Hyperlipidemia, unspecified: Secondary | ICD-10-CM

## 2021-06-15 DIAGNOSIS — Z23 Encounter for immunization: Secondary | ICD-10-CM | POA: Diagnosis not present

## 2021-06-15 DIAGNOSIS — E1169 Type 2 diabetes mellitus with other specified complication: Secondary | ICD-10-CM

## 2021-06-15 MED ORDER — METFORMIN HCL 1000 MG PO TABS: 1000.0000 mg | ORAL_TABLET | Freq: Two times a day (BID) | ORAL | 3 refills | Status: DC

## 2021-06-15 MED ORDER — LISINOPRIL-HYDROCHLOROTHIAZIDE 20-12.5 MG PO TABS: 2.0000 | ORAL_TABLET | Freq: Every day | ORAL | 3 refills | Status: DC

## 2021-06-15 MED ORDER — FENOFIBRATE 160 MG PO TABS
160.0000 mg | ORAL_TABLET | Freq: Every day | ORAL | 3 refills | Status: DC
Start: 2021-06-15 — End: 2022-04-20

## 2021-06-15 MED ORDER — NITROGLYCERIN 0.4 MG SL SUBL: 0.4000 mg | SUBLINGUAL_TABLET | SUBLINGUAL | 1 refills | Status: AC | PRN

## 2021-06-15 NOTE — Progress Notes (Signed)
BP 139/61   Pulse 68   Ht 5' 9"  (1.753 m)   Wt 181 lb (82.1 kg)   SpO2 96%   BMI 26.73 kg/m    Subjective:   Patient ID: Philip Huang, male    DOB: November 20, 1950, 70 y.o.   MRN: [id number]  HPI: Philip Huang is a 70 y.o. male presenting on 06/15/2021 for Medical Management of Chronic Issues and Hyperlipidemia   HPI Type 2 diabetes mellitus Patient comes in today for recheck of his diabetes. Patient has been currently taking Jardiance and glipizide and metformin and Rybelsus. Patient is currently on an ACE inhibitor/ARB. Patient has seen an ophthalmologist this year. Patient denies any issues with their feet. The symptom started onset as an adult hyperlipidemia and hypertension ARE RELATED TO DM   Hyperlipidemia Patient is coming in for recheck of his hyperlipidemia. The patient is currently taking Crestor. They deny any issues with myalgias or history of liver damage from it. They deny any focal numbness or weakness or chest pain.   Hypertension Patient is currently on lisinopril hydrochlorothiazide, and their blood pressure today is 139/61. Patient denies any lightheadedness or dizziness. Patient denies headaches, blurred vision, chest pains, shortness of breath, or weakness. Denies any side effects from medication and is content with current medication.   Patient complains of right anterior shoulder pain is been bothering him over the past few weeks.  He says irritated and at first when he was chopping some wood and then it got better but since then it started bothering him some more again.  He says he did something just couple days ago that irritated again.  It does hurt more on the anterior aspect and hurts when he sleeps on it at night and that is probably the worst time that it hurts him.  Patient denies any numbness or weakness through the daytime and seems to do okay.  Relevant past medical, surgical, family and social history reviewed and updated as indicated. Interim  medical history since our last visit reviewed. Allergies and medications reviewed and updated.  Review of Systems  Constitutional:  Negative for chills and fever.  Respiratory:  Negative for shortness of breath and wheezing.   Cardiovascular:  Negative for chest pain and leg swelling.  Musculoskeletal:  Positive for arthralgias. Negative for back pain and gait problem.  Skin:  Negative for rash.  All other systems reviewed and are negative.  Per HPI unless specifically indicated above   Allergies as of 06/15/2021       Reactions   Doxycycline    Sulfa Antibiotics Rash        Medication List        Accurate as of June 15, 2021  9:16 AM. If you have any questions, ask your nurse or doctor.          Accu-Chek Aviva Plus test strip Generic drug: glucose blood TEST BLOOD SUGAR TWICE DAILY   Accu-Chek Softclix Lancets lancets TEST BLOOD SUGAR TWICE DAILY Dx E11.9   TRUEplus Lancets 33G Misc TEST BLOOD SUGAR TWICE DAILY Dx E11.9   aspirin 81 MG tablet Take 81 mg by mouth daily.   B-D SINGLE USE SWABS REGULAR Pads USE AS DIRECTED TWICE DAILY Dx E11.69   empagliflozin 25 MG Tabs tablet Commonly known as: Jardiance Take 1 tablet (25 mg total) by mouth daily before breakfast.   fenofibrate 160 MG tablet Take 1 tablet (160 mg total) by mouth daily.   fish oil-omega-3 fatty acids 1000  MG capsule Take 1 g by mouth daily.   glipiZIDE 10 MG 24 hr tablet Commonly known as: GLUCOTROL XL Take 1 tablet (10 mg total) by mouth daily with breakfast.   lisinopril-hydrochlorothiazide 20-12.5 MG tablet Commonly known as: ZESTORETIC Take 2 tablets by mouth daily.   MENS 50+ ADVANCED PO Take by mouth.   metFORMIN 1000 MG tablet Commonly known as: GLUCOPHAGE Take 1 tablet (1,000 mg total) by mouth 2 (two) times daily with a meal.   nitroGLYCERIN 0.4 MG SL tablet Commonly known as: NITROSTAT Place 1 tablet (0.4 mg total) under the tongue every 5 (five) minutes as  needed.   rosuvastatin 40 MG tablet Commonly known as: CRESTOR Take 1 tablet (40 mg total) by mouth daily.   Semaglutide 14 MG Tabs Take 14 mg by mouth daily.   True Metrix Air Glucose Meter w/Device Kit Test BS BID Dx E11.9   True Metrix Meter w/Device Kit USE AS DIRECTED   Vitamin D 50 MCG (2000 UT) tablet Take 2,000 Units by mouth daily.         Objective:   BP 139/61   Pulse 68   Ht 5' 9"  (1.753 m)   Wt 181 lb (82.1 kg)   SpO2 96%   BMI 26.73 kg/m   Wt Readings from Last 3 Encounters:  06/15/21 181 lb (82.1 kg)  03/09/21 187 lb (84.8 kg)  12/01/20 186 lb (84.4 kg)    Physical Exam Vitals and nursing note reviewed.  Constitutional:      General: He is not in acute distress.    Appearance: He is well-developed. He is not diaphoretic.  Eyes:     General: No scleral icterus.       Right eye: No discharge.     Conjunctiva/sclera: Conjunctivae normal.     Pupils: Pupils are equal, round, and reactive to light.  Neck:     Thyroid: No thyromegaly.  Cardiovascular:     Rate and Rhythm: Normal rate and regular rhythm.     Heart sounds: Normal heart sounds. No murmur heard. Pulmonary:     Effort: Pulmonary effort is normal. No respiratory distress.     Breath sounds: Normal breath sounds. No wheezing.  Musculoskeletal:        General: Normal range of motion.     Right shoulder: Tenderness (Anterior of her biceps tendon) present. No swelling, deformity, bony tenderness or crepitus.     Cervical back: Neck supple.  Lymphadenopathy:     Cervical: No cervical adenopathy.  Skin:    General: Skin is warm and dry.     Findings: No rash.  Neurological:     Mental Status: He is alert and oriented to person, place, and time.     Coordination: Coordination normal.  Psychiatric:        Behavior: Behavior normal.    Results for orders placed or performed in visit on 06/13/21  Bayer DCA Hb A1c Waived  Result Value Ref Range   HB A1C (BAYER DCA - WAIVED) 7.3 (H)  4.8 - 5.6 %  Lipid panel  Result Value Ref Range   Cholesterol, Total 161 100 - 199 mg/dL   Triglycerides 128 0 - 149 mg/dL   HDL 39 (L) >39 mg/dL   VLDL Cholesterol Cal 23 5 - 40 mg/dL   LDL Chol Calc (NIH) 99 0 - 99 mg/dL   Chol/HDL Ratio 4.1 0.0 - 5.0 ratio  CMP14+EGFR  Result Value Ref Range   Glucose 110 (H) 70 -  99 mg/dL   BUN 17 8 - 27 mg/dL   Creatinine, Ser 1.29 (H) 0.76 - 1.27 mg/dL   eGFR 60 >59 mL/min/1.73   BUN/Creatinine Ratio 13 10 - 24   Sodium 138 134 - 144 mmol/L   Potassium 3.6 3.5 - 5.2 mmol/L   Chloride 98 96 - 106 mmol/L   CO2 26 20 - 29 mmol/L   Calcium 9.6 8.6 - 10.2 mg/dL   Total Protein 7.0 6.0 - 8.5 g/dL   Albumin 4.7 3.8 - 4.8 g/dL   Globulin, Total 2.3 1.5 - 4.5 g/dL   Albumin/Globulin Ratio 2.0 1.2 - 2.2   Bilirubin Total 0.5 0.0 - 1.2 mg/dL   Alkaline Phosphatase 43 (L) 44 - 121 IU/L   AST 29 0 - 40 IU/L   ALT 29 0 - 44 IU/L  CBC with Differential/Platelet  Result Value Ref Range   WBC 7.1 3.4 - 10.8 x10E3/uL   RBC 5.01 4.14 - 5.80 x10E6/uL   Hemoglobin 14.9 13.0 - 17.7 g/dL   Hematocrit 43.6 37.5 - 51.0 %   MCV 87 79 - 97 fL   MCH 29.7 26.6 - 33.0 pg   MCHC 34.2 31.5 - 35.7 g/dL   RDW 13.7 11.6 - 15.4 %   Platelets 229 150 - 450 x10E3/uL   Neutrophils 47 Not Estab. %   Lymphs 35 Not Estab. %   Monocytes 9 Not Estab. %   Eos 7 Not Estab. %   Basos 1 Not Estab. %   Neutrophils Absolute 3.4 1.4 - 7.0 x10E3/uL   Lymphocytes Absolute 2.5 0.7 - 3.1 x10E3/uL   Monocytes Absolute 0.6 0.1 - 0.9 x10E3/uL   EOS (ABSOLUTE) 0.5 (H) 0.0 - 0.4 x10E3/uL   Basophils Absolute 0.1 0.0 - 0.2 x10E3/uL   Immature Granulocytes 1 Not Estab. %   Immature Grans (Abs) 0.1 0.0 - 0.1 x10E3/uL    Assessment & Plan:   Problem List Items Addressed This Visit       Cardiovascular and Mediastinum   Hypertension associated with diabetes ([place]) - Primary   Relevant Medications   fenofibrate 160 MG tablet   lisinopril-hydrochlorothiazide (ZESTORETIC) 20-12.5  MG tablet   metFORMIN (GLUCOPHAGE) 1000 MG tablet   nitroGLYCERIN (NITROSTAT) 0.4 MG SL tablet     Endocrine   Type II diabetes mellitus ([facility]) (Chronic)   Relevant Medications   lisinopril-hydrochlorothiazide (ZESTORETIC) 20-12.5 MG tablet   metFORMIN (GLUCOPHAGE) 1000 MG tablet   Other Relevant Orders   Bayer DCA Hb A1c Waived   Hyperlipidemia associated with type 2 diabetes mellitus (HCC)   Relevant Medications   fenofibrate 160 MG tablet   lisinopril-hydrochlorothiazide (ZESTORETIC) 20-12.5 MG tablet   metFORMIN (GLUCOPHAGE) 1000 MG tablet   nitroGLYCERIN (NITROSTAT) 0.4 MG SL tablet   Other Visit Diagnoses     Biceps tendinitis of right upper extremity         Continue current medicine, seems to be doing well.  Follow-up in 3 months  Follow up plan: Return if symptoms worsen or fail to improve, for Diabetes and hypertension recheck 3 months.  Counseling provided for all of the vaccine components Orders Placed This Encounter  Procedures   Bayer [place] Hb A1c [place] Mija Effertz, MD [facility] 06/15/2021, 9:16 AM

## 2021-06-21 ENCOUNTER — Other Ambulatory Visit: Payer: Self-pay | Admitting: Family Medicine

## 2021-06-21 ENCOUNTER — Telehealth: Payer: Medicare HMO

## 2021-07-19 NOTE — Progress Notes (Signed)
Received notification from [place] [employer]) regarding RE ENROLLMENT approval for [employer]. Patient assistance approved from 08/06/21 to 08/05/22.  MEDICATION WILL SHIP TO PT'S HOME. REFILLS MUST BE DONE BY PT OR CAN BE CALLED IN BY [employer].  Phone: [phone]

## 2021-08-01 ENCOUNTER — Telehealth: Payer: Self-pay | Admitting: Family Medicine

## 2021-08-01 NOTE — Telephone Encounter (Signed)
Received notification from [place] [employer]) regarding RE ENROLLMENT approval for [employer]. Patient assistance approved from 08/06/21 to 08/05/22.   MEDICATION WILL SHIP TO PT'S HOME. REFILLS MUST BE DONE BY PT OR CAN BE CALLED IN BY [employer].   Phone: [phone]   The above was copy and pasted from Clinical Pharmacist on 07/19/2021. Pt made aware and voiced understanding.

## 2021-08-16 NOTE — Telephone Encounter (Signed)
Will close encounter

## 2021-08-17 ENCOUNTER — Encounter: Payer: Self-pay | Admitting: Family Medicine

## 2021-08-17 ENCOUNTER — Ambulatory Visit (INDEPENDENT_AMBULATORY_CARE_PROVIDER_SITE_OTHER): Payer: Medicare HMO | Admitting: Family Medicine

## 2021-08-17 ENCOUNTER — Ambulatory Visit: Payer: Medicare HMO | Admitting: Family Medicine

## 2021-08-17 VITALS — BP 135/68 | HR 84 | Temp 98.0°F | Ht 69.0 in | Wt 183.0 lb

## 2021-08-17 DIAGNOSIS — M545 Low back pain, unspecified: Secondary | ICD-10-CM | POA: Diagnosis not present

## 2021-08-17 MED ORDER — TIZANIDINE HCL 4 MG PO TABS: 4.0000 mg | ORAL_TABLET | Freq: Four times a day (QID) | ORAL | 1 refills | Status: DC | PRN

## 2021-08-17 MED ORDER — PREDNISONE 10 MG PO TABS: ORAL_TABLET | ORAL | 0 refills | Status: DC

## 2021-08-17 NOTE — Patient Instructions (Signed)

## 2021-08-17 NOTE — Progress Notes (Signed)
Subjective:  Patient ID: Philip Huang, male    DOB: 10/29/1950  Age: 71 y.o. MRN: [id number]  CC: Back Pain   HPI [name] presents for incresing back pain for 1 week. Has been chopping wood a lot. Got worse yesterday. Located at left L5 level, laterally. Denies radiation. 5/10 severity. Worse with bending forward.  Depression screen [facility] 2/9 08/17/2021 06/15/2021 03/09/2021  Decreased Interest 0 0 0  Down, Depressed, Hopeless 0 0 0  PHQ - 2 Score 0 0 0    History Leston has a past medical history of Diabetes mellitus, HTN (hypertension), Hyperlipidemia, Overweight(278.02), and Squamous acanthoma of upper extremity.   He has a past surgical history that includes None and squamous acanthoma of upper extremity.   His family history includes Diabetes in his brother, mother, and sister; Heart disease (age of onset: 34) in his mother; Hyperlipidemia in his brother, mother, and sister; Hypertension in his brother, mother, and sister; Parkinsonism in his father.He reports that he quit smoking about 59 years ago. His smoking use included cigarettes. He has never used smokeless tobacco. He reports that he does not drink alcohol and does not use drugs.    ROS Review of Systems  Constitutional:  Negative for chills, diaphoresis and fever.  HENT:  Negative for sore throat.   Respiratory:  Negative for shortness of breath.   Cardiovascular:  Negative for chest pain.  Gastrointestinal:  Negative for abdominal pain.  Musculoskeletal:  Positive for arthralgias, back pain, gait problem and myalgias. Negative for neck pain.  Skin:  Negative for rash.  Neurological:  Positive for weakness. Negative for numbness.   Objective:  BP 135/68    Pulse 84    Temp 98 F (36.7 C)    Ht 5' 9"  (1.753 m)    Wt 183 lb (83 kg)    SpO2 95%    BMI 27.02 kg/m   BP Readings from Last 3 Encounters:  08/17/21 135/68  06/15/21 139/61  03/09/21 136/64    Wt Readings from Last 3 Encounters:   08/17/21 183 lb (83 kg)  06/15/21 181 lb (82.1 kg)  03/09/21 187 lb (84.8 kg)     Physical Exam Vitals reviewed.  Constitutional:      General: He is not in acute distress.    Appearance: Normal appearance. He is well-developed.  HENT:     Head: Normocephalic.  Eyes:     Pupils: Pupils are equal, round, and reactive to light.  Cardiovascular:     Rate and Rhythm: Normal rate and regular rhythm.     Heart sounds: Normal heart sounds. No murmur heard. Pulmonary:     Effort: Pulmonary effort is normal.     Breath sounds: Normal breath sounds.  Abdominal:     Tenderness: There is no abdominal tenderness.  Musculoskeletal:        General: Tenderness present.     Cervical back: Normal range of motion.  Skin:    General: Skin is warm and dry.  Neurological:     Mental Status: He is alert and oriented to person, place, and time.     Deep Tendon Reflexes: Reflexes are normal and symmetric.  Psychiatric:        Behavior: Behavior normal.        Thought Content: Thought content normal.      Assessment & Plan:   Maddex was seen today for back pain.  Diagnoses and all orders for this visit:  Lumbar pain  Other  orders -     predniSONE (DELTASONE) 10 MG tablet; Take 5 daily for 2 days followed by 4,3,2 and 1 for 2 days each. -     tiZANidine (ZANAFLEX) 4 MG tablet; Take 1 tablet (4 mg total) by mouth every 6 (six) hours as needed for muscle spasms.       I am having Philip Huang. Hilyard "Dwight" start on predniSONE and tiZANidine. I am also having him maintain his aspirin, Vitamin D, Multiple Vitamins-Minerals (MENS 50+ ADVANCED PO), fish oil-omega-3 fatty acids, True Metrix Air Glucose Meter, Semaglutide, True Metrix Meter, B-D SINGLE USE SWABS REGULAR, Accu-Chek Softclix Lancets, TRUEplus Lancets 33G, glipiZIDE, rosuvastatin, empagliflozin, fenofibrate, lisinopril-hydrochlorothiazide, metFORMIN, nitroGLYCERIN, and Accu-Chek Aviva Plus.  Allergies as of 08/17/2021        Reactions   Doxycycline    Sulfa Antibiotics Rash        Medication List        Accurate as of August 17, 2021  4:22 PM. If you have any questions, ask your nurse or doctor.          Accu-Chek Aviva Plus test strip Generic drug: glucose blood TEST BLOOD SUGAR TWICE DAILY   Accu-Chek Softclix Lancets lancets TEST BLOOD SUGAR TWICE DAILY Dx E11.9   TRUEplus Lancets 33G Misc TEST BLOOD SUGAR TWICE DAILY Dx E11.9   aspirin 81 MG tablet Take 81 mg by mouth daily.   B-D SINGLE USE SWABS REGULAR Pads USE AS DIRECTED TWICE DAILY Dx E11.69   empagliflozin 25 MG Tabs tablet Commonly known as: Jardiance Take 1 tablet (25 mg total) by mouth daily before breakfast.   fenofibrate 160 MG tablet Take 1 tablet (160 mg total) by mouth daily.   fish oil-omega-3 fatty acids 1000 MG capsule Take 1 g by mouth daily.   glipiZIDE 10 MG 24 hr tablet Commonly known as: GLUCOTROL XL Take 1 tablet (10 mg total) by mouth daily with breakfast.   lisinopril-hydrochlorothiazide 20-12.5 MG tablet Commonly known as: ZESTORETIC Take 2 tablets by mouth daily.   MENS 50+ ADVANCED PO Take by mouth.   metFORMIN 1000 MG tablet Commonly known as: GLUCOPHAGE Take 1 tablet (1,000 mg total) by mouth 2 (two) times daily with a meal.   nitroGLYCERIN 0.4 MG SL tablet Commonly known as: NITROSTAT Place 1 tablet (0.4 mg total) under the tongue every 5 (five) minutes as needed.   predniSONE 10 MG tablet Commonly known as: DELTASONE Take 5 daily for 2 days followed by 4,3,2 and 1 for 2 days each. Started by: Claretta Fraise, MD   rosuvastatin 40 MG tablet Commonly known as: CRESTOR Take 1 tablet (40 mg total) by mouth daily.   Semaglutide 14 MG Tabs Take 14 mg by mouth daily.   tiZANidine 4 MG tablet Commonly known as: ZANAFLEX Take 1 tablet (4 mg total) by mouth every 6 (six) hours as needed for muscle spasms. Started by: Claretta Fraise, MD   True Metrix Air Glucose Meter w/Device  Kit Test BS BID Dx E11.9   True Metrix Meter w/Device Kit USE AS DIRECTED   Vitamin D 50 MCG (2000 UT) tablet Take 2,000 Units by mouth daily.       BACK EXERCISE handout given  Follow-up: Return if symptoms worsen or fail to improve.  Claretta Fraise, M.D.

## 2021-08-22 NOTE — Progress Notes (Signed)
Received notification from [place] regarding approval for RYBELSUS. Patient assistance approved from 08/10/21 to 07/05/22.  Phone: [phone]

## 2021-09-14 ENCOUNTER — Telehealth: Payer: Self-pay | Admitting: Family Medicine

## 2021-09-14 NOTE — Telephone Encounter (Signed)
Unfortunately, we are experiencing major shipping delays up to 6 weeks She can call [phone] and speak with a representative to check on shipment I would highly encourage patients to fill at least one month at the local pharmacy if they are able (usually around ~$47)  I will route to our CPhT, Rosendo Gros if she has any additional information   Thank you all!

## 2021-09-14 NOTE — Telephone Encounter (Signed)
Pts wife checking on status of Rybelsus Rx shipment through patient assistance program.

## 2021-09-18 ENCOUNTER — Telehealth: Payer: Self-pay | Admitting: Family Medicine

## 2021-09-18 DIAGNOSIS — E1169 Type 2 diabetes mellitus with other specified complication: Secondary | ICD-10-CM

## 2021-09-18 DIAGNOSIS — E1159 Type 2 diabetes mellitus with other circulatory complications: Secondary | ICD-10-CM

## 2021-09-18 DIAGNOSIS — E785 Hyperlipidemia, unspecified: Secondary | ICD-10-CM

## 2021-09-18 NOTE — Telephone Encounter (Signed)
Future orders placed and pt's wife aware.

## 2021-09-20 ENCOUNTER — Other Ambulatory Visit: Payer: Medicare HMO

## 2021-09-20 DIAGNOSIS — E1159 Type 2 diabetes mellitus with other circulatory complications: Secondary | ICD-10-CM | POA: Diagnosis not present

## 2021-09-20 DIAGNOSIS — E1169 Type 2 diabetes mellitus with other specified complication: Secondary | ICD-10-CM | POA: Diagnosis not present

## 2021-09-20 DIAGNOSIS — I152 Hypertension secondary to endocrine disorders: Secondary | ICD-10-CM | POA: Diagnosis not present

## 2021-09-20 DIAGNOSIS — E785 Hyperlipidemia, unspecified: Secondary | ICD-10-CM | POA: Diagnosis not present

## 2021-09-20 LAB — CBC WITH DIFFERENTIAL/PLATELET
Basophils Absolute: 0.1 10*3/uL (ref 0.0–0.2)
Basos: 1 %
EOS (ABSOLUTE): 0.4 10*3/uL (ref 0.0–0.4)
Eos: 5 %
Hematocrit: 42.5 % (ref 37.5–51.0)
Hemoglobin: 14.6 g/dL (ref 13.0–17.7)
Immature Grans (Abs): 0 10*3/uL (ref 0.0–0.1)
Immature Granulocytes: 0 %
Lymphocytes Absolute: 2.1 10*3/uL (ref 0.7–3.1)
Lymphs: 33 %
MCH: 29.9 pg (ref 26.6–33.0)
MCHC: 34.4 g/dL (ref 31.5–35.7)
MCV: 87 fL (ref 79–97)
Monocytes Absolute: 0.5 10*3/uL (ref 0.1–0.9)
Monocytes: 8 %
Neutrophils Absolute: 3.5 10*3/uL (ref 1.4–7.0)
Neutrophils: 53 %
Platelets: 249 10*3/uL (ref 150–450)
RBC: 4.89 x10E6/uL (ref 4.14–5.80)
RDW: 13.1 % (ref 11.6–15.4)
WBC: 6.6 10*3/uL (ref 3.4–10.8)

## 2021-09-20 LAB — CMP14+EGFR
ALT: 27 IU/L (ref 0–44)
AST: 22 IU/L (ref 0–40)
Albumin/Globulin Ratio: 2 (ref 1.2–2.2)
Albumin: 4.6 g/dL (ref 3.8–4.8)
Alkaline Phosphatase: 55 IU/L (ref 44–121)
BUN/Creatinine Ratio: 18 (ref 10–24)
BUN: 21 mg/dL (ref 8–27)
Bilirubin Total: 0.2 mg/dL (ref 0.0–1.2)
CO2: 25 mmol/L (ref 20–29)
Calcium: 9.5 mg/dL (ref 8.6–10.2)
Chloride: 100 mmol/L (ref 96–106)
Creatinine, Ser: 1.14 mg/dL (ref 0.76–1.27)
Globulin, Total: 2.3 g/dL (ref 1.5–4.5)
Glucose: 120 mg/dL — ABNORMAL HIGH (ref 70–99)
Potassium: 3.9 mmol/L (ref 3.5–5.2)
Sodium: 138 mmol/L (ref 134–144)
Total Protein: 6.9 g/dL (ref 6.0–8.5)
eGFR: 70 mL/min/{1.73_m2} (ref >59)

## 2021-09-20 LAB — LIPID PANEL
Chol/HDL Ratio: 3.7 ratio (ref 0.0–5.0)
Cholesterol, Total: 151 mg/dL (ref 100–199)
HDL: 41 mg/dL (ref >39)
LDL Chol Calc (NIH): 89 mg/dL (ref 0–99)
Triglycerides: 114 mg/dL (ref 0–149)
VLDL Cholesterol Cal: 21 mg/dL (ref 5–40)

## 2021-09-20 LAB — BAYER DCA HB A1C WAIVED: HB A1C (BAYER DCA - WAIVED): 7.8 % — ABNORMAL HIGH (ref 4.8–5.6)

## 2021-09-21 ENCOUNTER — Encounter: Payer: Self-pay | Admitting: Family Medicine

## 2021-09-21 ENCOUNTER — Ambulatory Visit (INDEPENDENT_AMBULATORY_CARE_PROVIDER_SITE_OTHER): Payer: Medicare HMO | Admitting: Family Medicine

## 2021-09-21 VITALS — BP 151/66 | HR 80 | Ht 69.0 in | Wt 182.2 lb

## 2021-09-21 DIAGNOSIS — E1159 Type 2 diabetes mellitus with other circulatory complications: Secondary | ICD-10-CM | POA: Diagnosis not present

## 2021-09-21 DIAGNOSIS — Z23 Encounter for immunization: Secondary | ICD-10-CM | POA: Diagnosis not present

## 2021-09-21 DIAGNOSIS — E1169 Type 2 diabetes mellitus with other specified complication: Secondary | ICD-10-CM

## 2021-09-21 DIAGNOSIS — E785 Hyperlipidemia, unspecified: Secondary | ICD-10-CM | POA: Diagnosis not present

## 2021-09-21 DIAGNOSIS — I152 Hypertension secondary to endocrine disorders: Secondary | ICD-10-CM | POA: Diagnosis not present

## 2021-09-21 NOTE — Addendum Note (Signed)
Addended by: Colman Cater on: 09/21/2021 04:35 PM   Modules accepted: Orders

## 2021-09-21 NOTE — Progress Notes (Signed)
BP (!) 151/66    Pulse 80    Ht 5' 9"  (1.753 m)    Wt 182 lb 3.2 oz (82.6 kg)    SpO2 96%    BMI 26.91 kg/m    Subjective:   Patient ID: Philip Huang, male    DOB: 24-Apr-1951, 71 y.o.   MRN: [id number]  HPI: Philip Huang is a 71 y.o. male presenting on 09/21/2021 for Diabetes, Hypertension, and Medication Management (3 month check up)   HPI Type 2 diabetes mellitus Patient comes in today for recheck of his diabetes. Patient has been currently taking glipizide and metformin and Rybelsus. Patient is currently on an ACE inhibitor/ARB. Patient has not seen an ophthalmologist this year. Patient denies any issues with their feet. The symptom started onset as an adult hypertension and hyperlipidemia ARE RELATED TO DM.  Patient's blood sugar is up but he did have a course of steroids for 10 days within the past month that kicked his blood sugars up.  Hypertension Patient is currently on lisinopril hydrochlorothiazide, and their blood pressure today is 151/66. Patient denies any lightheadedness or dizziness. Patient denies headaches, blurred vision, chest pains, shortness of breath, or weakness. Denies any side effects from medication and is content with current medication.   Hyperlipidemia Patient is coming in for recheck of his hyperlipidemia. The patient is currently taking fenofibrate and fish oils and Crestor. They deny any issues with myalgias or history of liver damage from it. They deny any focal numbness or weakness or chest pain.   Relevant past medical, surgical, family and social history reviewed and updated as indicated. Interim medical history since our last visit reviewed. Allergies and medications reviewed and updated.  Review of Systems  Constitutional:  Negative for chills and fever.  Respiratory:  Negative for shortness of breath and wheezing.   Cardiovascular:  Negative for chest pain and leg swelling.  Musculoskeletal:  Negative for back pain and gait problem.   Skin:  Negative for rash.  Neurological:  Negative for dizziness, weakness and light-headedness.  All other systems reviewed and are negative.  Per HPI unless specifically indicated above   Allergies as of 09/21/2021       Reactions   Doxycycline    Sulfa Antibiotics Rash        Medication List        Accurate as of September 21, 2021  4:27 PM. If you have any questions, ask your nurse or doctor.          STOP taking these medications    predniSONE 10 MG tablet Commonly known as: DELTASONE Stopped by: Fransisca Kaufmann Alnita Aybar, MD   tiZANidine 4 MG tablet Commonly known as: ZANAFLEX Stopped by: Fransisca Kaufmann Donivan Thammavong, MD       TAKE these medications    Accu-Chek Aviva Plus test strip Generic drug: glucose blood TEST BLOOD SUGAR TWICE DAILY   Accu-Chek Softclix Lancets lancets TEST BLOOD SUGAR TWICE DAILY Dx E11.9 What changed: Another medication with the same name was removed. Continue taking this medication, and follow the directions you see here. Changed by: Fransisca Kaufmann Kiyara Bouffard, MD   aspirin 81 MG tablet Take 81 mg by mouth daily.   B-D SINGLE USE SWABS REGULAR Pads USE AS DIRECTED TWICE DAILY Dx E11.69   empagliflozin 25 MG Tabs tablet Commonly known as: Jardiance Take 1 tablet (25 mg total) by mouth daily before breakfast.   fenofibrate 160 MG tablet Take 1 tablet (160 mg total) by mouth daily.  fish oil-omega-3 fatty acids 1000 MG capsule Take 1 g by mouth daily.   glipiZIDE 10 MG 24 hr tablet Commonly known as: GLUCOTROL XL Take 1 tablet (10 mg total) by mouth daily with breakfast.   lisinopril-hydrochlorothiazide 20-12.5 MG tablet Commonly known as: ZESTORETIC Take 2 tablets by mouth daily.   MENS 50+ ADVANCED PO Take by mouth.   metFORMIN 1000 MG tablet Commonly known as: GLUCOPHAGE Take 1 tablet (1,000 mg total) by mouth 2 (two) times daily with a meal.   nitroGLYCERIN 0.4 MG SL tablet Commonly known as: NITROSTAT Place 1 tablet (0.4 mg  total) under the tongue every 5 (five) minutes as needed.   rosuvastatin 40 MG tablet Commonly known as: CRESTOR Take 1 tablet (40 mg total) by mouth daily.   Semaglutide 14 MG Tabs Take 14 mg by mouth daily.   True Metrix Air Glucose Meter w/Device Kit Test BS BID Dx E11.9   True Metrix Meter w/Device Kit USE AS DIRECTED   Vitamin D 50 MCG (2000 UT) tablet Take 2,000 Units by mouth daily.         Objective:   BP (!) 151/66    Pulse 80    Ht 5' 9"  (1.753 m)    Wt 182 lb 3.2 oz (82.6 kg)    SpO2 96%    BMI 26.91 kg/m   Wt Readings from Last 3 Encounters:  09/21/21 182 lb 3.2 oz (82.6 kg)  08/17/21 183 lb (83 kg)  06/15/21 181 lb (82.1 kg)    Physical Exam Vitals and nursing note reviewed.  Constitutional:      General: He is not in acute distress.    Appearance: He is well-developed. He is not diaphoretic.  Eyes:     General: No scleral icterus.    Conjunctiva/sclera: Conjunctivae normal.  Neck:     Thyroid: No thyromegaly.  Cardiovascular:     Rate and Rhythm: Normal rate and regular rhythm.     Heart sounds: Normal heart sounds. No murmur heard. Pulmonary:     Effort: Pulmonary effort is normal. No respiratory distress.     Breath sounds: Normal breath sounds. No wheezing.  Musculoskeletal:        General: Normal range of motion.     Cervical back: Neck supple.  Lymphadenopathy:     Cervical: No cervical adenopathy.  Skin:    General: Skin is warm and dry.     Findings: No rash.  Neurological:     Mental Status: He is alert and oriented to person, place, and time.     Coordination: Coordination normal.  Psychiatric:        Behavior: Behavior normal.    Results for orders placed or performed in visit on 09/20/21  Bayer DCA Hb A1c Waived  Result Value Ref Range   HB A1C (BAYER DCA - WAIVED) 7.8 (H) 4.8 - 5.6 %  Lipid panel  Result Value Ref Range   Cholesterol, Total 151 100 - 199 mg/dL   Triglycerides 114 0 - 149 mg/dL   HDL 41 >39 mg/dL   VLDL  Cholesterol Cal 21 5 - 40 mg/dL   LDL Chol Calc (NIH) 89 0 - 99 mg/dL   Chol/HDL Ratio 3.7 0.0 - 5.0 ratio  CMP14+EGFR  Result Value Ref Range   Glucose 120 (H) 70 - 99 mg/dL   BUN 21 8 - 27 mg/dL   Creatinine, Ser 1.14 0.76 - 1.27 mg/dL   eGFR 70 >59 mL/min/1.73   BUN/Creatinine Ratio  18 10 - 24   Sodium 138 134 - 144 mmol/L   Potassium 3.9 3.5 - 5.2 mmol/L   Chloride 100 96 - 106 mmol/L   CO2 25 20 - 29 mmol/L   Calcium 9.5 8.6 - 10.2 mg/dL   Total Protein 6.9 6.0 - 8.5 g/dL   Albumin 4.6 3.8 - 4.8 g/dL   Globulin, Total 2.3 1.5 - 4.5 g/dL   Albumin/Globulin Ratio 2.0 1.2 - 2.2   Bilirubin Total 0.2 0.0 - 1.2 mg/dL   Alkaline Phosphatase 55 44 - 121 IU/L   AST 22 0 - 40 IU/L   ALT 27 0 - 44 IU/L  CBC with Differential/Platelet  Result Value Ref Range   WBC 6.6 3.4 - 10.8 x10E3/uL   RBC 4.89 4.14 - 5.80 x10E6/uL   Hemoglobin 14.6 13.0 - 17.7 g/dL   Hematocrit 42.5 37.5 - 51.0 %   MCV 87 79 - 97 fL   MCH 29.9 26.6 - 33.0 pg   MCHC 34.4 31.5 - 35.7 g/dL   RDW 13.1 11.6 - 15.4 %   Platelets 249 150 - 450 x10E3/uL   Neutrophils 53 Not Estab. %   Lymphs 33 Not Estab. %   Monocytes 8 Not Estab. %   Eos 5 Not Estab. %   Basos 1 Not Estab. %   Neutrophils Absolute 3.5 1.4 - 7.0 x10E3/uL   Lymphocytes Absolute 2.1 0.7 - 3.1 x10E3/uL   Monocytes Absolute 0.5 0.1 - 0.9 x10E3/uL   EOS (ABSOLUTE) 0.4 0.0 - 0.4 x10E3/uL   Basophils Absolute 0.1 0.0 - 0.2 x10E3/uL   Immature Granulocytes 0 Not Estab. %   Immature Grans (Abs) 0.0 0.0 - 0.1 x10E3/uL    Assessment & Plan:   Problem List Items Addressed This Visit       Cardiovascular and Mediastinum   Hypertension associated with diabetes ([place])     Endocrine   Type II diabetes mellitus ([place]) - Primary (Chronic)   Hyperlipidemia associated with type 2 diabetes mellitus ([place])    Continue current medicine, will focus on diet, we will see how he does without the prednisone and see if it comes back down.  A1c was 7.8  today Follow up plan: Return in about 3 months (around 12/19/2021), or if symptoms worsen or fail to improve, for Diabetes and hypertension.  Counseling provided for all of the vaccine components No orders of the defined types were placed in this encounter.   Caryl Pina, MD [facility] 09/21/2021, 4:27 PM

## 2021-10-03 DIAGNOSIS — Z135 Encounter for screening for eye and ear disorders: Secondary | ICD-10-CM | POA: Diagnosis not present

## 2021-10-03 DIAGNOSIS — H52223 Regular astigmatism, bilateral: Secondary | ICD-10-CM | POA: Diagnosis not present

## 2021-10-03 DIAGNOSIS — E119 Type 2 diabetes mellitus without complications: Secondary | ICD-10-CM | POA: Diagnosis not present

## 2021-10-03 DIAGNOSIS — Z961 Presence of intraocular lens: Secondary | ICD-10-CM | POA: Diagnosis not present

## 2021-10-25 ENCOUNTER — Other Ambulatory Visit: Payer: Self-pay | Admitting: Family Medicine

## 2021-10-25 DIAGNOSIS — E1169 Type 2 diabetes mellitus with other specified complication: Secondary | ICD-10-CM

## 2021-12-07 ENCOUNTER — Telehealth: Payer: Self-pay | Admitting: Pharmacist

## 2021-12-07 NOTE — Telephone Encounter (Signed)
RYBELSUS REFILLS SENT TO [facility] ?

## 2021-12-22 ENCOUNTER — Telehealth: Payer: Self-pay | Admitting: Family Medicine

## 2021-12-22 DIAGNOSIS — E1159 Type 2 diabetes mellitus with other circulatory complications: Secondary | ICD-10-CM

## 2021-12-22 DIAGNOSIS — E1169 Type 2 diabetes mellitus with other specified complication: Secondary | ICD-10-CM

## 2021-12-22 NOTE — Telephone Encounter (Signed)
Future lab orders placed

## 2021-12-27 ENCOUNTER — Other Ambulatory Visit: Payer: Medicare HMO

## 2021-12-27 DIAGNOSIS — E1159 Type 2 diabetes mellitus with other circulatory complications: Secondary | ICD-10-CM

## 2021-12-27 DIAGNOSIS — E785 Hyperlipidemia, unspecified: Secondary | ICD-10-CM | POA: Diagnosis not present

## 2021-12-27 DIAGNOSIS — E1169 Type 2 diabetes mellitus with other specified complication: Secondary | ICD-10-CM | POA: Diagnosis not present

## 2021-12-27 DIAGNOSIS — I152 Hypertension secondary to endocrine disorders: Secondary | ICD-10-CM | POA: Diagnosis not present

## 2021-12-27 LAB — BAYER DCA HB A1C WAIVED: HB A1C (BAYER DCA - WAIVED): 7 % — ABNORMAL HIGH (ref 4.8–5.6)

## 2021-12-28 ENCOUNTER — Ambulatory Visit (INDEPENDENT_AMBULATORY_CARE_PROVIDER_SITE_OTHER): Payer: Medicare HMO | Admitting: Family Medicine

## 2021-12-28 ENCOUNTER — Encounter: Payer: Self-pay | Admitting: Family Medicine

## 2021-12-28 VITALS — BP 134/68 | HR 66 | Temp 98.0°F | Ht 69.0 in | Wt 184.0 lb

## 2021-12-28 DIAGNOSIS — E1159 Type 2 diabetes mellitus with other circulatory complications: Secondary | ICD-10-CM | POA: Diagnosis not present

## 2021-12-28 DIAGNOSIS — I152 Hypertension secondary to endocrine disorders: Secondary | ICD-10-CM

## 2021-12-28 DIAGNOSIS — E785 Hyperlipidemia, unspecified: Secondary | ICD-10-CM | POA: Diagnosis not present

## 2021-12-28 DIAGNOSIS — Z23 Encounter for immunization: Secondary | ICD-10-CM | POA: Diagnosis not present

## 2021-12-28 DIAGNOSIS — E1169 Type 2 diabetes mellitus with other specified complication: Secondary | ICD-10-CM

## 2021-12-28 LAB — CBC WITH DIFFERENTIAL/PLATELET
Basophils Absolute: 0.1 10*3/uL (ref 0.0–0.2)
Basos: 1 %
EOS (ABSOLUTE): 0.4 10*3/uL (ref 0.0–0.4)
Eos: 6 %
Hematocrit: 43.4 % (ref 37.5–51.0)
Hemoglobin: 14.7 g/dL (ref 13.0–17.7)
Immature Grans (Abs): 0 10*3/uL (ref 0.0–0.1)
Immature Granulocytes: 0 %
Lymphocytes Absolute: 2.1 10*3/uL (ref 0.7–3.1)
Lymphs: 31 %
MCH: 29.8 pg (ref 26.6–33.0)
MCHC: 33.9 g/dL (ref 31.5–35.7)
MCV: 88 fL (ref 79–97)
Monocytes Absolute: 0.5 10*3/uL (ref 0.1–0.9)
Monocytes: 8 %
Neutrophils Absolute: 3.7 10*3/uL (ref 1.4–7.0)
Neutrophils: 54 %
Platelets: 263 10*3/uL (ref 150–450)
RBC: 4.94 x10E6/uL (ref 4.14–5.80)
RDW: 13.2 % (ref 11.6–15.4)
WBC: 6.8 10*3/uL (ref 3.4–10.8)

## 2021-12-28 LAB — CMP14+EGFR
ALT: 21 IU/L (ref 0–44)
AST: 22 IU/L (ref 0–40)
Albumin/Globulin Ratio: 2 (ref 1.2–2.2)
Albumin: 4.6 g/dL (ref 3.8–4.8)
Alkaline Phosphatase: 47 IU/L (ref 44–121)
BUN/Creatinine Ratio: 17 (ref 10–24)
BUN: 19 mg/dL (ref 8–27)
Bilirubin Total: 0.3 mg/dL (ref 0.0–1.2)
CO2: 24 mmol/L (ref 20–29)
Calcium: 9.7 mg/dL (ref 8.6–10.2)
Chloride: 103 mmol/L (ref 96–106)
Creatinine, Ser: 1.1 mg/dL (ref 0.76–1.27)
Globulin, Total: 2.3 g/dL (ref 1.5–4.5)
Glucose: 131 mg/dL — ABNORMAL HIGH (ref 70–99)
Potassium: 4 mmol/L (ref 3.5–5.2)
Sodium: 144 mmol/L (ref 134–144)
Total Protein: 6.9 g/dL (ref 6.0–8.5)
eGFR: 73 mL/min/{1.73_m2} (ref >59)

## 2021-12-28 LAB — LIPID PANEL
Chol/HDL Ratio: 3.5 ratio (ref 0.0–5.0)
Cholesterol, Total: 151 mg/dL (ref 100–199)
HDL: 43 mg/dL (ref >39)
LDL Chol Calc (NIH): 89 mg/dL (ref 0–99)
Triglycerides: 100 mg/dL (ref 0–149)
VLDL Cholesterol Cal: 19 mg/dL (ref 5–40)

## 2021-12-28 NOTE — Progress Notes (Signed)
BP 134/68   Pulse 66   Temp 98 F (36.7 C)   Ht 5' 9"  (1.753 m)   Wt 184 lb (83.5 kg)   SpO2 95%   BMI 27.17 kg/m    Subjective:   Patient ID: Philip Huang, male    DOB: [birth year], 71 y.o.   MRN: [id number]  HPI: Philip Huang is a 71 y.o. male presenting on 12/28/2021 for Medical Management of Chronic Issues, Hypertension, and Diabetes   HPI Type 2 diabetes mellitus Patient comes in today for recheck of his diabetes. Patient has been currently taking Jardiance and metformin and glipizide and Ozempic and A1c looks better at 7.0. Patient is currently on an ACE inhibitor/ARB. Patient has not seen an ophthalmologist this year. Patient denies any issues with their feet. The symptom started onset as an adult hypertension and hyperlipidemia ARE RELATED TO DM   Hyperlipidemia Patient is coming in for recheck of his hyperlipidemia. The patient is currently taking fish oil and fenofibrate and Crestor. They deny any issues with myalgias or history of liver damage from it. They deny any focal numbness or weakness or chest pain.   Hypertension Patient is currently on lisinopril hydrochlorothiazide, and their blood pressure today is 134/68. Patient denies any lightheadedness or dizziness. Patient denies headaches, blurred vision, chest pains, shortness of breath, or weakness. Denies any side effects from medication and is content with current medication.   Relevant past medical, surgical, family and social history reviewed and updated as indicated. Interim medical history since our last visit reviewed. Allergies and medications reviewed and updated.  Review of Systems  Constitutional:  Negative for chills and fever.  Eyes:  Negative for visual disturbance.  Respiratory:  Negative for shortness of breath and wheezing.   Cardiovascular:  Negative for chest pain and leg swelling.  Musculoskeletal:  Negative for back pain and gait problem.  Skin:  Negative for rash.  Neurological:   Negative for dizziness, weakness and light-headedness.  All other systems reviewed and are negative.  Per HPI unless specifically indicated above   Allergies as of 12/28/2021       Reactions   Doxycycline    Sulfa Antibiotics Rash        Medication List        Accurate as of Dec 28, 2021  3:47 PM. If you have any questions, ask your nurse or doctor.          Accu-Chek Aviva Plus test strip Generic drug: glucose blood TEST BLOOD SUGAR TWICE DAILY   Accu-Chek Softclix Lancets lancets TEST BLOOD SUGAR TWICE DAILY Dx E11.9   aspirin 81 MG tablet Take 81 mg by mouth daily.   DropSafe Alcohol Prep 70 % Pads USE AS DIRECTED TWICE DAILY   empagliflozin 25 MG Tabs tablet Commonly known as: Jardiance Take 1 tablet (25 mg total) by mouth daily before breakfast.   fenofibrate 160 MG tablet Take 1 tablet (160 mg total) by mouth daily.   fish oil-omega-3 fatty acids 1000 MG capsule Take 1 g by mouth daily.   glipiZIDE 10 MG 24 hr tablet Commonly known as: GLUCOTROL XL TAKE 1 TABLET EVERY DAY WITH BREAKFAST   lisinopril-hydrochlorothiazide 20-12.5 MG tablet Commonly known as: ZESTORETIC Take 2 tablets by mouth daily.   MENS 50+ ADVANCED PO Take by mouth.   metFORMIN 1000 MG tablet Commonly known as: GLUCOPHAGE Take 1 tablet (1,000 mg total) by mouth 2 (two) times daily with a meal.   nitroGLYCERIN 0.4 MG SL  tablet Commonly known as: NITROSTAT Place 1 tablet (0.4 mg total) under the tongue every 5 (five) minutes as needed.   rosuvastatin 40 MG tablet Commonly known as: CRESTOR TAKE 1 TABLET EVERY DAY   Semaglutide 14 MG Tabs Take 14 mg by mouth daily.   True Metrix Air Glucose Meter w/Device Kit Test BS BID Dx E11.9   True Metrix Meter w/Device Kit USE AS DIRECTED   Vitamin D 50 MCG (2000 UT) tablet Take 2,000 Units by mouth daily.         Objective:   BP 134/68   Pulse 66   Temp 98 F (36.7 C)   Ht 5' 9"  (1.753 m)   Wt 184 lb (83.5 kg)    SpO2 95%   BMI 27.17 kg/m   Wt Readings from Last 3 Encounters:  12/28/21 184 lb (83.5 kg)  09/21/21 182 lb 3.2 oz (82.6 kg)  08/17/21 183 lb (83 kg)    Physical Exam Vitals and nursing note reviewed.  Constitutional:      General: He is not in acute distress.    Appearance: He is well-developed. He is not diaphoretic.  Eyes:     General: No scleral icterus.    Conjunctiva/sclera: Conjunctivae normal.  Neck:     Thyroid: No thyromegaly.  Cardiovascular:     Rate and Rhythm: Normal rate and regular rhythm.     Heart sounds: Normal heart sounds. No murmur heard. Pulmonary:     Effort: Pulmonary effort is normal. No respiratory distress.     Breath sounds: Normal breath sounds. No wheezing.  Musculoskeletal:        General: No swelling. Normal range of motion.     Cervical back: Neck supple.  Lymphadenopathy:     Cervical: No cervical adenopathy.  Skin:    General: Skin is warm and dry.     Findings: No rash.  Neurological:     Mental Status: He is alert and oriented to person, place, and time.     Coordination: Coordination normal.  Psychiatric:        Behavior: Behavior normal.    Results for orders placed or performed in visit on 12/27/21  Lipid panel  Result Value Ref Range   Cholesterol, Total 151 100 - 199 mg/dL   Triglycerides 100 0 - 149 mg/dL   HDL 43 >39 mg/dL   VLDL Cholesterol Cal 19 5 - 40 mg/dL   LDL Chol Calc (NIH) 89 0 - 99 mg/dL   Chol/HDL Ratio 3.5 0.0 - 5.0 ratio  Bayer DCA Hb A1c Waived  Result Value Ref Range   HB A1C (BAYER DCA - WAIVED) 7.0 (H) 4.8 - 5.6 %  CMP14+EGFR  Result Value Ref Range   Glucose 131 (H) 70 - 99 mg/dL   BUN 19 8 - 27 mg/dL   Creatinine, Ser 1.10 0.76 - 1.27 mg/dL   eGFR 73 >59 mL/min/1.73   BUN/Creatinine Ratio 17 10 - 24   Sodium 144 134 - 144 mmol/L   Potassium 4.0 3.5 - 5.2 mmol/L   Chloride 103 96 - 106 mmol/L   CO2 24 20 - 29 mmol/L   Calcium 9.7 8.6 - 10.2 mg/dL   Total Protein 6.9 6.0 - 8.5 g/dL   Albumin  4.6 3.8 - 4.8 g/dL   Globulin, Total 2.3 1.5 - 4.5 g/dL   Albumin/Globulin Ratio 2.0 1.2 - 2.2   Bilirubin Total 0.3 0.0 - 1.2 mg/dL   Alkaline Phosphatase 47 44 - 121 IU/L  AST 22 0 - 40 IU/L   ALT 21 0 - 44 IU/L  CBC with Differential/Platelet  Result Value Ref Range   WBC 6.8 3.4 - 10.8 x10E3/uL   RBC 4.94 4.14 - 5.80 x10E6/uL   Hemoglobin 14.7 13.0 - 17.7 g/dL   Hematocrit 43.4 37.5 - 51.0 %   MCV 88 79 - 97 fL   MCH 29.8 26.6 - 33.0 pg   MCHC 33.9 31.5 - 35.7 g/dL   RDW 13.2 11.6 - 15.4 %   Platelets 263 150 - 450 x10E3/uL   Neutrophils 54 Not Estab. %   Lymphs 31 Not Estab. %   Monocytes 8 Not Estab. %   Eos 6 Not Estab. %   Basos 1 Not Estab. %   Neutrophils Absolute 3.7 1.4 - 7.0 x10E3/uL   Lymphocytes Absolute 2.1 0.7 - 3.1 x10E3/uL   Monocytes Absolute 0.5 0.1 - 0.9 x10E3/uL   EOS (ABSOLUTE) 0.4 0.0 - 0.4 x10E3/uL   Basophils Absolute 0.1 0.0 - 0.2 x10E3/uL   Immature Granulocytes 0 Not Estab. %   Immature Grans (Abs) 0.0 0.0 - 0.1 x10E3/uL    Assessment & Plan:   Problem List Items Addressed This Visit       Cardiovascular and Mediastinum   Hypertension associated with diabetes ([place])     Endocrine   Type II diabetes mellitus ([facility]) (Chronic)   Hyperlipidemia associated with type 2 diabetes mellitus ([place])   Other Visit Diagnoses     Need for shingles vaccine    -  Primary   Relevant Orders   Varicella-zoster vaccine IM (Shingrix) (Completed)       A1c and blood sugars and blood pressures look good.  No changes Follow up plan: Return in about 3 months (around 03/30/2022), or if symptoms worsen or fail to improve, for Diabetes and hypertension and cholesterol.  Counseling provided for all of the vaccine components Orders Placed This Encounter  Procedures   Varicella-zoster vaccine IM (Shingrix)    Caryl Pina, MD [facility] 12/28/2021, 3:47 PM

## 2022-01-02 ENCOUNTER — Telehealth: Payer: Self-pay | Admitting: Family Medicine

## 2022-01-02 NOTE — Telephone Encounter (Signed)
Left message informing pt that we do not have the '7mg'$  of Rybelsus . Advised pt to continuing checking for samples and if I seen any I would call him.

## 2022-01-05 ENCOUNTER — Telehealth: Payer: Self-pay | Admitting: *Deleted

## 2022-01-05 NOTE — Telephone Encounter (Signed)
Contacted patient and informed reybelsus samples have arrived.

## 2022-01-08 DIAGNOSIS — L570 Actinic keratosis: Secondary | ICD-10-CM | POA: Diagnosis not present

## 2022-01-08 DIAGNOSIS — Z85820 Personal history of malignant melanoma of skin: Secondary | ICD-10-CM | POA: Diagnosis not present

## 2022-01-08 DIAGNOSIS — D235 Other benign neoplasm of skin of trunk: Secondary | ICD-10-CM | POA: Diagnosis not present

## 2022-01-08 DIAGNOSIS — L814 Other melanin hyperpigmentation: Secondary | ICD-10-CM | POA: Diagnosis not present

## 2022-01-08 DIAGNOSIS — L821 Other seborrheic keratosis: Secondary | ICD-10-CM | POA: Diagnosis not present

## 2022-01-08 DIAGNOSIS — B354 Tinea corporis: Secondary | ICD-10-CM | POA: Diagnosis not present

## 2022-01-08 DIAGNOSIS — B351 Tinea unguium: Secondary | ICD-10-CM | POA: Diagnosis not present

## 2022-01-08 DIAGNOSIS — L579 Skin changes due to chronic exposure to nonionizing radiation, unspecified: Secondary | ICD-10-CM | POA: Diagnosis not present

## 2022-01-08 DIAGNOSIS — Z85828 Personal history of other malignant neoplasm of skin: Secondary | ICD-10-CM | POA: Diagnosis not present

## 2022-01-15 ENCOUNTER — Ambulatory Visit (INDEPENDENT_AMBULATORY_CARE_PROVIDER_SITE_OTHER): Payer: Medicare HMO

## 2022-01-15 VITALS — Ht 69.0 in | Wt 184.0 lb

## 2022-01-15 DIAGNOSIS — Z0000 Encounter for general adult medical examination without abnormal findings: Secondary | ICD-10-CM

## 2022-01-15 NOTE — Progress Notes (Signed)
Subjective:   Philip Huang is a 71 y.o. male who presents for Medicare Annual/Subsequent preventive examination. Virtual Visit via Telephone Note  I connected with  Philip Huang on 01/15/22 at  8:15 AM EDT by telephone and verified that I am speaking with the correct person using two identifiers.  Location: Patient: HOME Provider: [facility] Persons participating in the virtual visit: patient/Nurse Health Advisor   I discussed the limitations, risks, security and privacy concerns of performing an evaluation and management service by telephone and the availability of in person appointments. The patient expressed understanding and agreed to proceed.  Interactive audio and video telecommunications were attempted between this nurse and patient, however failed, due to patient having technical difficulties OR patient did not have access to video capability.  We continued and completed visit with audio only.  Some vital signs may be absent or patient reported.   Philip Driver, LPN  Review of Systems     Cardiac Risk Factors include: advanced age (>83mn, >>85women);diabetes mellitus;hypertension;dyslipidemia;male gender;sedentary lifestyle     Objective:    Today's Vitals   01/15/22 0839  Weight: 184 lb (83.5 kg)  Height: 5' 9"  (1.753 m)   Body mass index is 27.17 kg/m.     01/15/2022    8:22 AM 12/27/2020    8:22 AM 05/05/2019    1:37 PM 04/11/2018    1:06 PM  Advanced Directives  Does Patient Have a Medical Advance Directive? Yes Yes Yes Yes  Type of [place]of [place] will [place] will Healthcare Power of [place]. [place] Does patient want to make changes to medical advance directive?  No - Guardian declined No - Patient declined   Copy of [place] Chart? No - copy requested No - copy requested No - copy requested No - copy requested  Would patient like  information on creating a medical advance directive?  No - Patient declined      Current Medications (verified) Outpatient Encounter Medications as of 01/15/2022  Medication Sig   ACCU-CHEK AVIVA PLUS test strip TEST BLOOD SUGAR TWICE DAILY   Accu-Chek Softclix Lancets lancets TEST BLOOD SUGAR TWICE DAILY Dx E11.9   Alcohol Swabs (DROPSAFE ALCOHOL PREP) 70 % PADS USE AS DIRECTED TWICE DAILY   aspirin 81 MG tablet Take 81 mg by mouth daily.   Blood Glucose Monitoring Suppl (TRUE METRIX AIR GLUCOSE METER) w/Device KIT Test BS BID Dx E11.9   Blood Glucose Monitoring Suppl (TRUE METRIX METER) w/Device KIT USE AS DIRECTED   Cholecalciferol (VITAMIN D) 2000 UNITS tablet Take 2,000 Units by mouth daily.   empagliflozin (JARDIANCE) 25 MG TABS tablet Take 1 tablet (25 mg total) by mouth daily before breakfast.   fenofibrate 160 MG tablet Take 1 tablet (160 mg total) by mouth daily.   fish oil-omega-3 fatty acids 1000 MG capsule Take 1 g by mouth daily.   glipiZIDE (GLUCOTROL XL) 10 MG 24 hr tablet TAKE 1 TABLET EVERY DAY WITH BREAKFAST   lisinopril-hydrochlorothiazide (ZESTORETIC) 20-12.5 MG tablet Take 2 tablets by mouth daily.   metFORMIN (GLUCOPHAGE) 1000 MG tablet Take 1 tablet (1,000 mg total) by mouth 2 (two) times daily with a meal.   Multiple Vitamins-Minerals (MENS 50+ ADVANCED PO) Take by mouth.   nitroGLYCERIN (NITROSTAT) 0.4 MG SL tablet Place 1 tablet (0.4 mg total) under the tongue every 5 (five) minutes as needed.   rosuvastatin (CRESTOR) 40 MG tablet  TAKE 1 TABLET EVERY DAY   Semaglutide 14 MG TABS Take 14 mg by mouth daily.   SHINGRIX injection    No facility-administered encounter medications on file as of 01/15/2022.    Allergies (verified) Doxycycline and Sulfa antibiotics   History: Past Medical History:  Diagnosis Date   Diabetes mellitus    HTN (hypertension)    Hyperlipidemia    Overweight(278.02)    Squamous acanthoma of upper extremity    Past Surgical History:   Procedure Laterality Date   None     squamous acanthoma of upper extremity     Family History  Problem Relation Age of Onset   Diabetes Mother    Heart disease Mother 23       CABG   Hyperlipidemia Mother    Hypertension Mother    Parkinsonism Father    Diabetes Sister    Hyperlipidemia Sister    Hypertension Sister    Diabetes Brother    Hyperlipidemia Brother    Hypertension Brother    Social History   Socioeconomic History   Marital status: Married    Spouse name: Philip Huang   Number of children: 2   Years of education: 12   Highest education level: High school graduate  Occupational History   Occupation: Retired    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  Tobacco Use   Smoking status: Former    Types: Cigarettes    Quit date: 08/06/1962    Years since quitting: 59.4   Smokeless tobacco: Never  Vaping Use   Vaping Use: Never used  Substance and Sexual Activity   Alcohol use: No   Drug use: No   Sexual activity: Yes  Other Topics Concern   Not on file  Social History Narrative   Not on file   Social Determinants of Health   Financial Resource Strain: Low Risk  (01/15/2022)   Overall Financial Resource Strain (CARDIA)    Difficulty of Paying Living Expenses: Not hard at all  Food Insecurity: No Food Insecurity (01/15/2022)   Hunger Vital Sign    Worried About Running Out of Food in the Last Year: Never true    Ran Out of Food in the Last Year: Never true  Transportation Needs: No Transportation Needs (01/15/2022)   PRAPARE - Hydrologist (Medical): No    Lack of Transportation (Non-Medical): No  Physical Activity: Sufficiently Active (01/15/2022)   Exercise Vital Sign    Days of Exercise per Week: 5 days    Minutes of Exercise per Session: 30 min  Stress: No Stress Concern Present (01/15/2022)   [place]    Feeling of Stress : Not at all  Social  Connections: Moderately Integrated (01/15/2022)   Social Connection and Isolation Panel [NHANES]    Frequency of Communication with Friends and Family: More than three times a week    Frequency of Social Gatherings with Friends and Family: More than three times a week    Attends Religious Services: 1 to 4 times per year    Active Member of [employer] or Organizations: No    Attends Archivist Meetings: Never    Marital Status: Married    Tobacco Counseling Counseling given: Not Answered   Clinical Intake:  Pre-visit preparation completed: Yes  Pain : No/denies pain     Nutritional Risks: None Diabetes: Yes  How often do you need to have someone help  you when you read instructions, pamphlets, or other written materials from your doctor or pharmacy?: 1 - Never  Diabetic?Nutrition Risk Assessment:  Has the patient had any N/V/D within the last 2 months?  No  Does the patient have any non-healing wounds?  No  Has the patient had any unintentional weight loss or weight gain?  No   Diabetes:  Is the patient diabetic?  Yes  If diabetic, was a CBG obtained today?  Yes  Did the patient bring in their glucometer from home?   N How often do you monitor your CBG's? 2x per day.   Financial Strains and Diabetes Management:  Are you having any financial strains with the device, your supplies or your medication? No .  Does the patient want to be seen by Chronic Care Management for management of their diabetes?  No  Would the patient like to be referred to a Nutritionist or for Diabetic Management?  No   Diabetic Exams:  Diabetic Eye Exam: Completed 09/2021. Pt has been advised about the importance in completing this exam. Diabetic Foot Exam: Completed 12/28/2021. Pt has been advised about the importance in completing this exam.  Interpreter Needed?: No  Information entered by :: mj Man Bonneau, lpn   Activities of Daily Living    01/15/2022    8:23 AM  In your present state of  health, do you have any difficulty performing the following activities:  Hearing? 0  Vision? 0  Difficulty concentrating or making decisions? 0  Walking or climbing stairs? 0  Dressing or bathing? 0  Doing errands, shopping? 0  Preparing Food and eating ? N  Using the Toilet? N  In the past six months, have you accidently leaked urine? N  Do you have problems with loss of bowel control? N  Managing your Medications? N  Managing your Finances? N  Housekeeping or managing your Housekeeping? N    Patient Care Team: [name], Fransisca Kaufmann, MD as PCP - General (Family Medicine) Lavera Guise, [facility] (Pharmacist)  Indicate any recent Medical Services you may have received from other than [facility] providers in the past year (date may be approximate).     Assessment:   This is a routine wellness examination for Ladarian.  Hearing/Vision screen Hearing Screening - Comments:: No hearing issues.  Vision Screening - Comments:: No glasses. [facility]. 09/2021.  Dietary issues and exercise activities discussed: Current Exercise Habits: Home exercise routine, Type of exercise: walking, Time (Minutes): 30, Frequency (Times/Week): 5, Weekly Exercise (Minutes/Week): 150, Intensity: Mild, Exercise limited by: cardiac condition(s)   Goals Addressed             This Visit's Progress    DIET - INCREASE WATER INTAKE   On track    Try to drink 6-8 glasses of water daily.     Exercise 150 min/wk Moderate Activity   On track    01/15/22-Continue to stay active and healthy.       Depression Screen    01/15/2022    8:19 AM 09/21/2021    4:19 PM 08/17/2021    4:02 PM 06/15/2021    8:56 AM 03/09/2021    8:51 AM 12/01/2020    8:00 AM 05/20/2020   11:12 AM  PHQ 2/9 Scores  PHQ - 2 Score 0 0 0 0 0 0 0  PHQ- 9 Score  0         Fall Risk    01/15/2022    8:22 AM 09/21/2021  4:19 PM 08/17/2021    4:01 PM 06/15/2021    8:56 AM 03/09/2021    8:51 AM  Fall Risk   Falls in the past year? 0 0  0 0 0  Number falls in past yr: 0 0     Injury with Fall? 0 0     Risk for fall due to : No Fall Risks      Follow up Falls prevention discussed        [place]:  Any stairs in or around the home? Yes  If so, are there any without handrails? No  Home free of loose throw rugs in walkways, pet beds, electrical cords, etc? Yes  Adequate lighting in your home to reduce risk of falls? Yes   ASSISTIVE DEVICES UTILIZED TO PREVENT FALLS:  Life alert? No  Use of a cane, walker or w/c? No  Grab bars in the bathroom? No  Shower chair or bench in shower? No  Elevated toilet seat or a handicapped toilet? No   TIMED UP AND GO:  Was the test performed? No .    Cognitive Function:    04/11/2018    1:16 PM 04/11/2018    1:09 PM  MMSE - Mini Mental State Exam  Orientation to time 4 4  Orientation to Place 5 3  Registration 3 3  Attention/ Calculation 4 4  Recall 2 2  Language- name 2 objects 2 2  Language- repeat 1 1  Language- follow 3 step command 3 3  Language- read & follow direction 1 1  Write a sentence 1 1  Copy design 1 1  Total score 27 25        01/15/2022    8:23 AM 12/27/2020    8:25 AM 05/05/2019    1:41 PM  6CIT Screen  What Year? 0 points 0 points 0 points  What month? 0 points 0 points 0 points  What time? 0 points 0 points 0 points  Count back from 20 0 points 0 points 0 points  Months in reverse 4 points 0 points 4 points  Repeat phrase 0 points 0 points 0 points  Total Score 4 points 0 points 4 points    Immunizations Immunization History  Administered Date(s) Administered   Fluad Quad(high Dose 65+) 04/29/2019, 05/20/2020, 06/15/2021   Influenza, High Dose Seasonal PF 04/27/2018   Influenza,inj,Quad PF,6+ Mos 04/30/2015, 04/20/2017   Influenza-Unspecified 05/20/2016   PFIZER(Purple Top)SARS-COV-2 Vaccination 10/07/2019, 10/28/2019   Pneumococcal Conjugate-13 08/26/2017, 08/26/2017   Pneumococcal Polysaccharide-23  05/06/2001, 05/06/2001, 05/20/2020   Td 08/06/2005, 06/29/2013   Tdap 08/21/2019   Zoster Recombinat (Shingrix) 09/21/2021, 12/28/2021   Zoster, Live 10/28/2013    TDAP status: Up to date  Flu Vaccine status: Up to date  Pneumococcal vaccine status: Up to date  Covid-19 vaccine status: Completed vaccines  Qualifies for Shingles Vaccine? Yes   Zostavax completed Yes   Shingrix Completed?: Yes  Screening Tests Health Maintenance  Topic Date Due   COVID-19 Vaccine (3 - Pfizer risk series) 01/31/2022 (Originally 11/25/2019)   INFLUENZA VACCINE  03/06/2022   HEMOGLOBIN A1C  03/29/2022   OPHTHALMOLOGY EXAM  09/20/2022   FOOT EXAM  12/29/2022   COLONOSCOPY (Pts 45-19yr Insurance coverage will need to be confirmed)  05/17/2025   TETANUS/TDAP  08/20/2029   Pneumonia Vaccine 70 Years old  Completed   Hepatitis C Screening  Completed   Zoster Vaccines- Shingrix  Completed   HPV VACCINES  Aged  Out   COLON CANCER SCREENING ANNUAL FOBT  Discontinued    Health Maintenance  There are no preventive care reminders to display for this patient.   Colorectal cancer screening: Type of screening: Colonoscopy. Completed 05/18/2015. Repeat every 10 years  Lung Cancer Screening: (Low Dose CT Chest recommended if Age 54-80 years, 30 pack-year currently smoking OR have quit w/in 15years.) does not qualify.   Additional Screening:  Hepatitis C Screening: does qualify; Completed 07/19/2015  Vision Screening: Recommended annual ophthalmology exams for early detection of glaucoma and other disorders of the eye. Is the patient up to date with their annual eye exam?  Yes  Who is the provider or what is the name of the office in which the patient attends annual eye exams? [facility] If pt is not established with a provider, would they like to be referred to a provider to establish care? No .   Dental Screening: Recommended annual dental exams for proper oral hygiene  Community  Resource Referral / Chronic Care Management: CRR required this visit?  No   CCM required this visit?  No      Plan:     I have personally reviewed and noted the following in the patient's chart:   Medical and social history Use of alcohol, tobacco or illicit drugs  Current medications and supplements including opioid prescriptions. Patient is not currently taking opioid prescriptions. Functional ability and status Nutritional status Physical activity Advanced directives List of other physicians Hospitalizations, surgeries, and ER visits in previous 12 months Vitals Screenings to include cognitive, depression, and falls Referrals and appointments  In addition, I have reviewed and discussed with patient certain preventive protocols, quality metrics, and best practice recommendations. A written personalized care plan for preventive services as well as general preventive health recommendations were provided to patient.     Philip Driver, LPN   2/56/1548   Nurse Notes: Pt is up to date on vaccines and health maintenance.

## 2022-01-15 NOTE — Patient Instructions (Signed)
Philip Huang , Thank you for taking time to come for your Medicare Wellness Visit. I appreciate your ongoing commitment to your health goals. Please review the following plan we discussed and let me know if I can assist you in the future.   Screening recommendations/referrals: Colonoscopy: Done 05/18/2015 Repeat in 10 years  Recommended yearly ophthalmology/optometry visit for glaucoma screening and checkup Recommended yearly dental visit for hygiene and checkup  Vaccinations: Influenza vaccine: Done 06/15/21 Repeat annually  Pneumococcal vaccine: Done 07/06/2018, 05/20/2020. Tdap vaccine: Done 08/21/2019 Repeat in 10 years  Shingles vaccine: Done 12/28/2021, 09/21/2021 and 10/28/2013.   Covid-19: Done 10/28/2019 and 10/07/2019.  Advanced directives: Please bring a copy of your health care power of attorney and living will to the office to be added to your chart at your convenience.   Conditions/risks identified: Aim for 30 minutes of exercise or brisk walking, 6-8 glasses of water, and 5 servings of fruits and vegetables each day.   Next appointment: Follow up in one year for your annual wellness visit. 2024.  Preventive Care 6 Years and Older, Male  Preventive care refers to lifestyle choices and visits with your health care provider that can promote health and wellness. What does preventive care include? A yearly physical exam. This is also called an annual well check. Dental exams once or twice a year. Routine eye exams. Ask your health care provider how often you should have your eyes checked. Personal lifestyle choices, including: Daily care of your teeth and gums. Regular physical activity. Eating a healthy diet. Avoiding tobacco and drug use. Limiting alcohol use. Practicing safe sex. Taking low doses of aspirin every day. Taking vitamin and mineral supplements as recommended by your health care provider. What happens during an annual well check? The services and  screenings done by your health care provider during your annual well check will depend on your age, overall health, lifestyle risk factors, and family history of disease. Counseling  Your health care provider may ask you questions about your: Alcohol use. Tobacco use. Drug use. Emotional well-being. Home and relationship well-being. Sexual activity. Eating habits. History of falls. Memory and ability to understand (cognition). Work and work Statistician. Screening  You may have the following tests or measurements: Height, weight, and BMI. Blood pressure. Lipid and cholesterol levels. These may be checked every 5 years, or more frequently if you are over 61 years old. Skin check. Lung cancer screening. You may have this screening every year starting at age 52 if you have a 30-pack-year history of smoking and currently smoke or have quit within the past 15 years. Fecal occult blood test (FOBT) of the stool. You may have this test every year starting at age 78. Flexible sigmoidoscopy or colonoscopy. You may have a sigmoidoscopy every 5 years or a colonoscopy every 10 years starting at age 57. Prostate cancer screening. Recommendations will vary depending on your family history and other risks. Hepatitis C blood test. Hepatitis B blood test. Sexually transmitted disease (STD) testing. Diabetes screening. This is done by checking your blood sugar (glucose) after you have not eaten for a while (fasting). You may have this done every 1-3 years. Abdominal aortic aneurysm (AAA) screening. You may need this if you are a current or former smoker. Osteoporosis. You may be screened starting at age 70 if you are at high risk. Talk with your health care provider about your test results, treatment options, and if necessary, the need for more tests. Vaccines  Your health care provider  may recommend certain vaccines, such as: Influenza vaccine. This is recommended every year. Tetanus, diphtheria, and  acellular pertussis (Tdap, Td) vaccine. You may need a Td booster every 10 years. Zoster vaccine. You may need this after age 97. Pneumococcal 13-valent conjugate (PCV13) vaccine. One dose is recommended after age 58. Pneumococcal polysaccharide (PPSV23) vaccine. One dose is recommended after age 19. Talk to your health care provider about which screenings and vaccines you need and how often you need them. This information is not intended to replace advice given to you by your health care provider. Make sure you discuss any questions you have with your health care provider. Document Released: 08/19/2015 Document Revised: 04/11/2016 Document Reviewed: 05/24/2015 Elsevier Interactive Patient Education  2017 [place] Prevention in the Home Falls can cause injuries. They can happen to people of all ages. There are many things you can do to make your home safe and to help prevent falls. What can I do on the outside of my home? Regularly fix the edges of walkways and driveways and fix any cracks. Remove anything that might make you trip as you walk through a door, such as a raised step or threshold. Trim any bushes or trees on the path to your home. Use bright outdoor lighting. Clear any walking paths of anything that might make someone trip, such as rocks or tools. Regularly check to see if handrails are loose or broken. Make sure that both sides of any steps have handrails. Any raised decks and porches should have guardrails on the edges. Have any leaves, snow, or ice cleared regularly. Use sand or salt on walking paths during winter. Clean up any spills in your garage right away. This includes oil or grease spills. What can I do in the bathroom? Use night lights. Install grab bars by the toilet and in the tub and shower. Do not use towel bars as grab bars. Use non-skid mats or decals in the tub or shower. If you need to sit down in the shower, use a plastic, non-slip stool. Keep  the floor dry. Clean up any water that spills on the floor as soon as it happens. Remove soap buildup in the tub or shower regularly. Attach bath mats securely with double-sided non-slip rug tape. Do not have throw rugs and other things on the floor that can make you trip. What can I do in the bedroom? Use night lights. Make sure that you have a light by your bed that is easy to reach. Do not use any sheets or blankets that are too big for your bed. They should not hang down onto the floor. Have a firm chair that has side arms. You can use this for support while you get dressed. Do not have throw rugs and other things on the floor that can make you trip. What can I do in the kitchen? Clean up any spills right away. Avoid walking on wet floors. Keep items that you use a lot in easy-to-reach places. If you need to reach something above you, use a strong step stool that has a grab bar. Keep electrical cords out of the way. Do not use floor polish or wax that makes floors slippery. If you must use wax, use non-skid floor wax. Do not have throw rugs and other things on the floor that can make you trip. What can I do with my stairs? Do not leave any items on the stairs. Make sure that there are handrails on both sides  of the stairs and use them. Fix handrails that are broken or loose. Make sure that handrails are as long as the stairways. Check any carpeting to make sure that it is firmly attached to the stairs. Fix any carpet that is loose or worn. Avoid having throw rugs at the top or bottom of the stairs. If you do have throw rugs, attach them to the floor with carpet tape. Make sure that you have a light switch at the top of the stairs and the bottom of the stairs. If you do not have them, ask someone to add them for you. What else can I do to help prevent falls? Wear shoes that: Do not have high heels. Have rubber bottoms. Are comfortable and fit you well. Are closed at the toe. Do not  wear sandals. If you use a stepladder: Make sure that it is fully opened. Do not climb a closed stepladder. Make sure that both sides of the stepladder are locked into place. Ask someone to hold it for you, if possible. Clearly mark and make sure that you can see: Any grab bars or handrails. First and last steps. Where the edge of each step is. Use tools that help you move around (mobility aids) if they are needed. These include: Canes. Walkers. Scooters. Crutches. Turn on the lights when you go into a dark area. Replace any light bulbs as soon as they burn out. Set up your furniture so you have a clear path. Avoid moving your furniture around. If any of your floors are uneven, fix them. If there are any pets around you, be aware of where they are. Review your medicines with your doctor. Some medicines can make you feel dizzy. This can increase your chance of falling. Ask your doctor what other things that you can do to help prevent falls. This information is not intended to replace advice given to you by your health care provider. Make sure you discuss any questions you have with your health care provider. Document Released: 05/19/2009 Document Revised: 12/29/2015 Document Reviewed: 08/27/2014 Elsevier Interactive Patient Education  2017 [employer].

## 2022-01-26 ENCOUNTER — Ambulatory Visit (INDEPENDENT_AMBULATORY_CARE_PROVIDER_SITE_OTHER): Payer: Medicare HMO | Admitting: Pharmacist

## 2022-01-26 DIAGNOSIS — E1169 Type 2 diabetes mellitus with other specified complication: Secondary | ICD-10-CM

## 2022-01-26 DIAGNOSIS — E785 Hyperlipidemia, unspecified: Secondary | ICD-10-CM

## 2022-01-31 ENCOUNTER — Ambulatory Visit (INDEPENDENT_AMBULATORY_CARE_PROVIDER_SITE_OTHER): Payer: Medicare HMO | Admitting: Nurse Practitioner

## 2022-01-31 ENCOUNTER — Encounter: Payer: Self-pay | Admitting: Nurse Practitioner

## 2022-01-31 VITALS — BP 130/67 | HR 66 | Temp 98.6°F | Ht 69.0 in | Wt 180.8 lb

## 2022-01-31 DIAGNOSIS — M5489 Other dorsalgia: Secondary | ICD-10-CM | POA: Diagnosis not present

## 2022-01-31 MED ORDER — PREDNISONE 10 MG PO TABS: 10.0000 mg | ORAL_TABLET | Freq: Every day | ORAL | 0 refills | Status: DC

## 2022-01-31 MED ORDER — METHOCARBAMOL 500 MG PO TABS: 500.0000 mg | ORAL_TABLET | Freq: Four times a day (QID) | ORAL | 0 refills | Status: DC

## 2022-01-31 MED ORDER — IBUPROFEN 600 MG PO TABS: 600.0000 mg | ORAL_TABLET | Freq: Three times a day (TID) | ORAL | 0 refills | Status: DC | PRN

## 2022-01-31 NOTE — Patient Instructions (Addendum)
Acute Back Pain, Adult Acute back pain is sudden and usually short-lived. It is often caused by an injury to the muscles and tissues in the back. The injury may result from: A muscle, tendon, or ligament getting overstretched or torn. Ligaments are tissues that connect bones to each other. Lifting something improperly can cause a back strain. Wear and tear (degeneration) of the spinal disks. Spinal disks are circular tissue that provide cushioning between the bones of the spine (vertebrae). Twisting motions, such as while playing sports or doing yard work. A hit to the back. Arthritis. You may have a physical exam, lab tests, and imaging tests to find the cause of your pain. Acute back pain usually goes away with rest and home care. Follow these instructions at home: Managing pain, stiffness, and swelling Take over-the-counter and prescription medicines only as told by your health care provider. Treatment may include medicines for pain and inflammation that are taken by mouth or applied to the skin, or muscle relaxants. Your health care provider may recommend applying ice during the first 24-48 hours after your pain starts. To do this: Put ice in a plastic bag. Place a towel between your skin and the bag. Leave the ice on for 20 minutes, 2-3 times a day. Remove the ice if your skin turns bright red. This is very important. If you cannot feel pain, heat, or cold, you have a greater risk of damage to the area. If directed, apply heat to the affected area as often as told by your health care provider. Use the heat source that your health care provider recommends, such as a moist heat pack or a heating pad. Place a towel between your skin and the heat source. Leave the heat on for 20-30 minutes. Remove the heat if your skin turns bright red. This is especially important if you are unable to feel pain, heat, or cold. You have a greater risk of getting burned. Activity  Do not stay in bed. Staying in  bed for more than 1-2 days can delay your recovery. Sit up and stand up straight. Avoid leaning forward when you sit or hunching over when you stand. If you work at a desk, sit close to it so you do not need to lean over. Keep your chin tucked in. Keep your neck drawn back, and keep your elbows bent at a 90-degree angle (right angle). Sit high and close to the steering wheel when you drive. Add lower back (lumbar) support to your car seat, if needed. Take short walks on even surfaces as soon as you are able. Try to increase the length of time you walk each day. Do not sit, drive, or stand in one place for more than 30 minutes at a time. Sitting or standing for long periods of time can put stress on your back. Do not drive or use heavy machinery while taking prescription pain medicine. Use proper lifting techniques. When you bend and lift, use positions that put less stress on your back: Bend your knees. Keep the load close to your body. Avoid twisting. Exercise regularly as told by your health care provider. Exercising helps your back heal faster and helps prevent back injuries by keeping muscles strong and flexible. Work with a physical therapist to make a safe exercise program, as recommended by your health care provider. Do any exercises as told by your physical therapist. Lifestyle Maintain a healthy weight. Extra weight puts stress on your back and makes it difficult to have good   posture. Avoid activities or situations that make you feel anxious or stressed. Stress and anxiety increase muscle tension and can make back pain worse. Learn ways to manage anxiety and stress, such as through exercise. General instructions Sleep on a firm mattress in a comfortable position. Try lying on your side with your knees slightly bent. If you lie on your back, put a pillow under your knees. Keep your head and neck in a straight line with your spine (neutral position) when using electronic equipment like  smartphones or pads. To do this: Raise your smartphone or pad to look at it instead of bending your head or neck to look down. Put the smartphone or pad at the level of your face while looking at the screen. Follow your treatment plan as told by your health care provider. This may include: Cognitive or behavioral therapy. Acupuncture or massage therapy. Meditation or yoga. Contact a health care provider if: You have pain that is not relieved with rest or medicine. You have increasing pain going down into your legs or buttocks. Your pain does not improve after 2 weeks. You have pain at night. You lose weight without trying. You have a fever or chills. You develop nausea or vomiting. You develop abdominal pain. Get help right away if: You develop new bowel or bladder control problems. You have unusual weakness or numbness in your arms or legs. You feel faint. These symptoms may represent a serious problem that is an emergency. Do not wait to see if the symptoms will go away. Get medical help right away. Call your local emergency services (911 in the [place] Do not drive yourself to the hospital. Summary Acute back pain is sudden and usually short-lived. Use proper lifting techniques. When you bend and lift, use positions that put less stress on your back. Take over-the-counter and prescription medicines only as told by your health care provider, and apply heat or ice as told. This information is not intended to replace advice given to you by your health care provider. Make sure you discuss any questions you have with your health care provider. Document Revised: 10/14/2020 Document Reviewed: 10/14/2020 Elsevier Patient Education  2023 [employer]  

## 2022-01-31 NOTE — Progress Notes (Signed)
Acute Office Visit  Subjective:     Patient ID: Philip Huang, male    DOB: Dec 21, 1950, 71 y.o.   MRN: [id number]  Chief Complaint  Patient presents with   Back Pain    Been going on for about 2 weeks , lower left hip    Back Pain This is a new problem. The current episode started in the past 7 days. The problem occurs constantly. The problem has been gradually worsening since onset. The pain is present in the lumbar spine. The quality of the pain is described as aching. The pain is at a severity of 7/10. The pain is moderate. The pain is Worse during the day. The symptoms are aggravated by standing. Stiffness is present All day. Pertinent negatives include no bladder incontinence, bowel incontinence, dysuria, numbness or paresis. Risk factors: new job satanding long hours. He has tried muscle relaxant and heat for the symptoms. The treatment provided moderate relief.     Review of Systems  Constitutional: Negative.   HENT: Negative.    Respiratory: Negative.    Cardiovascular: Negative.   Gastrointestinal:  Negative for bowel incontinence.  Genitourinary:  Negative for bladder incontinence, dysuria, flank pain and urgency.  Musculoskeletal:  Positive for back pain and myalgias.  Skin: Negative.  Negative for rash.  Neurological:  Negative for numbness.  All other systems reviewed and are negative.       Objective:    BP 130/67   Pulse 66   Temp 98.6 F (37 C)   Ht '5\' 9"'$  (1.753 m)   Wt 180 lb 12.8 oz (82 kg)   SpO2 96%   BMI 26.70 kg/m  BP Readings from Last 3 Encounters:  01/31/22 130/67  12/28/21 134/68  09/21/21 (!) 151/66   Wt Readings from Last 3 Encounters:  01/31/22 180 lb 12.8 oz (82 kg)  01/15/22 184 lb (83.5 kg)  12/28/21 184 lb (83.5 kg)      Physical Exam Vitals and nursing note reviewed.  Constitutional:      Appearance: Normal appearance.  HENT:     Head: Normocephalic.     Right Ear: External ear normal.     Left Ear: External ear  normal.     Nose: Nose normal.  Eyes:     Conjunctiva/sclera: Conjunctivae normal.  Cardiovascular:     Rate and Rhythm: Normal rate and regular rhythm.     Pulses: Normal pulses.     Heart sounds: Normal heart sounds.  Pulmonary:     Effort: Pulmonary effort is normal.     Breath sounds: Normal breath sounds.  Abdominal:     Tenderness: There is no abdominal tenderness. There is no right CVA tenderness or left CVA tenderness.  Musculoskeletal:     Lumbar back: Tenderness present. Decreased range of motion.  Skin:    General: Skin is warm.     Findings: No rash.  Neurological:     Mental Status: He is alert.     No results found for any visits on 01/31/22.      Assessment & Plan:  Patient presents with acute bilateral lower back pain without sciatica.  Symptoms present in the past 1 week.  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  Provided education on wearing appropriate shoes for work with insoles or memory foam to provide comfort.  Started patient on Robaxin 500 mg tablet by mouth 4 times daily as needed for musculoskeletal pain.  Continue heating pad, anti-inflammatory ibuprofen as needed.  Prednisone 10 mg tablet by mouth daily for 6 days with breakfast.  Educated patient that prednisone may increase blood glucose so monitor intake medication as prescribed  Follow-up with worsening unresolved symptoms.  Patient verbalized understanding. Problem List Items Addressed This Visit   None Visit Diagnoses     Back pain without sciatica    -  Primary   Relevant Medications   predniSONE (DELTASONE) 10 MG tablet   ibuprofen (ADVIL) 600 MG tablet   methocarbamol (ROBAXIN) 500 MG tablet       Meds ordered this encounter  Medications   predniSONE (DELTASONE) 10 MG tablet    Sig: Take 1 tablet (10 mg total) by mouth daily with breakfast.    Dispense:  6 tablet    Refill:  0    Order Specific Question:   Supervising Provider    Answer:    Jeneen Rinks   ibuprofen (ADVIL) 600 MG tablet    Sig: Take 1 tablet (600 mg total) by mouth every 8 (eight) hours as needed.    Dispense:  30 tablet    Refill:  0    Order Specific Question:   Supervising Provider    Answer:   Claretta Fraise [[id number]]   methocarbamol (ROBAXIN) 500 MG tablet    Sig: Take 1 tablet (500 mg total) by mouth 4 (four) times daily.    Dispense:  30 tablet    Refill:  0    Order Specific Question:   Supervising Provider    Answer:   Claretta Fraise [phone]    Return if symptoms worsen or fail to improve.  Ivy Lynn, NP

## 2022-02-02 DIAGNOSIS — E785 Hyperlipidemia, unspecified: Secondary | ICD-10-CM

## 2022-02-02 DIAGNOSIS — E1169 Type 2 diabetes mellitus with other specified complication: Secondary | ICD-10-CM

## 2022-02-07 ENCOUNTER — Ambulatory Visit: Payer: Medicare HMO

## 2022-02-07 NOTE — Patient Instructions (Addendum)
Visit Information  Following are the goals we discussed today:  Current Barriers:  Unable to independently afford treatment regimen Unable to achieve control of T2DM   Pharmacist Clinical Goal(s):  Over the next 180 days, patient will verbalize ability to afford treatment regimen achieve control of T2DM as evidenced by IMPROVED GLYCEMIC CONTROL/GOAL A1C through collaboration with PharmD and provider.   Interventions: 1:1 collaboration with [name], Fransisca Kaufmann, MD regarding development and update of comprehensive plan of care as evidenced by provider attestation and co-signature Inter-disciplinary care team collaboration (see longitudinal plan of care) Comprehensive medication review performed; medication list updated in electronic medical record  Diabetes: Uncontrolled-A1C 7.0%, GFR 65;  Current treatment: Metformin 1g BID Rybelsus '14mg'$  daily Via [employer] patient assistance program (last shipment arrived 05/25/21 to PCP office) Vania Rea '25mg'$  daily Via [employer] patient assistance program Glipizide '10mg'$  XL daily  Of note, patient does not wish to go on injectable--we have discussed adding/switching to if A1c still not controlled at f/u PCP appt Current glucose readings: fasting glucose: <160, post prandial glucose: <200 Denies hypoglycemic/hyperglycemic symptoms Current meal patterns: doesn't have a large appetite on Rybelsus '14mg'$  daily Dietary materials provided for low carb sweet options plus meal planning Discussed meal planning options and Plate method for healthy eating Avoid sugary drinks and desserts Incorporate balanced protein, non starchy veggies, 1 serving of carbohydrate with each meal Increase water intake Increase physical activity as able Current exercise: works/active outdoors Educated on diabetes (making additional lifestyle changes) and taking medications as prescribed Collaborated with PCP--will not change therapy at this time; recheck A1c;  patient does not wish to have an injectable medications; continue all medications as prescribed Assessed patient finances. Patient enrolled in [employer] Patient Assistance for [place] Patient Assistance for [employer]   Patient Goals/Self-Care Activities Over the next 180 days, patient will:  - take medications as prescribed check glucose 3X WEEKLY OR IF SYMPTOMATIC, document, and provide at future appointments collaborate with provider on medication access solutions  Follow Up Plan: Telephone follow up appointment with care management team member scheduled for: 1 MONTH   Plan: Telephone follow up appointment with care management team member scheduled for:  3 months  Signature Regina Eck, PharmD, BCPS Clinical Pharmacist, [place]  [facility] Phone [phone]   Please call the care guide team at [phone] if you need to cancel or reschedule your appointment.   The patient verbalized understanding of instructions, educational materials, and care plan provided today and DECLINED offer to receive copy of patient instructions, educational materials, and care plan.

## 2022-02-07 NOTE — Progress Notes (Signed)
Chronic Care Management Pharmacy Note  70/23/2023 Name:  Philip Huang MRN:  [id number] DOB:  Jan 26, 1951  Summary: Diabetes: Controlled-A1C 7.0%, GFR 65;  Current treatment: Metformin 1g BID Rybelsus 32m daily Via novo [employer] patient assistance program (last shipment arrived 05/25/21 to PCP office) Jardiance 258mdaily Via [employer] patient assistance program Glipizide 1032mL daily  Of note, patient does not wish to go on injectable--we have discussed adding/switching to if A1c still not controlled at f/u PCP appt Current glucose readings: fasting glucose: <160, post prandial glucose: <200 Denies hypoglycemic/hyperglycemic symptoms Current meal patterns: doesn't have a large appetite on Rybelsus 69m33mily Dietary materials provided for low carb sweet options plus meal planning Discussed meal planning options and Plate method for healthy eating Avoid sugary drinks and desserts Incorporate balanced protein, non starchy veggies, 1 serving of carbohydrate with each meal Increase water intake Increase physical activity as able Current exercise: works/active outdoors Educated on diabetes (making additional lifestyle changes) and taking medications as prescribed Collaborated with PCP--will not change therapy at this time; recheck A1c; patient does not wish to have an injectable medications; continue all medications as prescribed Assessed patient finances. Patient enrolled in [employer] Assistance for [employer]be[employer] Assistance for Jard[employer] Warneratient Goals/Self-Care Activities Over the next 180 days, patient will:  - take medications as prescribed check glucose 3X WEEKLY OR IF SYMPTOMATIC, document, and provide at future appointments collaborate with provider on medication access solutions  Follow Up Plan: Telephone follow up appointment with care management team member scheduled for: 1 MONTH   Subjective: [name] 71 y74. year old male who is a primary patient of [name], [name].  The CCM team was consulted for assistance with disease management and care coordination needs.    Engaged with patient by telephone for follow up visit in response to provider referral for pharmacy case management and/or care coordination services.   Consent to Services:  The patient was given information about Chronic Care Management services, agreed to services, and gave verbal consent prior to initiation of services.  Please see initial visit note for detailed documentation.   Patient Care Team: [name], [name] as PCP - General (Family Medicine) PruiLavera [facility]iseH [facility]) Objective:  Lab Results  Component Value Date   CREATININE 1.10 12/27/2021   CREATININE 1.14 09/20/2021   CREATININE 1.29 (H) 06/13/2021    Lab Results  Component Value Date   HGBA1C 7.0 (H) 12/27/2021   Last diabetic Eye exam:  Lab Results  Component Value Date/Time   HMDIABEYEEXA Retinopathy (A) 08/16/2020 12:00 AM    Last diabetic Foot exam: No results found for: "HMDIABFOOTEX"      Component Value Date/Time   CHOL 151 12/27/2021 0804   CHOL 168 02/02/2013 0843   TRIG 100 12/27/2021 0804   TRIG 209 (H) 10/22/2014 0810   TRIG 164 (H) 02/02/2013 0843   HDL 43 12/27/2021 0804   HDL 36 (L) 10/22/2014 0810   HDL 43 02/02/2013 0843   CHOLHDL 3.5 12/27/2021 0804   LDLCALC 89 12/27/2021 0804   LDLCALC 100 (H) 03/12/2014 1600   LDLCALC 92 02/02/2013 0843       Latest Ref Rng & Units 12/27/2021    8:04 AM 09/20/2021    8:09 AM 06/13/2021    8:09 AM  Hepatic Function  Total Protein 6.0 - 8.5 g/dL 6.9  6.9  7.0   Albumin 3.8 -  4.8 g/dL 4.6  4.6  4.7   AST 0 - 40 IU/L 22  22  29    ALT 0 - 44 IU/L 21  27  29    Alk Phosphatase 44 - 121 IU/L 47  55  43   Total Bilirubin 0.0 - 1.2 mg/dL 0.3  0.2  0.5     Lab Results  Component Value Date/Time   TSH 2.501 02/02/2013 08:43 AM       Latest Ref  Rng & Units 12/27/2021    8:04 AM 09/20/2021    8:09 AM 06/13/2021    8:09 AM  CBC  WBC 3.4 - 10.8 x10E3/uL 6.8  6.6  7.1   Hemoglobin 13.0 - 17.7 g/dL 14.7  14.6  14.9   Hematocrit 37.5 - 51.0 % 43.4  42.5  43.6   Platelets 150 - 450 x10E3/uL 263  249  229     Lab Results  Component Value Date/Time   VD25OH 42 02/02/2013 08:43 AM    Clinical ASCVD: No  The 10-year ASCVD risk score (Arnett DK, et al., 2019) is: 32.7%   Values used to calculate the score:     Age: 71 years     Sex: Male     Is Non-Hispanic African American: No     Diabetic: Yes     Tobacco smoker: No     Systolic Blood Pressure: 119 mmHg     Is BP treated: Yes     HDL Cholesterol: 43 mg/dL     Total Cholesterol: 151 mg/dL    Other: (CHADS2VASc if Afib, PHQ9 if depression, MMRC or CAT for COPD, ACT, DEXA)  Social History   Tobacco Use  Smoking Status Former   Types: Cigarettes   Quit date: 08/06/1962   Years since quitting: 59.5  Smokeless Tobacco Never   BP Readings from Last 3 Encounters:  01/31/22 130/67  12/28/21 134/68  09/21/21 (!) 151/66   Pulse Readings from Last 3 Encounters:  01/31/22 66  12/28/21 66  09/21/21 80   Wt Readings from Last 3 Encounters:  01/31/22 180 lb 12.8 oz (82 kg)  01/15/22 184 lb (83.5 kg)  12/28/21 184 lb (83.5 kg)    Assessment: Review of patient past medical history, allergies, medications, health status, including review of consultants reports, laboratory and other test data, was performed as part of comprehensive evaluation and provision of chronic care management services.   SDOH:  (Social Determinants of Health) assessments and interventions performed:    CCM Care Plan  Allergies  Allergen Reactions   Doxycycline    Sulfa Antibiotics Rash    Medications Reviewed Today     Reviewed by Lavera Guise, [facility] (Pharmacist) on 70/05/23 at 1125  Med List Status: <None>   Medication Order Taking? Sig Documenting Provider Last Dose Status Informant   ACCU-CHEK AVIVA PLUS test strip [id number] No TEST BLOOD SUGAR TWICE DAILY [name], Fransisca Kaufmann, MD Taking Active   Accu-Chek Softclix Lancets lancets [id number] No TEST BLOOD SUGAR TWICE DAILY Dx E11.9 [name], Fransisca Kaufmann, MD Taking Active   Alcohol Swabs (DROPSAFE ALCOHOL PREP) 70 % PADS [id number] No USE AS DIRECTED TWICE DAILY [name], Fransisca Kaufmann, MD Taking Active   aspirin 81 MG tablet [id number] No Take 81 mg by mouth daily. [provider] Taking Active   Blood Glucose Monitoring Suppl (TRUE METRIX AIR GLUCOSE METER) w/Device KIT [id number] No Test BS BID Dx E11.9 [name], Fransisca Kaufmann, MD Taking Active   Blood Glucose Monitoring Suppl (TRUE METRIX  METER) w/Device KIT [id number] No USE AS DIRECTED [name], Fransisca Kaufmann, MD Taking Active   Cholecalciferol (VITAMIN D) 2000 UNITS tablet [id number] No Take 2,000 Units by mouth daily. [provider] Taking Active            Med Note Blanca Friend, Darsh Vandevoort D   Fri Nov 20, 2019  8:34 AM) PAP   empagliflozin (JARDIANCE) 25 MG TABS tablet [id number] No Take 1 tablet (25 mg total) by mouth daily before breakfast. [name], Fransisca Kaufmann, MD Taking Active   fenofibrate 160 MG tablet [id number] No Take 1 tablet (160 mg total) by mouth daily. [name], Fransisca Kaufmann, MD Taking Active   fish oil-omega-3 fatty acids 1000 MG capsule [id number] No Take 1 g by mouth daily. [provider] Taking Active   glipiZIDE (GLUCOTROL XL) 10 MG 24 hr tablet [id number] No TAKE 1 TABLET EVERY DAY WITH BREAKFAST [name], Fransisca Kaufmann, MD Taking Active   ibuprofen (ADVIL) 600 MG tablet [id number]  Take 1 tablet (600 mg total) by mouth every 8 (eight) hours as needed. Ivy Lynn, NP  Active   lisinopril-hydrochlorothiazide (ZESTORETIC) 20-12.5 MG tablet [id number] No Take 2 tablets by mouth daily. [name], Fransisca Kaufmann, MD Taking Active   metFORMIN (GLUCOPHAGE) 1000 MG tablet [id number] No Take 1 tablet (1,000 mg total) by mouth 2 (two) times daily with a meal. [name], Fransisca Kaufmann,  MD Taking Active   methocarbamol (ROBAXIN) 500 MG tablet [id number]  Take 1 tablet (500 mg total) by mouth 4 (four) times daily. Ivy Lynn, NP  Active   Multiple Vitamins-Minerals (MENS 50+ ADVANCED PO) [id number] No Take by mouth. [provider] Taking Active   nitroGLYCERIN (NITROSTAT) 0.4 MG SL tablet [id number] No Place 1 tablet (0.4 mg total) under the tongue every 5 (five) minutes as needed. [name], Fransisca Kaufmann, MD Taking Active   predniSONE (DELTASONE) 10 MG tablet [id number]  Take 1 tablet (10 mg total) by mouth daily with breakfast. Ivy Lynn, NP  Active   rosuvastatin (CRESTOR) 40 MG tablet [id number] No TAKE 1 TABLET EVERY DAY [name], Fransisca Kaufmann, MD Taking Active   Semaglutide 14 MG TABS [id number] No Take 14 mg by mouth daily. 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 03/02/2016   BMI 28.0-28.9,adult    Hyperlipidemia associated with type 2 diabetes mellitus ([place]) 12/19/2010   Hypertension associated with diabetes ([place]) 12/19/2010   Type II diabetes mellitus ([place]) 12/19/2010    Immunization History  Administered Date(s) Administered   Fluad Quad(high Dose 65+) 04/29/2019, 05/20/2020, 06/15/2021   Influenza, High Dose Seasonal PF 04/27/2018   Influenza,inj,Quad PF,6+ Mos 04/30/2015, 04/20/2017   Influenza-Unspecified 05/20/2016   PFIZER(Purple Top)SARS-COV-2 Vaccination 10/07/2019, 10/28/2019   Pneumococcal Conjugate-13 08/26/2017, 08/26/2017   Pneumococcal Polysaccharide-23 05/06/2001, 05/06/2001, 05/20/2020   Td 08/06/2005, 06/29/2013   Tdap 08/21/2019   Zoster Recombinat (Shingrix) 09/21/2021, 12/28/2021   Zoster, Live  10/28/2013    Conditions to be addressed/monitored: HTN and DMII  Care Plan : PHARMD MEDICATION MANAGEMENT  Updates made by Lavera Guise, [place] since 02/07/2022 12:00  AM     Problem: DISEASE PROGRESSION PREVENTION      Long-Range Goal: T2DM   Recent Progress: Not on track  Priority: High  Note:   Current Barriers:  Unable to independently afford treatment regimen Unable to achieve control of T2DM   Pharmacist Clinical Goal(s):  Over the next 180 days, patient will verbalize ability to afford treatment regimen achieve control of T2DM as evidenced by IMPROVED GLYCEMIC CONTROL/GOAL A1C  through collaboration with PharmD and provider.   Interventions: 1:1 collaboration with [name], Fransisca Kaufmann, MD regarding development and update of comprehensive plan of care as evidenced by provider attestation and co-signature Inter-disciplinary care team collaboration (see longitudinal plan of care) Comprehensive medication review performed; medication list updated in electronic medical record  Diabetes: Uncontrolled-A1C 7.0%, GFR 65;  Current treatment: Metformin 1g BID Rybelsus 27m daily Via novo nordisk patient assistance program (last shipment arrived 05/25/21 to PCP office) Jardiance 278mdaily Via [employer] patient assistance program Glipizide 1063mL daily  Of note, patient does not wish to go on injectable--we have discussed adding/switching to if A1c still not controlled at f/u PCP appt Current glucose readings: fasting glucose: <160, post prandial glucose: <200 Denies hypoglycemic/hyperglycemic symptoms Current meal patterns: doesn't have a large appetite on Rybelsus 78m64mily Dietary materials provided for low carb sweet options plus meal planning Discussed meal planning options and Plate method for healthy eating Avoid sugary drinks and desserts Incorporate balanced protein, non starchy veggies, 1 serving of carbohydrate with each meal Increase water  intake Increase physical activity as able Current exercise: works/active outdoors Educated on diabetes (making additional lifestyle changes) and taking medications as prescribed Collaborated with PCP--will not change therapy at this time; recheck A1c; patient does not wish to have an injectable medications; continue all medications as prescribed Assessed patient finances. Patient enrolled in [employer] Assistance for [employer] Assistance for [employer]ard[employer] Warner[employer]ient Goals/Self-Care Activities Over the next 180 days, patient will:  - take medications as prescribed check glucose 3X WEEKLY OR IF SYMPTOMATIC, document, and provide at future appointments collaborate with provider on medication access solutions  Follow Up Plan: Telephone follow up appointment with care management team member scheduled for: 1 MONTH     Follow Up:  Patient agrees to Care Plan and Follow-up.  Plan: Telephone follow up appointment with care management team member scheduled for:  3 months   JuliRegina EckarmD, BCPS Clinical Pharmacist, [place] Phone [phone]

## 2022-02-17 DIAGNOSIS — M545 Low back pain, unspecified: Secondary | ICD-10-CM | POA: Diagnosis not present

## 2022-02-17 DIAGNOSIS — G8929 Other chronic pain: Secondary | ICD-10-CM | POA: Diagnosis not present

## 2022-02-19 DIAGNOSIS — M545 Low back pain, unspecified: Secondary | ICD-10-CM | POA: Diagnosis not present

## 2022-02-19 DIAGNOSIS — M47816 Spondylosis without myelopathy or radiculopathy, lumbar region: Secondary | ICD-10-CM | POA: Diagnosis not present

## 2022-02-21 DIAGNOSIS — M549 Dorsalgia, unspecified: Secondary | ICD-10-CM | POA: Diagnosis not present

## 2022-02-21 DIAGNOSIS — S61411A Laceration without foreign body of right hand, initial encounter: Secondary | ICD-10-CM | POA: Diagnosis not present

## 2022-02-21 DIAGNOSIS — E119 Type 2 diabetes mellitus without complications: Secondary | ICD-10-CM | POA: Diagnosis not present

## 2022-02-21 DIAGNOSIS — Y9389 Activity, other specified: Secondary | ICD-10-CM | POA: Diagnosis not present

## 2022-02-21 DIAGNOSIS — W268XXA Contact with other sharp object(s), not elsewhere classified, initial encounter: Secondary | ICD-10-CM | POA: Diagnosis not present

## 2022-02-21 DIAGNOSIS — Y999 Unspecified external cause status: Secondary | ICD-10-CM | POA: Diagnosis not present

## 2022-02-21 DIAGNOSIS — Y929 Unspecified place or not applicable: Secondary | ICD-10-CM | POA: Diagnosis not present

## 2022-02-22 ENCOUNTER — Ambulatory Visit (INDEPENDENT_AMBULATORY_CARE_PROVIDER_SITE_OTHER): Payer: Medicare HMO | Admitting: Family Medicine

## 2022-02-22 ENCOUNTER — Encounter: Payer: Self-pay | Admitting: Family Medicine

## 2022-02-22 VITALS — BP 151/71 | HR 62 | Temp 98.3°F | Ht 69.0 in | Wt 182.0 lb

## 2022-02-22 DIAGNOSIS — M5489 Other dorsalgia: Secondary | ICD-10-CM

## 2022-02-22 NOTE — Progress Notes (Signed)
BP (!) 151/71   Pulse 62   Temp 98.3 F (36.8 C)   Ht _0  (1.753 m)   Wt 182 lb (82.6 kg)   SpO2 98%   BMI 26.88 kg/m    Subjective:   Patient ID: Philip Huang, male    DOB: 23-Apr-1951, 71 y.o.   MRN: [id number]  HPI: Philip Huang is a 71 y.o. male presenting on 02/22/2022 for Hip Pain and Back Pain   HPI Patient is coming in for continued back pain.  He says is been going on for at least 6 months but more recently has worsened.  He was seen on 01/31/2022 for this and treated with steroids and muscle relaxers.  The then went to the urgent care and had x-ray and was treated for tramadol and Voltaren and he says the tramadol does help in the muscle relaxers help as well.  He was using Tylenol and ibuprofen as well.  He denies any pain or radiation down either of his legs.  He says that the pain is better with standing but worse with lifting and picking up leg and bending and laying on the right side which is the opposite side of where he has the pain.  The pain is on his left lower back near his belt line.  Relevant past medical, surgical, family and social history reviewed and updated as indicated. Interim medical history since our last visit reviewed. Allergies and medications reviewed and updated.  Review of Systems  Constitutional:  Negative for chills and fever.  Respiratory:  Negative for shortness of breath and wheezing.   Cardiovascular:  Negative for chest pain and leg swelling.  Musculoskeletal:  Positive for arthralgias, back pain and myalgias. Negative for gait problem.  Skin:  Negative for rash.  Neurological:  Negative for weakness and numbness.  All other systems reviewed and are negative.   Per HPI unless specifically indicated above   Allergies as of 02/22/2022       Reactions   Doxycycline    Sulfa Antibiotics Rash        Medication List        Accurate as of February 22, 2022  9:36 AM. If you have any questions, ask your nurse or doctor.           STOP taking these medications    predniSONE 10 MG tablet Commonly known as: DELTASONE Stopped by: Fransisca Kaufmann Carman Essick, MD       TAKE these medications    Accu-Chek Aviva Plus test strip Generic drug: glucose blood TEST BLOOD SUGAR TWICE DAILY   Accu-Chek Softclix Lancets lancets TEST BLOOD SUGAR TWICE DAILY Dx E11.9   aspirin 81 MG tablet Take 81 mg by mouth daily.   DropSafe Alcohol Prep 70 % Pads USE AS DIRECTED TWICE DAILY   empagliflozin 25 MG Tabs tablet Commonly known as: Jardiance Take 1 tablet (25 mg total) by mouth daily before breakfast.   fenofibrate 160 MG tablet Take 1 tablet (160 mg total) by mouth daily.   fish oil-omega-3 fatty acids 1000 MG capsule Take 1 g by mouth daily.   glipiZIDE 10 MG 24 hr tablet Commonly known as: GLUCOTROL XL TAKE 1 TABLET EVERY DAY WITH BREAKFAST   ibuprofen 600 MG tablet Commonly known as: ADVIL Take 1 tablet (600 mg total) by mouth every 8 (eight) hours as needed.   lisinopril-hydrochlorothiazide 20-12.5 MG tablet Commonly known as: ZESTORETIC Take 2 tablets by mouth daily.   MENS 50+ ADVANCED  PO Take by mouth.   metFORMIN 1000 MG tablet Commonly known as: GLUCOPHAGE Take 1 tablet (1,000 mg total) by mouth 2 (two) times daily with a meal.   methocarbamol 500 MG tablet Commonly known as: ROBAXIN Take 1 tablet (500 mg total) by mouth 4 (four) times daily.   nitroGLYCERIN 0.4 MG SL tablet Commonly known as: NITROSTAT Place 1 tablet (0.4 mg total) under the tongue every 5 (five) minutes as needed.   rosuvastatin 40 MG tablet Commonly known as: CRESTOR TAKE 1 TABLET EVERY DAY   Semaglutide 14 MG Tabs Take 14 mg by mouth daily.   Shingrix injection Generic drug: Zoster Vaccine Adjuvanted   True Metrix Air Glucose Meter w/Device Kit Test BS BID Dx E11.9   True Metrix Meter w/Device Kit USE AS DIRECTED   Vitamin D 50 MCG (2000 UT) tablet Take 2,000 Units by mouth daily.          Objective:   BP (!) 151/71   Pulse 62   Temp 98.3 F (36.8 C)   Ht _0  (1.753 m)   Wt 182 lb (82.6 kg)   SpO2 98%   BMI 26.88 kg/m   Wt Readings from Last 3 Encounters:  02/22/22 182 lb (82.6 kg)  01/31/22 180 lb 12.8 oz (82 kg)  01/15/22 184 lb (83.5 kg)    Physical Exam Vitals and nursing note reviewed.  Constitutional:      General: He is not in acute distress.    Appearance: He is well-developed. He is not diaphoretic.  Eyes:     General: No scleral icterus.    Conjunctiva/sclera: Conjunctivae normal.  Neck:     Thyroid: No thyromegaly.  Musculoskeletal:        General: Normal range of motion.     Lumbar back: Tenderness present. No swelling or deformity. Normal range of motion. Negative right straight leg raise test and negative left straight leg raise test.       Back:  Skin:    General: Skin is warm and dry.     Findings: No rash.  Neurological:     Mental Status: He is alert and oriented to person, place, and time.     Coordination: Coordination normal.  Psychiatric:        Behavior: Behavior normal.     Lumbar spine x-ray performed on 02/19/2022 at [facility]: Mild degenerative joint changes of the lumbar spine at L5-S1  Assessment & Plan:   Problem List Items Addressed This Visit   None Visit Diagnoses     Back pain without sciatica    -  Primary   Relevant Orders   Ambulatory referral to Physical Therapy       Continue his muscle relaxer and tramadol that he has left.  We will send to physical therapy to be evaluated, if still not better then may consider MRI in the future. Follow up plan: Return if symptoms worsen or fail to improve.  Counseling provided for all of the vaccine components Orders Placed This Encounter  Procedures   Ambulatory referral to Physical Therapy    Caryl Pina, MD [facility] 02/22/2022, 9:36 AM

## 2022-02-28 ENCOUNTER — Other Ambulatory Visit: Payer: Self-pay

## 2022-02-28 ENCOUNTER — Ambulatory Visit: Payer: Medicare HMO | Attending: Family Medicine

## 2022-02-28 DIAGNOSIS — M5459 Other low back pain: Secondary | ICD-10-CM | POA: Diagnosis not present

## 2022-02-28 DIAGNOSIS — M5489 Other dorsalgia: Secondary | ICD-10-CM | POA: Diagnosis not present

## 2022-02-28 NOTE — Therapy (Addendum)
OUTPATIENT PHYSICAL THERAPY THORACOLUMBAR EVALUATION   Patient Name: Philip Huang MRN: [id number] DOB:Oct 03, 1950, 71 y.o., male Today's Date: 02/28/2022   PT End of Session - 02/28/22 0902     Visit Number 1    Number of Visits 3    Date for PT Re-Evaluation 03/30/22    PT Start Time 0903    PT Stop Time 0938    PT Time Calculation (min) 35 min    Activity Tolerance Patient tolerated treatment well    Behavior During Therapy [facility] for tasks assessed/performed             Past Medical History:  Diagnosis Date   Diabetes mellitus    HTN (hypertension)    Hyperlipidemia    Overweight(278.02)    Squamous acanthoma of upper extremity    Past Surgical History:  Procedure Laterality Date   None     squamous acanthoma of upper extremity     Patient Active Problem List   Diagnosis Date Noted   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 03/02/2016   BMI 28.0-28.9,adult    Hyperlipidemia associated with type 2 diabetes mellitus ([place]) 12/19/2010   Hypertension associated with diabetes ([place]) 12/19/2010   Type II diabetes mellitus ([place]) 12/19/2010    PCP: [name], Fransisca Kaufmann, MD  REFERRING PROVIDER: [name], Fransisca Kaufmann, MD  REFERRING DIAG: Back pain without sciatica  Rationale for Evaluation and Treatment Rehabilitation  THERAPY DIAG:  Other low back pain  ONSET DATE: 6 months ago  SUBJECTIVE:                                                                                                                                                                                           SUBJECTIVE STATEMENT: Patient reports that he pulled something in his left hip a while ago which caused his low back to hurt. However, his left hip and low back have begun to feel better, but the right side of his low back still hurts. He notes that he has never had any pain like this before.  PERTINENT HISTORY:   HTN and DM   PAIN:  Are you having pain? Yes: NPRS scale:  7-8/10 Pain location: right low back  Pain description: stiff, ache, sore, and intermittent Aggravating factors: sleeping on his right side, prolonged standing on cement  Relieving factors: movement   PRECAUTIONS: None  WEIGHT BEARING RESTRICTIONS No  FALLS:  Has patient fallen in last 6 months? No  LIVING ENVIRONMENT: Lives with: lives with their family Lives in: House/apartment   OCCUPATION: part time; standing for about 5 of 7 hours  PLOF: Independent  PATIENT GOALS reduced stiffness and soreness, stand longer on concrete    OBJECTIVE:   SCREENING FOR RED FLAGS: Bowel or bladder incontinence: No Spinal tumors: No Cauda equina syndrome: No Compression fracture: No Abdominal aneurysm: No  COGNITION:  Overall cognitive status: Within functional limits for tasks assessed     SENSATION: WFL  POSTURE: rounded shoulders, forward head, flexed trunk , and weight shift right  PALPATION: Palpable tightness along right thoracolumbar paraspinals, but nonpainful  LUMBAR ROM:   Active  A/PROM  eval  Flexion 40 degrees prior to knee flexion  Extension 20  Right lateral flexion 25% limited and "stiff"  Left lateral flexion 25% limited  Right rotation 50% limited and "stiff"  Left rotation 25% limited   (Blank rows = not tested)  LOWER EXTREMITY ROM:  WFL for tasked assessed   LOWER EXTREMITY MMT:    MMT Right eval Left eval  Hip flexion 4/5 4+/5  Hip extension    Hip abduction    Hip adduction    Hip internal rotation    Hip external rotation    Knee flexion 4+/5 4+/5  Knee extension 5/5 5/5  Ankle dorsiflexion    Ankle plantarflexion    Ankle inversion    Ankle eversion     (Blank rows = not tested)  LUMBAR SPECIAL TESTS:  Straight leg raise test: Negative  GAIT: Assistive device utilized: None Level of assistance: Complete Independence Comments: WFL    TODAY'S TREATMENT                                    7/26 EXERCISE LOG  Exercise  Repetitions and Resistance Comments  Lower trunk rotations 10 reps    Single knee to chest  RLE only; 2 x 30"    Seated hamstring stretch 1 x 30"   Standing open books 5 reps         Blank cell = exercise not performed today    PATIENT EDUCATION:  Education details: HEP, prognosis, POC, healing Person educated: Patient Education method: Explanation Education comprehension: verbalized understanding   HOME EXERCISE PROGRAM: [id number]  ASSESSMENT:  CLINICAL IMPRESSION: Patient is a 71 y.o. male who was seen today for physical therapy evaluation and treatment for chronic low back stiffness. He presented with low pain severity and irritability with right rotation and side bending slightly increasing his familiar stiffness. He was provided a HEP which he was able to properly perform with minimal cueing. He reported feeling comfortable with these interventions. Recommend that he continue with skilled physical therapy to address his remaining impairments to return to his prior level of function.   PHYSICAL THERAPY DISCHARGE SUMMARY  Visits from Start of Care: 1  Current functional level related to goals / functional outcomes: Patient is being discharged at this time as he did not return following his initial evaluation.    Remaining deficits: See objective assessments   Education / Equipment: HEP    Patient agrees to discharge. Patient goals were not met. Patient is being discharged due to not returning since the last visit.   OBJECTIVE IMPAIRMENTS decreased ROM and pain.   ACTIVITY LIMITATIONS bending and standing  PARTICIPATION LIMITATIONS: community activity and occupation  PERSONAL FACTORS Time since onset of injury/illness/exacerbation and 1-2 comorbidities: HTN and DM  are also affecting patient's functional outcome.   REHAB POTENTIAL: Excellent  CLINICAL DECISION MAKING: Stable/uncomplicated  EVALUATION COMPLEXITY: Low   GOALS:  Goals reviewed with patient?  Yes  LONG TERM GOALS: Target date: 03/21/2022  Patient will be independent with his HEP.  Baseline:  Goal status: INITIAL  2.  Patient will be able to complete his daily activities without his familiar pain exceeding a 2/10.  Baseline:  Goal status: INITIAL  3.  Patient will be able to lift at least 15 pounds from the floor without being limited by his familiar low back pain.  Baseline:  Goal status: INITIAL   PLAN: PT FREQUENCY: 1x/week  PT DURATION: 3 weeks  PLANNED INTERVENTIONS: Therapeutic exercises, Therapeutic activity, Neuromuscular re-education, Patient/Family education, Joint mobilization, Dry Needling, Electrical stimulation, Spinal mobilization, Cryotherapy, Moist heat, Manual therapy, and Re-evaluation.  PLAN FOR NEXT SESSION: review HEP and modify as needed   Darlin Coco, PT 02/28/2022, 2:49 PM

## 2022-03-07 ENCOUNTER — Ambulatory Visit: Payer: Medicare HMO

## 2022-03-30 LAB — POC FIT TEST STOOL: Fecal Occult Blood: NEGATIVE

## 2022-04-02 ENCOUNTER — Ambulatory Visit: Payer: Medicare HMO | Admitting: Family Medicine

## 2022-04-02 ENCOUNTER — Other Ambulatory Visit: Payer: Self-pay | Admitting: Family Medicine

## 2022-04-02 DIAGNOSIS — E1169 Type 2 diabetes mellitus with other specified complication: Secondary | ICD-10-CM

## 2022-04-10 ENCOUNTER — Telehealth: Payer: Self-pay | Admitting: Family Medicine

## 2022-04-10 DIAGNOSIS — E1169 Type 2 diabetes mellitus with other specified complication: Secondary | ICD-10-CM

## 2022-04-10 NOTE — Telephone Encounter (Signed)
Pt had labs in May. Only A1C needed.

## 2022-04-17 ENCOUNTER — Other Ambulatory Visit: Payer: Medicare HMO

## 2022-04-17 DIAGNOSIS — E1169 Type 2 diabetes mellitus with other specified complication: Secondary | ICD-10-CM

## 2022-04-17 LAB — BAYER DCA HB A1C WAIVED: HB A1C (BAYER DCA - WAIVED): 7.5 % — ABNORMAL HIGH (ref 4.8–5.6)

## 2022-04-20 ENCOUNTER — Ambulatory Visit (INDEPENDENT_AMBULATORY_CARE_PROVIDER_SITE_OTHER): Payer: Medicare HMO | Admitting: Family Medicine

## 2022-04-20 ENCOUNTER — Encounter: Payer: Self-pay | Admitting: Family Medicine

## 2022-04-20 VITALS — BP 136/64 | HR 81 | Temp 97.5°F | Wt 182.0 lb

## 2022-04-20 DIAGNOSIS — E1159 Type 2 diabetes mellitus with other circulatory complications: Secondary | ICD-10-CM

## 2022-04-20 DIAGNOSIS — E785 Hyperlipidemia, unspecified: Secondary | ICD-10-CM

## 2022-04-20 DIAGNOSIS — I152 Hypertension secondary to endocrine disorders: Secondary | ICD-10-CM | POA: Diagnosis not present

## 2022-04-20 DIAGNOSIS — E1169 Type 2 diabetes mellitus with other specified complication: Secondary | ICD-10-CM

## 2022-04-20 DIAGNOSIS — Z23 Encounter for immunization: Secondary | ICD-10-CM

## 2022-04-20 MED ORDER — FENOFIBRATE 160 MG PO TABS: 160.0000 mg | ORAL_TABLET | Freq: Every day | ORAL | 3 refills | Status: DC

## 2022-04-20 MED ORDER — METFORMIN HCL 1000 MG PO TABS: 1000.0000 mg | ORAL_TABLET | Freq: Two times a day (BID) | ORAL | 3 refills | Status: DC

## 2022-04-20 MED ORDER — GLIPIZIDE ER 10 MG PO TB24: 10.0000 mg | ORAL_TABLET | Freq: Every day | ORAL | 3 refills | Status: DC

## 2022-04-20 MED ORDER — LISINOPRIL-HYDROCHLOROTHIAZIDE 20-12.5 MG PO TABS: 2.0000 | ORAL_TABLET | Freq: Every day | ORAL | 3 refills | Status: DC

## 2022-04-20 MED ORDER — EMPAGLIFLOZIN 25 MG PO TABS: 25.0000 mg | ORAL_TABLET | Freq: Every day | ORAL | 3 refills | Status: DC

## 2022-04-20 NOTE — Progress Notes (Signed)
BP 136/64   Pulse 81   Temp (!) 97.5 F (36.4 C)   Wt 182 lb (82.6 kg)   SpO2 96%   BMI 26.88 kg/m    Subjective:   Patient ID: Philip Huang, male    DOB: Oct 25, 1950, 71 y.o.   MRN: [id number]  HPI: Philip Huang is a 71 y.o. male presenting on 04/20/2022 for Medical Management of Chronic Issues, Hypertension, and Diabetes   HPI Type 2 diabetes mellitus Patient comes in today for recheck of his diabetes. Patient has been currently taking Jardiance and Ozempic and glipizide. Patient is currently on an ACE inhibitor/ARB. Patient has not seen an ophthalmologist this year. Patient denies any issues with their feet. The symptom started onset as an adult hypertension and hyperlipidemia ARE RELATED TO DM   Hypertension Patient is currently on lisinopril hydrochlorothiazide, and their blood pressure today is 136/64. Patient denies any lightheadedness or dizziness. Patient denies headaches, blurred vision, chest pains, shortness of breath, or weakness. Denies any side effects from medication and is content with current medication.   Hyperlipidemia Patient is coming in for recheck of his hyperlipidemia. The patient is currently taking Crestor. They deny any issues with myalgias or history of liver damage from it. They deny any focal numbness or weakness or chest pain.   Relevant past medical, surgical, family and social history reviewed and updated as indicated. Interim medical history since our last visit reviewed. Allergies and medications reviewed and updated.  Review of Systems  Constitutional:  Negative for chills and fever.  Eyes:  Negative for visual disturbance.  Respiratory:  Negative for shortness of breath and wheezing.   Cardiovascular:  Negative for chest pain and leg swelling.  Musculoskeletal:  Negative for back pain and gait problem.  Skin:  Negative for rash.  Neurological:  Negative for dizziness, weakness and light-headedness.  All other systems reviewed and  are negative.   Per HPI unless specifically indicated above   Allergies as of 04/20/2022       Reactions   Doxycycline    Sulfa Antibiotics Rash        Medication List        Accurate as of April 20, 2022  3:17 PM. If you have any questions, ask your nurse or doctor.          STOP taking these medications    ibuprofen 600 MG tablet Commonly known as: ADVIL Stopped by: Fransisca Kaufmann Rodney Yera, MD   methocarbamol 500 MG tablet Commonly known as: ROBAXIN Stopped by: Fransisca Kaufmann Herta Hink, MD   Shingrix injection Generic drug: Zoster Vaccine Adjuvanted Stopped by: Fransisca Kaufmann Aldene Hendon, MD       TAKE these medications    Accu-Chek Aviva Plus test strip Generic drug: glucose blood TEST BLOOD SUGAR TWICE DAILY   Accu-Chek Softclix Lancets lancets TEST BLOOD SUGAR TWICE DAILY Dx E11.9   aspirin 81 MG tablet Take 81 mg by mouth daily.   DropSafe Alcohol Prep 70 % Pads USE AS DIRECTED TWICE DAILY   empagliflozin 25 MG Tabs tablet Commonly known as: Jardiance Take 1 tablet (25 mg total) by mouth daily before breakfast.   fenofibrate 160 MG tablet Take 1 tablet (160 mg total) by mouth daily.   fish oil-omega-3 fatty acids 1000 MG capsule Take 1 g by mouth daily.   glipiZIDE 10 MG 24 hr tablet Commonly known as: GLUCOTROL XL Take 1 tablet (10 mg total) by mouth daily with breakfast. What changed: See the new instructions.  Changed by: Fransisca Kaufmann Bernadett Milian, MD   lisinopril-hydrochlorothiazide 20-12.5 MG tablet Commonly known as: ZESTORETIC Take 2 tablets by mouth daily.   MENS 50+ ADVANCED PO Take by mouth.   metFORMIN 1000 MG tablet Commonly known as: GLUCOPHAGE Take 1 tablet (1,000 mg total) by mouth 2 (two) times daily with a meal.   nitroGLYCERIN 0.4 MG SL tablet Commonly known as: NITROSTAT Place 1 tablet (0.4 mg total) under the tongue every 5 (five) minutes as needed.   rosuvastatin 40 MG tablet Commonly known as: CRESTOR TAKE 1 TABLET EVERY  DAY   Semaglutide 14 MG Tabs Take 14 mg by mouth daily.   True Metrix Air Glucose Meter w/Device Kit Test BS BID Dx E11.9   True Metrix Meter w/Device Kit USE AS DIRECTED   Vitamin D 50 MCG (2000 UT) tablet Take 2,000 Units by mouth daily.         Objective:   BP 136/64   Pulse 81   Temp (!) 97.5 F (36.4 C)   Wt 182 lb (82.6 kg)   SpO2 96%   BMI 26.88 kg/m   Wt Readings from Last 3 Encounters:  04/20/22 182 lb (82.6 kg)  02/22/22 182 lb (82.6 kg)  01/31/22 180 lb 12.8 oz (82 kg)    Physical Exam Vitals and nursing note reviewed.  Constitutional:      General: He is not in acute distress.    Appearance: He is well-developed. He is not diaphoretic.  Eyes:     General: No scleral icterus.    Conjunctiva/sclera: Conjunctivae normal.  Neck:     Thyroid: No thyromegaly.  Cardiovascular:     Rate and Rhythm: Normal rate and regular rhythm.     Heart sounds: Normal heart sounds. No murmur heard. Pulmonary:     Effort: Pulmonary effort is normal. No respiratory distress.     Breath sounds: Normal breath sounds. No wheezing.  Musculoskeletal:     Cervical back: Neck supple.  Lymphadenopathy:     Cervical: No cervical adenopathy.  Skin:    General: Skin is warm and dry.     Findings: No rash.  Neurological:     Mental Status: He is alert and oriented to person, place, and time.     Coordination: Coordination normal.  Psychiatric:        Behavior: Behavior normal.     Results for orders placed or performed in visit on 04/17/22  Bayer DCA Hb A1c Waived  Result Value Ref Range   HB A1C (BAYER DCA - WAIVED) 7.5 (H) 4.8 - 5.6 %    Assessment & Plan:   Problem List Items Addressed This Visit       Cardiovascular and Mediastinum   Hypertension associated with diabetes ([place])   Relevant Medications   empagliflozin (JARDIANCE) 25 MG TABS tablet   fenofibrate 160 MG tablet   glipiZIDE (GLUCOTROL XL) 10 MG 24 hr tablet   lisinopril-hydrochlorothiazide  (ZESTORETIC) 20-12.5 MG tablet   metFORMIN (GLUCOPHAGE) 1000 MG tablet     Endocrine   Type II diabetes mellitus (HCC) - Primary (Chronic)   Relevant Medications   empagliflozin (JARDIANCE) 25 MG TABS tablet   glipiZIDE (GLUCOTROL XL) 10 MG 24 hr tablet   lisinopril-hydrochlorothiazide (ZESTORETIC) 20-12.5 MG tablet   metFORMIN (GLUCOPHAGE) 1000 MG tablet   Hyperlipidemia associated with type 2 diabetes mellitus (HCC)   Relevant Medications   empagliflozin (JARDIANCE) 25 MG TABS tablet   fenofibrate 160 MG tablet   glipiZIDE (GLUCOTROL XL) 10  MG 24 hr tablet   lisinopril-hydrochlorothiazide (ZESTORETIC) 20-12.5 MG tablet   metFORMIN (GLUCOPHAGE) 1000 MG tablet  Patient is A1c 7.5.  He wants to try focus on diet again before changing medication but my recommendation possibly to change on medication would be to switch from Rybelsus to Ozempic and increase the dose.  Follow up plan: Return in about 3 months (around 07/20/2022), or if symptoms worsen or fail to improve, for dm and htn and hld.  Counseling provided for all of the vaccine components No orders of the defined types were placed in this encounter.   Caryl Pina, MD [facility] 04/20/2022, 3:17 PM

## 2022-04-27 ENCOUNTER — Telehealth: Payer: Self-pay

## 2022-04-27 NOTE — Telephone Encounter (Signed)
Patient's wife was asking if patient should get a DXA due to his age - please advise

## 2022-04-27 NOTE — Telephone Encounter (Signed)
Yes he can get a DEXA, it is not something we commonly think about in men but he is of age where he could get 1 if he wanted.  Go ahead and schedule him for 1 if he wants

## 2022-04-30 NOTE — Telephone Encounter (Signed)
error 

## 2022-05-25 ENCOUNTER — Telehealth: Payer: Self-pay | Admitting: Family Medicine

## 2022-05-25 DIAGNOSIS — E1169 Type 2 diabetes mellitus with other specified complication: Secondary | ICD-10-CM

## 2022-05-25 DIAGNOSIS — E1159 Type 2 diabetes mellitus with other circulatory complications: Secondary | ICD-10-CM

## 2022-05-25 NOTE — Telephone Encounter (Signed)
Future orders placed 

## 2022-06-07 ENCOUNTER — Telehealth: Payer: Self-pay

## 2022-06-07 NOTE — Telephone Encounter (Signed)
Mailed Novo Nordisk renewal application to patient home.   Merridith Dershem L. CPhT Rx Patient Advocate  

## 2022-06-25 NOTE — Telephone Encounter (Signed)
Pt returned application to provider office.   Faxed novo nordisk pcp pages to Dr Building control surveyor for [employer].

## 2022-06-26 ENCOUNTER — Telehealth: Payer: Self-pay | Admitting: Pharmacist

## 2022-06-26 DIAGNOSIS — E1169 Type 2 diabetes mellitus with other specified complication: Secondary | ICD-10-CM

## 2022-06-26 NOTE — Telephone Encounter (Signed)
Patient (patient's wife) requesting follow up for Jardiance application She states the company hasn't received the full application Let me know if there is anything I need to do on my end  Thanks!

## 2022-07-02 NOTE — Telephone Encounter (Signed)
Patient's wife calling to check on application process. Please call back

## 2022-07-04 NOTE — Telephone Encounter (Signed)
Pt's wife will be coming by office to sign [employer] application sometime after lunch today.  I will fax application to office. Patient will need to complete/sign first 3 pages. The provider will need to complete/sign the last page.   Please fax completed application back to [phone]

## 2022-07-04 NOTE — Telephone Encounter (Signed)
DISREGARD PREVIOUS MESSAGE AND FAX!!!  Just rec'd signed application via email!!

## 2022-07-04 NOTE — Telephone Encounter (Signed)
I just sent several emails regarding his paperwork There is so much that has been stuffed in my box at work But it should be  [employer] cares app or two Can you look over? I sent provider portion too She may not need to come in now?

## 2022-07-05 NOTE — Telephone Encounter (Signed)
Submitted RE-ENROLLMENT  for JARDIANCE to [employer]) for patient assistance.   Phone: [phone]

## 2022-07-09 NOTE — Telephone Encounter (Signed)
Received notification from [employer]) regarding approval for [employer]. Patient assistance approved from 08/06/22 to 08/06/23.  Phone: [phone]

## 2022-07-18 ENCOUNTER — Other Ambulatory Visit: Payer: Medicare HMO

## 2022-07-18 DIAGNOSIS — I152 Hypertension secondary to endocrine disorders: Secondary | ICD-10-CM

## 2022-07-18 DIAGNOSIS — E785 Hyperlipidemia, unspecified: Secondary | ICD-10-CM | POA: Diagnosis not present

## 2022-07-18 DIAGNOSIS — E1169 Type 2 diabetes mellitus with other specified complication: Secondary | ICD-10-CM | POA: Diagnosis not present

## 2022-07-18 DIAGNOSIS — E1159 Type 2 diabetes mellitus with other circulatory complications: Secondary | ICD-10-CM | POA: Diagnosis not present

## 2022-07-18 LAB — BAYER DCA HB A1C WAIVED: HB A1C (BAYER DCA - WAIVED): 8.3 % — ABNORMAL HIGH (ref 4.8–5.6)

## 2022-07-19 LAB — CMP14+EGFR
ALT: 28 IU/L (ref 0–44)
AST: 25 IU/L (ref 0–40)
Albumin/Globulin Ratio: 1.9 (ref 1.2–2.2)
Albumin: 4.6 g/dL (ref 3.9–4.9)
Alkaline Phosphatase: 43 IU/L — ABNORMAL LOW (ref 44–121)
BUN/Creatinine Ratio: 18 (ref 10–24)
BUN: 22 mg/dL (ref 8–27)
Bilirubin Total: 0.4 mg/dL (ref 0.0–1.2)
CO2: 25 mmol/L (ref 20–29)
Calcium: 9.8 mg/dL (ref 8.6–10.2)
Chloride: 98 mmol/L (ref 96–106)
Creatinine, Ser: 1.23 mg/dL (ref 0.76–1.27)
Globulin, Total: 2.4 g/dL (ref 1.5–4.5)
Glucose: 121 mg/dL — ABNORMAL HIGH (ref 70–99)
Potassium: 4.5 mmol/L (ref 3.5–5.2)
Sodium: 139 mmol/L (ref 134–144)
Total Protein: 7 g/dL (ref 6.0–8.5)
eGFR: 63 mL/min/{1.73_m2} (ref >59)

## 2022-07-19 LAB — CBC WITH DIFFERENTIAL/PLATELET
Basophils Absolute: 0.1 10*3/uL (ref 0.0–0.2)
Basos: 1 %
EOS (ABSOLUTE): 0.3 10*3/uL (ref 0.0–0.4)
Eos: 4 %
Hematocrit: 43.6 % (ref 37.5–51.0)
Hemoglobin: 15 g/dL (ref 13.0–17.7)
Immature Grans (Abs): 0 10*3/uL (ref 0.0–0.1)
Immature Granulocytes: 0 %
Lymphocytes Absolute: 2.1 10*3/uL (ref 0.7–3.1)
Lymphs: 30 %
MCH: 30.4 pg (ref 26.6–33.0)
MCHC: 34.4 g/dL (ref 31.5–35.7)
MCV: 88 fL (ref 79–97)
Monocytes Absolute: 0.5 10*3/uL (ref 0.1–0.9)
Monocytes: 7 %
Neutrophils Absolute: 4 10*3/uL (ref 1.4–7.0)
Neutrophils: 58 %
Platelets: 240 10*3/uL (ref 150–450)
RBC: 4.93 x10E6/uL (ref 4.14–5.80)
RDW: 12.7 % (ref 11.6–15.4)
WBC: 6.9 10*3/uL (ref 3.4–10.8)

## 2022-07-19 LAB — LIPID PANEL
Chol/HDL Ratio: 4.2 ratio (ref 0.0–5.0)
Cholesterol, Total: 168 mg/dL (ref 100–199)
HDL: 40 mg/dL (ref >39)
LDL Chol Calc (NIH): 102 mg/dL — ABNORMAL HIGH (ref 0–99)
Triglycerides: 146 mg/dL (ref 0–149)
VLDL Cholesterol Cal: 26 mg/dL (ref 5–40)

## 2022-07-23 ENCOUNTER — Encounter: Payer: Self-pay | Admitting: Family Medicine

## 2022-07-23 ENCOUNTER — Ambulatory Visit (INDEPENDENT_AMBULATORY_CARE_PROVIDER_SITE_OTHER): Payer: Medicare HMO | Admitting: Family Medicine

## 2022-07-23 VITALS — BP 138/70 | HR 64 | Temp 98.0°F | Ht 69.0 in | Wt 183.0 lb

## 2022-07-23 DIAGNOSIS — I152 Hypertension secondary to endocrine disorders: Secondary | ICD-10-CM | POA: Diagnosis not present

## 2022-07-23 DIAGNOSIS — E1169 Type 2 diabetes mellitus with other specified complication: Secondary | ICD-10-CM | POA: Diagnosis not present

## 2022-07-23 DIAGNOSIS — E1159 Type 2 diabetes mellitus with other circulatory complications: Secondary | ICD-10-CM

## 2022-07-23 DIAGNOSIS — E785 Hyperlipidemia, unspecified: Secondary | ICD-10-CM | POA: Diagnosis not present

## 2022-07-23 MED ORDER — SEMAGLUTIDE (1 MG/DOSE) 4 MG/3ML ~~LOC~~ SOPN: 1.0000 mg | PEN_INJECTOR | SUBCUTANEOUS | 3 refills | Status: DC

## 2022-07-23 NOTE — Progress Notes (Signed)
BP 138/70   Pulse 64   Temp 98 F (36.7 C)   Ht 5' 9" (1.753 m)   Wt 183 lb (83 kg)   SpO2 98%   BMI 27.02 kg/m    Subjective:   Patient ID: Philip Huang, male    DOB: 1951-02-21, 71 y.o.   MRN: [id number]  HPI: Philip Huang is a 71 y.o. male presenting on 07/23/2022 for Medical Management of Chronic Issues, Diabetes, and Hypertension   HPI Type 2 diabetes mellitus Patient comes in today for recheck of his diabetes. Patient has been currently taking glipizide and Rybelsus and metformin and Jardiance, A1c is up to 8.5 today.. Patient is currently on an ACE inhibitor/ARB. Patient has seen an ophthalmologist this year. Patient denies any issues with their feet. The symptom started onset as an adult hypertension and hyperlipidemia ARE RELATED TO DM   Hypertension Patient is currently on lisinopril hydrochlorothiazide, and their blood pressure today is 138/70. Patient denies any lightheadedness or dizziness. Patient denies headaches, blurred vision, chest pains, shortness of breath, or weakness. Denies any side effects from medication and is content with current medication.   Hyperlipidemia Patient is coming in for recheck of his hyperlipidemia. The patient is currently taking Crestor. They deny any issues with myalgias or history of liver damage from it. They deny any focal numbness or weakness or chest pain.   Relevant past medical, surgical, family and social history reviewed and updated as indicated. Interim medical history since our last visit reviewed. Allergies and medications reviewed and updated.  Review of Systems  Constitutional:  Negative for chills and fever.  Eyes:  Negative for visual disturbance.  Respiratory:  Negative for shortness of breath and wheezing.   Cardiovascular:  Negative for chest pain and leg swelling.  Musculoskeletal:  Negative for back pain and gait problem.  Skin:  Negative for rash.  Neurological:  Negative for dizziness, weakness and  numbness.  All other systems reviewed and are negative.   Per HPI unless specifically indicated above   Allergies as of 07/23/2022       Reactions   Doxycycline    Sulfa Antibiotics Rash        Medication List        Accurate as of July 23, 2022 11:22 AM. If you have any questions, ask your nurse or doctor.          STOP taking these medications    Semaglutide 14 MG Tabs Replaced by: Semaglutide (1 MG/DOSE) 4 MG/3ML Sopn Stopped by: Fransisca Kaufmann [name], MD       TAKE these medications    Accu-Chek Aviva Plus test strip Generic drug: glucose blood TEST BLOOD SUGAR TWICE DAILY   Accu-Chek Softclix Lancets lancets TEST BLOOD SUGAR TWICE DAILY Dx E11.9   aspirin 81 MG tablet Take 81 mg by mouth daily.   DropSafe Alcohol Prep 70 % Pads USE AS DIRECTED TWICE DAILY   empagliflozin 25 MG Tabs tablet Commonly known as: Jardiance Take 1 tablet (25 mg total) by mouth daily before breakfast.   fenofibrate 160 MG tablet Take 1 tablet (160 mg total) by mouth daily.   fish oil-omega-3 fatty acids 1000 MG capsule Take 1 g by mouth daily.   glipiZIDE 10 MG 24 hr tablet Commonly known as: GLUCOTROL XL Take 1 tablet (10 mg total) by mouth daily with breakfast.   lisinopril-hydrochlorothiazide 20-12.5 MG tablet Commonly known as: ZESTORETIC Take 2 tablets by mouth daily.   MENS 50+ ADVANCED  PO Take by mouth.   metFORMIN 1000 MG tablet Commonly known as: GLUCOPHAGE Take 1 tablet (1,000 mg total) by mouth 2 (two) times daily with a meal.   nitroGLYCERIN 0.4 MG SL tablet Commonly known as: NITROSTAT Place 1 tablet (0.4 mg total) under the tongue every 5 (five) minutes as needed.   rosuvastatin 40 MG tablet Commonly known as: CRESTOR TAKE 1 TABLET EVERY DAY   Semaglutide (1 MG/DOSE) 4 MG/3ML Sopn Inject 1 mg as directed once a week. Replaces: Semaglutide 14 MG Tabs Started by: Worthy Rancher, MD   True Metrix Air Glucose Meter w/Device Kit Test  BS BID Dx E11.9   True Metrix Meter w/Device Kit USE AS DIRECTED   Vitamin D 50 MCG (2000 UT) tablet Take 2,000 Units by mouth daily.         Objective:   BP 138/70   Pulse 64   Temp 98 F (36.7 C)   Ht 5' 9" (1.753 m)   Wt 183 lb (83 kg)   SpO2 98%   BMI 27.02 kg/m   Wt Readings from Last 3 Encounters:  07/23/22 183 lb (83 kg)  04/20/22 182 lb (82.6 kg)  02/22/22 182 lb (82.6 kg)    Physical Exam Vitals and nursing note reviewed.  Constitutional:      General: He is not in acute distress.    Appearance: He is well-developed. He is not diaphoretic.  Eyes:     General: No scleral icterus.    Conjunctiva/sclera: Conjunctivae normal.  Neck:     Thyroid: No thyromegaly.  Cardiovascular:     Rate and Rhythm: Normal rate and regular rhythm.     Heart sounds: Normal heart sounds. No murmur heard. Pulmonary:     Effort: Pulmonary effort is normal. No respiratory distress.     Breath sounds: Normal breath sounds. No wheezing.  Musculoskeletal:        General: No swelling. Normal range of motion.     Cervical back: Neck supple.  Lymphadenopathy:     Cervical: No cervical adenopathy.  Skin:    General: Skin is warm and dry.     Findings: No rash.  Neurological:     Mental Status: He is alert and oriented to person, place, and time.     Coordination: Coordination normal.  Psychiatric:        Behavior: Behavior normal.     Results for orders placed or performed in visit on 07/18/22  Bayer DCA Hb A1c Waived  Result Value Ref Range   HB A1C (BAYER DCA - WAIVED) 8.3 (H) 4.8 - 5.6 %  CBC with Differential/Platelet  Result Value Ref Range   WBC 6.9 3.4 - 10.8 x10E3/uL   RBC 4.93 4.14 - 5.80 x10E6/uL   Hemoglobin 15.0 13.0 - 17.7 g/dL   Hematocrit 43.6 37.5 - 51.0 %   MCV 88 79 - 97 fL   MCH 30.4 26.6 - 33.0 pg   MCHC 34.4 31.5 - 35.7 g/dL   RDW 12.7 11.6 - 15.4 %   Platelets 240 150 - 450 x10E3/uL   Neutrophils 58 Not Estab. %   Lymphs 30 Not Estab. %    Monocytes 7 Not Estab. %   Eos 4 Not Estab. %   Basos 1 Not Estab. %   Neutrophils Absolute 4.0 1.4 - 7.0 x10E3/uL   Lymphocytes Absolute 2.1 0.7 - 3.1 x10E3/uL   Monocytes Absolute 0.5 0.1 - 0.9 x10E3/uL   EOS (ABSOLUTE) 0.3 0.0 - 0.4  x10E3/uL   Basophils Absolute 0.1 0.0 - 0.2 x10E3/uL   Immature Granulocytes 0 Not Estab. %   Immature Grans (Abs) 0.0 0.0 - 0.1 x10E3/uL  CMP14+EGFR  Result Value Ref Range   Glucose 121 (H) 70 - 99 mg/dL   BUN 22 8 - 27 mg/dL   Creatinine, Ser 1.23 0.76 - 1.27 mg/dL   eGFR 63 >59 mL/min/1.73   BUN/Creatinine Ratio 18 10 - 24   Sodium 139 134 - 144 mmol/L   Potassium 4.5 3.5 - 5.2 mmol/L   Chloride 98 96 - 106 mmol/L   CO2 25 20 - 29 mmol/L   Calcium 9.8 8.6 - 10.2 mg/dL   Total Protein 7.0 6.0 - 8.5 g/dL   Albumin 4.6 3.9 - 4.9 g/dL   Globulin, Total 2.4 1.5 - 4.5 g/dL   Albumin/Globulin Ratio 1.9 1.2 - 2.2   Bilirubin Total 0.4 0.0 - 1.2 mg/dL   Alkaline Phosphatase 43 (L) 44 - 121 IU/L   AST 25 0 - 40 IU/L   ALT 28 0 - 44 IU/L  Lipid panel  Result Value Ref Range   Cholesterol, Total 168 100 - 199 mg/dL   Triglycerides 146 0 - 149 mg/dL   HDL 40 >39 mg/dL   VLDL Cholesterol Cal 26 5 - 40 mg/dL   LDL Chol Calc (NIH) 102 (H) 0 - 99 mg/dL   Chol/HDL Ratio 4.2 0.0 - 5.0 ratio    Assessment & Plan:   Problem List Items Addressed This Visit       Cardiovascular and Mediastinum   Hypertension associated with diabetes ([place])   Relevant Medications   Semaglutide, 1 MG/DOSE, 4 MG/3ML SOPN     Endocrine   Type II diabetes mellitus (HCC) - Primary (Chronic)   Relevant Medications   Semaglutide, 1 MG/DOSE, 4 MG/3ML SOPN   Other Relevant Orders   Microalbumin / creatinine urine ratio   Hyperlipidemia associated with type 2 diabetes mellitus (HCC)   Relevant Medications   Semaglutide, 1 MG/DOSE, 4 MG/3ML SOPN  A1c up to 8.3, and his diet has not been the best and we discussed that extensively.  Does not seem to want to change.  We will  change his Rybelsus to Ozempic and see if that gives [place] more benefit.  Will start at 1 mg Ozempic and then increase from there.  Follow up plan: Return in about 3 months (around 10/22/2022), or if symptoms worsen or fail to improve, for Diabetes recheck.  Counseling provided for all of the vaccine components Orders Placed This Encounter  Procedures   Microalbumin / creatinine urine ratio    Caryl Pina, MD [facility] 07/23/2022, 11:22 AM

## 2022-07-24 LAB — MICROALBUMIN / CREATININE URINE RATIO
Creatinine, Urine: 32.6 mg/dL
Microalb/Creat Ratio: <9 mg/g creat (ref 0–29)
Microalbumin, Urine: <3 ug/mL

## 2022-07-26 ENCOUNTER — Telehealth: Payer: Self-pay | Admitting: Pharmacist

## 2022-07-26 NOTE — Telephone Encounter (Signed)
Can we transition patient to ozempic '1mg'$  will have to redo PAP paperwork Send to me and I will sign and send back  Thank you!

## 2022-08-07 NOTE — Telephone Encounter (Signed)
Left message informing patient that his re-enrollment is in process and to continue taking daily medication (tablet) until weekly injection is approved and shipped to office.  Left call back # [phone]

## 2022-08-17 NOTE — Telephone Encounter (Signed)
Received notification from [facility] regarding RE-ENROLLMENT approval for RYBELSUS '14MG'$ . Patient assistance approved from 08/16/22 to 08/06/23.  INFORMED REP WE WILL BE SENDING A MEDICATION CHANGE REQUEST FOR SWITCH TO OZEMPIC, SINCE THEY DID NOT RECEIVE THAT MEDICATION. RYBELSUS MAY STILL SHIP IN MEANTIME.  WILL FAX DOSE CHANGE FORM TO OFFICE.  Phone: [phone]

## 2022-08-23 ENCOUNTER — Telehealth: Payer: Self-pay | Admitting: Family Medicine

## 2022-08-24 ENCOUNTER — Other Ambulatory Visit: Payer: Self-pay | Admitting: Family Medicine

## 2022-08-24 NOTE — Telephone Encounter (Signed)
Wife states that they received a letter in the mail saying that they were approved for Rybelsus but it may take two week to get shipment. She would like to have the sample of Ozempic just in case the shipment does not arrive on time.  Sample in fridge with pts name on it. Wife will come by today or Monday to pick up.

## 2022-08-24 NOTE — Progress Notes (Unsigned)
Prescription for Ozempic is already been sent in.  Yes the need to get with Almyra Free or Reeves Forth to help with prescription assistance for it

## 2022-08-24 NOTE — Telephone Encounter (Signed)
Sent message in about prescription assistance, Ozempic is already been sent in

## 2022-08-24 NOTE — Telephone Encounter (Signed)
Patient can have an ozempic 0.'5mg'$  weekly sample while waiting Wife should know how to use He can start the next day after running out of rybelsus Patient assistance is taking 4-6 weeks We appreciate their patience as this is out of our control

## 2022-09-06 NOTE — Telephone Encounter (Signed)
Rec'd letter from [facility] regarding [employer] - invalid RX?  Can new rx be e-scribed? Or would you like me to fax an rx page to office?

## 2022-09-07 NOTE — Telephone Encounter (Signed)
I have pended a new prescription but I could send you prescribe but I do not know how to send it to [facility] cares, I could not find it, if you could somehow find the pharmacy and add it, if not we can print it and fax it.

## 2022-09-07 NOTE — Addendum Note (Signed)
Addended by: Caryl Pina on: 09/07/2022 08:05 AM   Modules accepted: Orders

## 2022-09-11 MED ORDER — EMPAGLIFLOZIN 25 MG PO TABS: 25.0000 mg | ORAL_TABLET | Freq: Every day | ORAL | 3 refills | Status: DC

## 2022-09-11 NOTE — Addendum Note (Signed)
Addended by: Lottie Dawson D on: 09/11/2022 10:51 AM   Modules accepted: Orders

## 2022-09-11 NOTE — Telephone Encounter (Signed)
Just FYI Escribed to [facility]

## 2022-09-12 ENCOUNTER — Telehealth: Payer: Self-pay | Admitting: Family Medicine

## 2022-09-13 NOTE — Telephone Encounter (Signed)
Spoke with patients wife regarding novo nordisk enrollment for [employer].   Says she spoke with the company regarding ozempic RX and was told they had an rx on file for this medication. I'm not sure where/when they rec'd this RX but hopefully it is in process of shipping. Informed her I was waiting on a signature from the doctor (which was faxed 08/20/22 and again today) and once it's received I will still send the medication change request, just to have proof on our end as well.   Provided my direct line for further PAP questions [phone])

## 2022-09-13 NOTE — Telephone Encounter (Signed)
Spouse comes in and she called [facility] about Ozempic and was told they sent some Rybelsus here and patient is going to Ozempic and don't need Rybelsus anymore. [facility] will be sending Ozempic here and spouse wanted to make sure we knew.

## 2022-09-17 ENCOUNTER — Telehealth: Payer: Self-pay | Admitting: Family Medicine

## 2022-09-17 NOTE — Telephone Encounter (Signed)
Pts wife called stating that patient assistance messed up pts order and now pt is not going to get his Vania Rea Rx until 7-10 business days. Pt is out of his Jardiance and needs samples. Do we have any?   Please advise and call patient or wife.

## 2022-09-17 NOTE — Telephone Encounter (Signed)
We do not have samples of Jardiance. Per [name] pt can take Farxiga 61m once daily. 2 boxes left up front to pick up. Wife made aware.

## 2022-09-26 ENCOUNTER — Telehealth: Payer: Self-pay

## 2022-09-26 NOTE — Telephone Encounter (Signed)
Patient's wife advised that Ozempic has been received and will be placed in the refrigerator for pick up.

## 2022-10-12 NOTE — Telephone Encounter (Signed)
Received notification from [facility] regarding approval for OZEMPIC '1mg'$  dose pens. Patient assistance approved from 09/14/22 to 08/01/23.  Phone: [phone]

## 2022-10-15 ENCOUNTER — Telehealth: Payer: Self-pay | Admitting: Family Medicine

## 2022-10-15 NOTE — Telephone Encounter (Signed)
Patient would like to have lab orders added. Please call when ready

## 2022-10-18 NOTE — Telephone Encounter (Signed)
Pts wife informed. She will wait until the day of the appt to go to lab.

## 2022-10-18 NOTE — Telephone Encounter (Signed)
He just needs an A1c this time, so if he still wants to come in before he can get the A1c done but if not we can also do it on the same day we can get it back within the time slot but he does not need other labs at this time

## 2022-10-24 ENCOUNTER — Ambulatory Visit: Payer: Medicare HMO | Admitting: Family Medicine

## 2022-11-16 ENCOUNTER — Ambulatory Visit (INDEPENDENT_AMBULATORY_CARE_PROVIDER_SITE_OTHER): Payer: Medicare HMO | Admitting: Family Medicine

## 2022-11-16 VITALS — BP 124/71 | HR 64 | Ht 69.0 in | Wt 180.0 lb

## 2022-11-16 DIAGNOSIS — E1159 Type 2 diabetes mellitus with other circulatory complications: Secondary | ICD-10-CM

## 2022-11-16 DIAGNOSIS — I152 Hypertension secondary to endocrine disorders: Secondary | ICD-10-CM

## 2022-11-16 DIAGNOSIS — E1169 Type 2 diabetes mellitus with other specified complication: Secondary | ICD-10-CM | POA: Diagnosis not present

## 2022-11-16 DIAGNOSIS — E785 Hyperlipidemia, unspecified: Secondary | ICD-10-CM | POA: Diagnosis not present

## 2022-11-16 LAB — BAYER DCA HB A1C WAIVED: HB A1C (BAYER DCA - WAIVED): 7.2 % — ABNORMAL HIGH (ref 4.8–5.6)

## 2022-11-16 MED ORDER — ACCU-CHEK SOFTCLIX LANCETS MISC: 3 refills | Status: DC

## 2022-11-16 MED ORDER — DROPSAFE ALCOHOL PREP 70 % PADS: MEDICATED_PAD | 3 refills | Status: DC

## 2022-11-16 NOTE — Progress Notes (Signed)
BP 124/71   Pulse 64   Ht 5\' 9"  (1.753 m)   Wt 180 lb (81.6 kg)   SpO2 96%   BMI 26.58 kg/m    Subjective:   Patient ID: Philip Huang, male    DOB: 28-Nov-1950, 72 y.o.   MRN: [id number]  HPI: Philip Huang is a 72 y.o. male presenting on 11/16/2022 for Medical Management of Chronic Issues, Diabetes, Hypertension, and Hyperlipidemia   HPI Type 2 diabetes mellitus Patient comes in today for recheck of his diabetes. Patient has been currently taking Jardiance and glipizide and Ozempic, for about a month and a half now.. Patient is currently on an ACE inhibitor/ARB. Patient has not seen an ophthalmologist this year. Patient denies any new issues with their feet. The symptom started onset as an adult hypertension and hyperlipidemia ARE RELATED TO DM   Hypertension Patient is currently on lisinopril hydrochlorothiazide, and their blood pressure today is 124/71. Patient denies any lightheadedness or dizziness. Patient denies headaches, blurred vision, chest pains, shortness of breath, or weakness. Denies any side effects from medication and is content with current medication.   Hyperlipidemia Patient is coming in for recheck of his hyperlipidemia. The patient is currently taking fenofibrate and fish oils and Crestor. They deny any issues with myalgias or history of liver damage from it. They deny any focal numbness or weakness or chest pain.   Relevant past medical, surgical, family and social history reviewed and updated as indicated. Interim medical history since our last visit reviewed. Allergies and medications reviewed and updated.  Review of Systems  Constitutional:  Negative for chills and fever.  Eyes:  Negative for visual disturbance.  Respiratory:  Negative for shortness of breath and wheezing.   Cardiovascular:  Negative for chest pain and leg swelling.  Musculoskeletal:  Negative for back pain and gait problem.  Skin:  Negative for rash.  Neurological:  Negative  for dizziness, weakness and light-headedness.  All other systems reviewed and are negative.   Per HPI unless specifically indicated above   Allergies as of 11/16/2022       Reactions   Doxycycline    Sulfa Antibiotics Rash        Medication List        Accurate as of November 16, 2022  3:38 PM. If you have any questions, ask your nurse or doctor.          Accu-Chek Aviva Plus test strip Generic drug: glucose blood TEST BLOOD SUGAR TWICE DAILY   Accu-Chek Softclix Lancets lancets TEST BLOOD SUGAR TWICE DAILY Dx E11.9   aspirin 81 MG tablet Take 81 mg by mouth daily.   DropSafe Alcohol Prep 70 % Pads USE AS DIRECTED TWICE DAILY What changed:  when to take this additional instructions Changed by: Elige Radon Kalayla Shadden, MD   empagliflozin 25 MG Tabs tablet Commonly known as: Jardiance Take 1 tablet (25 mg total) by mouth daily before breakfast.   fenofibrate 160 MG tablet Take 1 tablet (160 mg total) by mouth daily.   fish oil-omega-3 fatty acids 1000 MG capsule Take 1 g by mouth daily.   glipiZIDE 10 MG 24 hr tablet Commonly known as: GLUCOTROL XL Take 1 tablet (10 mg total) by mouth daily with breakfast.   lisinopril-hydrochlorothiazide 20-12.5 MG tablet Commonly known as: ZESTORETIC Take 2 tablets by mouth daily.   MENS 50+ ADVANCED PO Take by mouth.   metFORMIN 1000 MG tablet Commonly known as: GLUCOPHAGE Take 1 tablet (1,000 mg total)  by mouth 2 (two) times daily with a meal.   nitroGLYCERIN 0.4 MG SL tablet Commonly known as: NITROSTAT Place 1 tablet (0.4 mg total) under the tongue every 5 (five) minutes as needed.   rosuvastatin 40 MG tablet Commonly known as: CRESTOR TAKE 1 TABLET EVERY DAY   Semaglutide (1 MG/DOSE) 4 MG/3ML Sopn Inject 1 mg as directed once a week.   True Metrix Air Glucose Meter w/Device Kit Test BS BID Dx E11.9   True Metrix Meter w/Device Kit USE AS DIRECTED   Vitamin D 50 MCG (2000 UT) tablet Take 2,000 Units by  mouth daily.         Objective:   BP 124/71   Pulse 64   Ht  (1.753 m)   Wt 180 lb (81.6 kg)   SpO2 96%   BMI 26.58 kg/m   Wt Readings from Last 3 Encounters:  11/16/22 180 lb (81.6 kg)  07/23/22 183 lb (83 kg)  04/20/22 182 lb (82.6 kg)    Physical Exam Vitals and nursing note reviewed.  Constitutional:      General: He is not in acute distress.    Appearance: He is well-developed. He is not diaphoretic.  Eyes:     General: No scleral icterus.    Conjunctiva/sclera: Conjunctivae normal.  Neck:     Thyroid: No thyromegaly.  Cardiovascular:     Rate and Rhythm: Normal rate and regular rhythm.     Heart sounds: Normal heart sounds. No murmur heard. Pulmonary:     Effort: Pulmonary effort is normal. No respiratory distress.     Breath sounds: Normal breath sounds. No wheezing.  Musculoskeletal:        General: No swelling. Normal range of motion.     Cervical back: Neck supple.  Lymphadenopathy:     Cervical: No cervical adenopathy.  Skin:    General: Skin is warm and dry.     Findings: No rash.  Neurological:     Mental Status: He is alert and oriented to person, place, and time.     Coordination: Coordination normal.  Psychiatric:        Behavior: Behavior normal.       Assessment & Plan:   Problem List Items Addressed This Visit       Cardiovascular and Mediastinum   Hypertension associated with diabetes     Endocrine   Type II diabetes mellitus - Primary (Chronic)   Relevant Orders   Bayer DCA Hb A1c Waived   Hyperlipidemia associated with type 2 diabetes mellitus    A1c is much improved at 7.2, continue current medicine, I think will be much better next time and focus on diet as well. Follow up plan: Return in about 3 months (around 02/15/2023), or if symptoms worsen or fail to improve, for Diabetes and hypertension and cholesterol recheck.  Counseling provided for all of the vaccine components Orders Placed This Encounter  Procedures    Bayer DCA Hb A1c Waived    Arville Care, MD [facility] 11/16/2022, 3:38 PM

## 2022-11-19 ENCOUNTER — Other Ambulatory Visit: Payer: Self-pay | Admitting: Family Medicine

## 2022-11-19 DIAGNOSIS — E1169 Type 2 diabetes mellitus with other specified complication: Secondary | ICD-10-CM

## 2022-12-17 ENCOUNTER — Other Ambulatory Visit: Payer: Self-pay | Admitting: Family Medicine

## 2023-01-02 ENCOUNTER — Telehealth: Payer: Self-pay

## 2023-01-02 NOTE — Telephone Encounter (Signed)
Received Ozempic for patient through patient assistance. Contacted his wife and notified her it was ready for pick up. Patients wife verbalized understanding. Meds placed in fridge

## 2023-01-21 ENCOUNTER — Ambulatory Visit (INDEPENDENT_AMBULATORY_CARE_PROVIDER_SITE_OTHER): Payer: Medicare HMO

## 2023-01-21 VITALS — Ht 69.0 in | Wt 170.0 lb

## 2023-01-21 DIAGNOSIS — Z0000 Encounter for general adult medical examination without abnormal findings: Secondary | ICD-10-CM | POA: Diagnosis not present

## 2023-01-21 NOTE — Progress Notes (Signed)
Subjective:   Philip Huang is a 72 y.o. male who presents for Medicare Annual/Subsequent preventive examination. I connected with  Philip Huang on 01/21/23 by a audio enabled telemedicine application and verified that I am speaking with the correct person using two identifiers.  Patient Location: Home  Provider Location: Home Office  I discussed the limitations of evaluation and management by telemedicine. The patient expressed understanding and agreed to proceed.  Review of Systems     Cardiac Risk Factors include: advanced age (>14men, >74 women);male gender;diabetes mellitus;dyslipidemia;hypertension     Objective:    Today's Vitals   01/21/23 0819  Weight: 170 lb (77.1 kg)  Height: 5\' 9"  (1.753 m)   Body mass index is 25.1 kg/m.     01/21/2023    8:23 AM 02/28/2022    2:48 PM 01/15/2022    8:22 AM 12/27/2020    8:22 AM 05/05/2019    1:37 PM 04/11/2018    1:06 PM  Advanced Directives  Does Patient Have a Medical Advance Directive? Yes Yes Yes Yes Yes Yes  Type of Estate agent of [place];Living will  Healthcare Power of [place];Living will Healthcare Power of [place];Living will Healthcare Power of [employer] Power of Attorney  Does patient want to make changes to medical advance directive?    No - Guardian declined No - Patient declined   Copy of Healthcare Power of Attorney in Chart? No - copy requested  No - copy requested No - copy requested No - copy requested No - copy requested  Would patient like information on creating a medical advance directive?    No - Patient declined      Current Medications (verified) Outpatient Encounter Medications as of 01/21/2023  Medication Sig   ACCU-CHEK AVIVA PLUS test strip TEST BLOOD SUGAR TWICE DAILY   Accu-Chek Softclix Lancets lancets TEST BLOOD SUGAR TWICE DAILY Dx E11.9   Alcohol Swabs (DROPSAFE ALCOHOL PREP) 70 % PADS USE AS DIRECTED TWICE DAILY   aspirin 81 MG tablet Take 81 mg by  mouth daily.   Blood Glucose Monitoring Suppl (TRUE METRIX AIR GLUCOSE METER) w/Device KIT Test BS BID Dx E11.9   Blood Glucose Monitoring Suppl (TRUE METRIX METER) w/Device KIT USE AS DIRECTED   Cholecalciferol (VITAMIN D) 2000 UNITS tablet Take 2,000 Units by mouth daily.   empagliflozin (JARDIANCE) 25 MG TABS tablet Take 1 tablet (25 mg total) by mouth daily before breakfast.   fenofibrate 160 MG tablet Take 1 tablet (160 mg total) by mouth daily.   fish oil-omega-3 fatty acids 1000 MG capsule Take 1 g by mouth daily.   glipiZIDE (GLUCOTROL XL) 10 MG 24 hr tablet Take 1 tablet (10 mg total) by mouth daily with breakfast.   lisinopril-hydrochlorothiazide (ZESTORETIC) 20-12.5 MG tablet Take 2 tablets by mouth daily.   metFORMIN (GLUCOPHAGE) 1000 MG tablet Take 1 tablet (1,000 mg total) by mouth 2 (two) times daily with a meal.   Multiple Vitamins-Minerals (MENS 50+ ADVANCED PO) Take by mouth.   nitroGLYCERIN (NITROSTAT) 0.4 MG SL tablet Place 1 tablet (0.4 mg total) under the tongue every 5 (five) minutes as needed.   rosuvastatin (CRESTOR) 40 MG tablet TAKE 1 TABLET EVERY DAY   Semaglutide, 1 MG/DOSE, 4 MG/3ML SOPN Inject 1 mg as directed once a week.   No facility-administered encounter medications on file as of 01/21/2023.    Allergies (verified) Doxycycline and Sulfa antibiotics   History: Past Medical History:  Diagnosis Date   Diabetes  mellitus    HTN (hypertension)    Hyperlipidemia    Overweight(278.02)    Squamous acanthoma of upper extremity    Past Surgical History:  Procedure Laterality Date   None     squamous acanthoma of upper extremity     Family History  Problem Relation Age of Onset   Diabetes Mother    Heart disease Mother 34       CABG   Hyperlipidemia Mother    Hypertension Mother    Parkinsonism Father    Diabetes Sister    Hyperlipidemia Sister    Hypertension Sister    Diabetes Brother    Hyperlipidemia Brother    Hypertension Brother     Social History   Socioeconomic History   Marital status: Married    Spouse name: Hope   Number of children: 2   Years of education: 12   Highest education level: 12th grade  Occupational History   Occupation: Retired    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  Tobacco Use   Smoking status: Former    Types: Cigarettes    Quit date: 08/06/1962    Years since quitting: 60.5   Smokeless tobacco: Never  Vaping Use   Vaping Use: Never used  Substance and Sexual Activity   Alcohol use: No   Drug use: No   Sexual activity: Yes  Other Topics Concern   Not on file  Social History Narrative   Not on file   Social Determinants of Health   Financial Resource Strain: Low Risk  (01/21/2023)   Overall Financial Resource Strain (CARDIA)    Difficulty of Paying Living Expenses: Not hard at all  Food Insecurity: No Food Insecurity (01/21/2023)   Hunger Vital Sign    Worried About Running Out of Food in the Last Year: Never true    Ran Out of Food in the Last Year: Never true  Transportation Needs: No Transportation Needs (11/16/2022)   PRAPARE - Administrator, Civil Service (Medical): No    Lack of Transportation (Non-Medical): No  Physical Activity: Sufficiently Active (01/21/2023)   Exercise Vital Sign    Days of Exercise per Week: 5 days    Minutes of Exercise per Session: 30 min  Recent Concern: Physical Activity - Insufficiently Active (11/16/2022)   Exercise Vital Sign    Days of Exercise per Week: 3 days    Minutes of Exercise per Session: 20 min  Stress: No Stress Concern Present (01/21/2023)   [employer] of Occupational Health - Occupational Stress Questionnaire    Feeling of Stress : Not at all  Social Connections: Moderately Integrated (01/21/2023)   Social Connection and Isolation Panel [NHANES]    Frequency of Communication with Friends and Family: More than three times a week    Frequency of Social Gatherings with Friends and Family: More  than three times a week    Attends Religious Services: 1 to 4 times per year    Active Member of [employer] or Organizations: No    Attends Engineer, structural: Never    Marital Status: Married    Tobacco Counseling Counseling given: Not Answered   Clinical Intake:  Pre-visit preparation completed: Yes  Pain : No/denies pain     Nutritional Risks: None Diabetes: Yes CBG done?: No Did pt. bring in CBG monitor from home?: No  How often do you need to have someone help you when you read instructions, pamphlets, or other written  materials from your doctor or pharmacy?: 1 - Never  Diabetic?yes  Nutrition Risk Assessment:  Has the patient had any N/V/D within the last 2 months?  No  Does the patient have any non-healing wounds?  No  Has the patient had any unintentional weight loss or weight gain?  No   Diabetes:  Is the patient diabetic?  Yes  If diabetic, was a CBG obtained today?  No  Did the patient bring in their glucometer from home?  No  How often do you monitor your CBG's? Daily .   Financial Strains and Diabetes Management:  Are you having any financial strains with the device, your supplies or your medication? No .  Does the patient want to be seen by Chronic Care Management for management of their diabetes?  No  Would the patient like to be referred to a Nutritionit or for Diabetic Management?  No   Diabetic Exams:  Diabetic Eye Exam: Completed 01/2023 Diabetic Foot Exam: Overdue, Pt has been advised about the importance in completing this exam. Pt is scheduled for diabetic foot exam on next office exam .   Interpreter Needed?: No  Information entered by :: Renie Ora, LPN   Activities of Daily Living    01/21/2023    8:23 AM  In your present state of health, do you have any difficulty performing the following activities:  Hearing? 0  Vision? 0  Difficulty concentrating or making decisions? 0  Walking or climbing stairs? 0  Dressing or  bathing? 0  Doing errands, shopping? 0  Preparing Food and eating ? N  Using the Toilet? N  In the past six months, have you accidently leaked urine? N  Do you have problems with loss of bowel control? N  Managing your Medications? N  Managing your Finances? N  Housekeeping or managing your Housekeeping? N    Patient Care Team: [name], Elige Radon, MD as PCP - General (Family Medicine) Danella Maiers, [facility] (Pharmacist)  Indicate any recent Medical Services you may have received from other than [facility] providers in the past year (date may be approximate).     Assessment:   This is a routine wellness examination for Jeremiah.  Hearing/Vision screen Vision Screening - Comments:: Wears rx glasses - up to date with routine eye exams with  Dr.harris   Dietary issues and exercise activities discussed: Current Exercise Habits: Home exercise routine, Type of exercise: walking, Time (Minutes): 30, Frequency (Times/Week): 5, Weekly Exercise (Minutes/Week): 150, Intensity: Mild, Exercise limited by: None identified   Goals Addressed             This Visit's Progress    DIET - INCREASE WATER INTAKE   On track    Try to drink 6-8 glasses of water daily.       Depression Screen    01/21/2023    8:22 AM 11/16/2022    3:19 PM 07/23/2022   10:44 AM 04/20/2022    3:02 PM 02/22/2022    9:20 AM 01/31/2022    9:23 AM 01/15/2022    8:19 AM  PHQ 2/9 Scores  PHQ - 2 Score 0 0 0 0 0 0 0  PHQ- 9 Score  0 0 0 0      Fall Risk    01/21/2023    8:20 AM 11/16/2022    3:19 PM 07/23/2022   10:44 AM 04/20/2022    3:02 PM 02/22/2022    9:19 AM  Fall Risk   Falls in the past  year? 0 0 0 0 0  Number falls in past yr: 0      Injury with Fall? 0      Risk for fall due to : No Fall Risks      Follow up Falls prevention discussed        FALL RISK PREVENTION PERTAINING TO THE HOME:  Any stairs in or around the home? No  If so, are there any without handrails? No  Home free of loose throw rugs in  walkways, pet beds, electrical cords, etc? Yes  Adequate lighting in your home to reduce risk of falls? Yes   ASSISTIVE DEVICES UTILIZED TO PREVENT FALLS:  Life alert? No  Use of a cane, walker or w/c? No  Grab bars in the bathroom? No  Shower chair or bench in shower? No  Elevated toilet seat or a handicapped toilet? No       04/11/2018    1:16 PM 04/11/2018    1:09 PM  MMSE - Mini Mental State Exam  Orientation to time 4 4  Orientation to Place 5 3  Registration 3 3  Attention/ Calculation 4 4  Recall 2 2  Language- name 2 objects 2 2  Language- repeat 1 1  Language- follow 3 step command 3 3  Language- read & follow direction 1 1  Write a sentence 1 1  Copy design 1 1  Total score 27 25        01/21/2023    8:23 AM 01/15/2022    8:23 AM 12/27/2020    8:25 AM 05/05/2019    1:41 PM  6CIT Screen  What Year? 0 points 0 points 0 points 0 points  What month? 0 points 0 points 0 points 0 points  What time? 0 points 0 points 0 points 0 points  Count back from 20 0 points 0 points 0 points 0 points  Months in reverse 0 points 4 points 0 points 4 points  Repeat phrase 0 points 0 points 0 points 0 points  Total Score 0 points 4 points 0 points 4 points    Immunizations Immunization History  Administered Date(s) Administered   Fluad Quad(high Dose 65+) 04/29/2019, 05/20/2020, 06/15/2021, 04/20/2022   Influenza, High Dose Seasonal PF 04/27/2018   Influenza,inj,Quad PF,6+ Mos 04/30/2015, 04/20/2017   Influenza-Unspecified 05/20/2016   PFIZER(Purple Top)SARS-COV-2 Vaccination 10/07/2019, 10/28/2019   Pneumococcal Conjugate-13 08/26/2017, 08/26/2017   Pneumococcal Polysaccharide-23 05/06/2001, 05/06/2001, 05/20/2020   Td 08/06/2005, 06/29/2013   Tdap 08/21/2019   Zoster Recombinat (Shingrix) 09/21/2021, 12/28/2021   Zoster, Live 10/28/2013    TDAP status: Up to date  Flu Vaccine status: Up to date  Pneumococcal vaccine status: Up to date  Covid-19 vaccine status:  Completed vaccines  Qualifies for Shingles Vaccine? Yes   Zostavax completed Yes   Shingrix Completed?: Yes  Screening Tests Health Maintenance  Topic Date Due   COVID-19 Vaccine (3 - Pfizer risk series) 11/25/2019   OPHTHALMOLOGY EXAM  09/20/2022   FOOT EXAM  12/29/2022   HEMOGLOBIN A1C  02/15/2023   INFLUENZA VACCINE  03/07/2023   Diabetic kidney evaluation - eGFR measurement  07/19/2023   Diabetic kidney evaluation - Urine ACR  07/24/2023   Medicare Annual Wellness (AWV)  01/21/2024   Colonoscopy  05/17/2025   DTaP/Tdap/Td (4 - Td or Tdap) 08/20/2029   Pneumonia Vaccine 53+ Years old  Completed   Hepatitis C Screening  Completed   Zoster Vaccines- Shingrix  Completed   HPV VACCINES  Aged Out  COLON CANCER SCREENING ANNUAL FOBT  Discontinued    Health Maintenance  Health Maintenance Due  Topic Date Due   COVID-19 Vaccine (3 - Pfizer risk series) 11/25/2019   OPHTHALMOLOGY EXAM  09/20/2022   FOOT EXAM  12/29/2022    Colorectal cancer screening: Type of screening: Colonoscopy. Completed 05/18/2015. Repeat every 10 years  Lung Cancer Screening: (Low Dose CT Chest recommended if Age 65-80 years, 30 pack-year currently smoking OR have quit w/in 15years.) does not qualify.   Lung Cancer Screening Referral: n/a  Additional Screening:  Hepatitis C Screening: does not qualify; Completed 07/19/2015  Vision Screening: Recommended annual ophthalmology exams for early detection of glaucoma and other disorders of the eye. Is the patient up to date with their annual eye exam?  Yes  Who is the provider or what is the name of the office in which the patient attends annual eye exams? Dr.Harris  If pt is not established with a provider, would they like to be referred to a provider to establish care? No .   Dental Screening: Recommended annual dental exams for proper oral hygiene  Community Resource Referral / Chronic Care Management: CRR required this visit?  No   CCM required  this visit?  No      Plan:     I have personally reviewed and noted the following in the patient's chart:   Medical and social history Use of alcohol, tobacco or illicit drugs  Current medications and supplements including opioid prescriptions. Patient is not currently taking opioid prescriptions. Functional ability and status Nutritional status Physical activity Advanced directives List of other physicians Hospitalizations, surgeries, and ER visits in previous 12 months Vitals Screenings to include cognitive, depression, and falls Referrals and appointments  In addition, I have reviewed and discussed with patient certain preventive protocols, quality metrics, and best practice recommendations. A written personalized care plan for preventive services as well as general preventive health recommendations were provided to patient.     Lorrene Reid, LPN   [id number]/61/0960   Nurse Notes: none

## 2023-01-21 NOTE — Patient Instructions (Signed)
Philip Huang , Thank you for taking time to come for your Medicare Wellness Visit. I appreciate your ongoing commitment to your health goals. Please review the following plan we discussed and let me know if I can assist you in the future.   These are the goals we discussed:  Goals       DIET - INCREASE WATER INTAKE      Try to drink 6-8 glasses of water daily.      Exercise 150 min/wk Moderate Activity      01/15/22-Continue to stay active and healthy.      T2DM-PHARMD GOAL (pt-stated)      Current Barriers:  Unable to independently afford treatment regimen Unable to achieve control of T2DM   Pharmacist Clinical Goal(s):  Over the next 180 days, patient will verbalize ability to afford treatment regimen achieve control of T2DM as evidenced by IMPROVED GLYCEMIC CONTROL/GOAL A1C through collaboration with PharmD and provider.   Interventions: 1:1 collaboration with [name], Elige Radon, MD regarding development and update of comprehensive plan of care as evidenced by provider attestation and co-signature Inter-disciplinary care team collaboration (see longitudinal plan of care) Comprehensive medication review performed; medication list updated in electronic medical record  Diabetes: Uncontrolled-A1C 7.0%, GFR 65;  Current treatment: Metformin 1g BID Rybelsus 14mg  daily Via [facility] patient assistance program (last shipment arrived 05/25/21 to PCP office) [place] Pepper 25mg  daily Via [employer] patient assistance program Glipizide 10mg  XL daily  Of note, patient does not wish to go on injectable--we have discussed adding/switching to if A1c still not controlled at f/u PCP appt Current glucose readings: fasting glucose: <160, post prandial glucose: <200 Denies hypoglycemic/hyperglycemic symptoms Current meal patterns: doesn't have a large appetite on Rybelsus 14mg  daily Dietary materials provided for low carb sweet options plus meal planning Discussed meal planning options  and Plate method for healthy eating Avoid sugary drinks and desserts Incorporate balanced protein, non starchy veggies, 1 serving of carbohydrate with each meal Increase water intake Increase physical activity as able Current exercise: works/active outdoors Educated on diabetes (making additional lifestyle changes) and taking medications as prescribed Collaborated with PCP--will not change therapy at this time; recheck A1c; patient does not wish to have an injectable medications; continue all medications as prescribed Assessed patient finances. Patient enrolled in [employer] Patient Assistance for Rybelsus and [employer] Cares Patient Assistance for [employer]   Patient Goals/Self-Care Activities Over the next 180 days, patient will:  - take medications as prescribed check glucose 3X WEEKLY OR IF SYMPTOMATIC, document, and provide at future appointments collaborate with provider on medication access solutions  Follow Up Plan: Telephone follow up appointment with care management team member scheduled for: 1 MONTH         This is a list of the screening recommended for you and due dates:  Health Maintenance  Topic Date Due   COVID-19 Vaccine (3 - Pfizer risk series) 11/25/2019   Eye exam for diabetics  09/20/2022   Complete foot exam   12/29/2022   Hemoglobin A1C  02/15/2023   Flu Shot  03/07/2023   Yearly kidney function blood test for diabetes  07/19/2023   Yearly kidney health urinalysis for diabetes  07/24/2023   Medicare Annual Wellness Visit  01/21/2024   Colon Cancer Screening  05/17/2025   DTaP/Tdap/Td vaccine (4 - Td or Tdap) 08/20/2029   Pneumonia Vaccine  Completed   Hepatitis C Screening  Completed   Zoster (Shingles) Vaccine  Completed   HPV Vaccine  Aged  Out   Stool Blood Test  Discontinued    Advanced directives: Please bring a copy of your health care power of attorney and living will to the office to be added to your chart at your convenience.    Conditions/risks identified: Aim for 30 minutes of exercise or brisk walking, 6-8 glasses of water, and 5 servings of fruits and vegetables each day.   Next appointment: Follow up in one year for your annual wellness visit.   Preventive Care 66 Years and Older, Male  Preventive care refers to lifestyle choices and visits with your health care provider that can promote health and wellness. What does preventive care include? A yearly physical exam. This is also called an annual well check. Dental exams once or twice a year. Routine eye exams. Ask your health care provider how often you should have your eyes checked. Personal lifestyle choices, including: Daily care of your teeth and gums. Regular physical activity. Eating a healthy diet. Avoiding tobacco and drug use. Limiting alcohol use. Practicing safe sex. Taking low doses of aspirin every day. Taking vitamin and mineral supplements as recommended by your health care provider. What happens during an annual well check? The services and screenings done by your health care provider during your annual well check will depend on your age, overall health, lifestyle risk factors, and family history of disease. Counseling  Your health care provider may ask you questions about your: Alcohol use. Tobacco use. Drug use. Emotional well-being. Home and relationship well-being. Sexual activity. Eating habits. History of falls. Memory and ability to understand (cognition). Work and work Astronomer. Screening  You may have the following tests or measurements: Height, weight, and BMI. Blood pressure. Lipid and cholesterol levels. These may be checked every 5 years, or more frequently if you are over 60 years old. Skin check. Lung cancer screening. You may have this screening every year starting at age 57 if you have a 30-pack-year history of smoking and currently smoke or have quit within the past 15 years. Fecal occult blood test  (FOBT) of the stool. You may have this test every year starting at age 72. Flexible sigmoidoscopy or colonoscopy. You may have a sigmoidoscopy every 5 years or a colonoscopy every 10 years starting at age 83. Prostate cancer screening. Recommendations will vary depending on your family history and other risks. Hepatitis C blood test. Hepatitis B blood test. Sexually transmitted disease (STD) testing. Diabetes screening. This is done by checking your blood sugar (glucose) after you have not eaten for a while (fasting). You may have this done every 1-3 years. Abdominal aortic aneurysm (AAA) screening. You may need this if you are a current or former smoker. Osteoporosis. You may be screened starting at age 38 if you are at high risk. Talk with your health care provider about your test results, treatment options, and if necessary, the need for more tests. Vaccines  Your health care provider may recommend certain vaccines, such as: Influenza vaccine. This is recommended every year. Tetanus, diphtheria, and acellular pertussis (Tdap, Td) vaccine. You may need a Td booster every 10 years. Zoster vaccine. You may need this after age 72. Pneumococcal 13-valent conjugate (PCV13) vaccine. One dose is recommended after age 5. Pneumococcal polysaccharide (PPSV23) vaccine. One dose is recommended after age 40. Talk to your health care provider about which screenings and vaccines you need and how often you need them. This information is not intended to replace advice given to you by your health care provider. Make  sure you discuss any questions you have with your health care provider. Document Released: 08/19/2015 Document Revised: 04/11/2016 Document Reviewed: 05/24/2015 Elsevier Interactive Patient Education  2017 [employer].  Fall Prevention in the Home Falls can cause injuries. They can happen to people of all ages. There are many things you can do to make your home safe and to help prevent  falls. What can I do on the outside of my home? Regularly fix the edges of walkways and driveways and fix any cracks. Remove anything that might make you trip as you walk through a door, such as a raised step or threshold. Trim any bushes or trees on the path to your home. Use bright outdoor lighting. Clear any walking paths of anything that might make someone trip, such as rocks or tools. Regularly check to see if handrails are loose or broken. Make sure that both sides of any steps have handrails. Any raised decks and porches should have guardrails on the edges. Have any leaves, snow, or ice cleared regularly. Use sand or salt on walking paths during winter. Clean up any spills in your garage right away. This includes oil or grease spills. What can I do in the bathroom? Use night lights. Install grab bars by the toilet and in the tub and shower. Do not use towel bars as grab bars. Use non-skid mats or decals in the tub or shower. If you need to sit down in the shower, use a plastic, non-slip stool. Keep the floor dry. Clean up any water that spills on the floor as soon as it happens. Remove soap buildup in the tub or shower regularly. Attach bath mats securely with double-sided non-slip rug tape. Do not have throw rugs and other things on the floor that can make you trip. What can I do in the bedroom? Use night lights. Make sure that you have a light by your bed that is easy to reach. Do not use any sheets or blankets that are too big for your bed. They should not hang down onto the floor. Have a firm chair that has side arms. You can use this for support while you get dressed. Do not have throw rugs and other things on the floor that can make you trip. What can I do in the kitchen? Clean up any spills right away. Avoid walking on wet floors. Keep items that you use a lot in easy-to-reach places. If you need to reach something above you, use a strong step stool that has a grab  bar. Keep electrical cords out of the way. Do not use floor polish or wax that makes floors slippery. If you must use wax, use non-skid floor wax. Do not have throw rugs and other things on the floor that can make you trip. What can I do with my stairs? Do not leave any items on the stairs. Make sure that there are handrails on both sides of the stairs and use them. Fix handrails that are broken or loose. Make sure that handrails are as long as the stairways. Check any carpeting to make sure that it is firmly attached to the stairs. Fix any carpet that is loose or worn. Avoid having throw rugs at the top or bottom of the stairs. If you do have throw rugs, attach them to the floor with carpet tape. Make sure that you have a light switch at the top of the stairs and the bottom of the stairs. If you do not have them, ask  someone to add them for you. What else can I do to help prevent falls? Wear shoes that: Do not have high heels. Have rubber bottoms. Are comfortable and fit you well. Are closed at the toe. Do not wear sandals. If you use a stepladder: Make sure that it is fully opened. Do not climb a closed stepladder. Make sure that both sides of the stepladder are locked into place. Ask someone to hold it for you, if possible. Clearly mark and make sure that you can see: Any grab bars or handrails. First and last steps. Where the edge of each step is. Use tools that help you move around (mobility aids) if they are needed. These include: Canes. Walkers. Scooters. Crutches. Turn on the lights when you go into a dark area. Replace any light bulbs as soon as they burn out. Set up your furniture so you have a clear path. Avoid moving your furniture around. If any of your floors are uneven, fix them. If there are any pets around you, be aware of where they are. Review your medicines with your doctor. Some medicines can make you feel dizzy. This can increase your chance of falling. Ask  your doctor what other things that you can do to help prevent falls. This information is not intended to replace advice given to you by your health care provider. Make sure you discuss any questions you have with your health care provider. Document Released: 05/19/2009 Document Revised: 12/29/2015 Document Reviewed: 08/27/2014 Elsevier Interactive Patient Education  2017 [employer].

## 2023-01-23 DIAGNOSIS — Z85820 Personal history of malignant melanoma of skin: Secondary | ICD-10-CM | POA: Diagnosis not present

## 2023-01-23 DIAGNOSIS — L579 Skin changes due to chronic exposure to nonionizing radiation, unspecified: Secondary | ICD-10-CM | POA: Diagnosis not present

## 2023-01-23 DIAGNOSIS — Z85828 Personal history of other malignant neoplasm of skin: Secondary | ICD-10-CM | POA: Diagnosis not present

## 2023-01-23 DIAGNOSIS — B354 Tinea corporis: Secondary | ICD-10-CM | POA: Diagnosis not present

## 2023-01-23 DIAGNOSIS — D235 Other benign neoplasm of skin of trunk: Secondary | ICD-10-CM | POA: Diagnosis not present

## 2023-01-23 DIAGNOSIS — L570 Actinic keratosis: Secondary | ICD-10-CM | POA: Diagnosis not present

## 2023-01-23 DIAGNOSIS — L821 Other seborrheic keratosis: Secondary | ICD-10-CM | POA: Diagnosis not present

## 2023-01-23 DIAGNOSIS — L814 Other melanin hyperpigmentation: Secondary | ICD-10-CM | POA: Diagnosis not present

## 2023-01-24 DIAGNOSIS — Z135 Encounter for screening for eye and ear disorders: Secondary | ICD-10-CM | POA: Diagnosis not present

## 2023-01-24 DIAGNOSIS — Z961 Presence of intraocular lens: Secondary | ICD-10-CM | POA: Diagnosis not present

## 2023-01-24 DIAGNOSIS — E113293 Type 2 diabetes mellitus with mild nonproliferative diabetic retinopathy without macular edema, bilateral: Secondary | ICD-10-CM | POA: Diagnosis not present

## 2023-01-24 DIAGNOSIS — H52223 Regular astigmatism, bilateral: Secondary | ICD-10-CM | POA: Diagnosis not present

## 2023-01-24 LAB — HM DIABETES EYE EXAM

## 2023-02-18 ENCOUNTER — Ambulatory Visit: Payer: Medicare HMO | Admitting: Family Medicine

## 2023-02-21 ENCOUNTER — Ambulatory Visit: Payer: Medicare HMO | Admitting: Family Medicine

## 2023-02-28 ENCOUNTER — Other Ambulatory Visit: Payer: Self-pay

## 2023-02-28 DIAGNOSIS — E1159 Type 2 diabetes mellitus with other circulatory complications: Secondary | ICD-10-CM

## 2023-02-28 DIAGNOSIS — E1169 Type 2 diabetes mellitus with other specified complication: Secondary | ICD-10-CM

## 2023-03-01 ENCOUNTER — Telehealth: Payer: Self-pay

## 2023-03-01 NOTE — Telephone Encounter (Signed)
Reached out to [facility] regarding Rybelsus refills.  Informed that patient now on ozempic and no longer needs rybelsus refills. Rep informed me that Rybelsus was not on autofill & she's unsure why it shipped. This was his last refill and if we'd like to return med we can call [facility] back for a return label :)

## 2023-03-08 ENCOUNTER — Ambulatory Visit: Payer: Medicare HMO | Admitting: Family Medicine

## 2023-03-25 ENCOUNTER — Other Ambulatory Visit: Payer: Medicare HMO

## 2023-03-25 DIAGNOSIS — E1169 Type 2 diabetes mellitus with other specified complication: Secondary | ICD-10-CM | POA: Diagnosis not present

## 2023-03-25 DIAGNOSIS — E785 Hyperlipidemia, unspecified: Secondary | ICD-10-CM | POA: Diagnosis not present

## 2023-03-25 DIAGNOSIS — E1159 Type 2 diabetes mellitus with other circulatory complications: Secondary | ICD-10-CM

## 2023-03-25 DIAGNOSIS — I152 Hypertension secondary to endocrine disorders: Secondary | ICD-10-CM | POA: Diagnosis not present

## 2023-03-25 LAB — BAYER DCA HB A1C WAIVED: HB A1C (BAYER DCA - WAIVED): 7.1 % — ABNORMAL HIGH (ref 4.8–5.6)

## 2023-03-26 LAB — CMP14+EGFR
ALT: 25 IU/L (ref 0–44)
AST: 24 IU/L (ref 0–40)
Albumin: 4.6 g/dL (ref 3.9–4.9)
Alkaline Phosphatase: 47 IU/L (ref 44–121)
BUN/Creatinine Ratio: 17 (ref 10–24)
BUN: 20 mg/dL (ref 8–27)
Bilirubin Total: 0.3 mg/dL (ref 0.0–1.2)
CO2: 26 mmol/L (ref 20–29)
Calcium: 10.2 mg/dL (ref 8.6–10.2)
Chloride: 100 mmol/L (ref 96–106)
Creatinine, Ser: 1.21 mg/dL (ref 0.76–1.27)
Globulin, Total: 2.5 g/dL (ref 1.5–4.5)
Glucose: 107 mg/dL — ABNORMAL HIGH (ref 70–99)
Potassium: 4.4 mmol/L (ref 3.5–5.2)
Sodium: 141 mmol/L (ref 134–144)
Total Protein: 7.1 g/dL (ref 6.0–8.5)
eGFR: 64 mL/min/{1.73_m2} (ref >59)

## 2023-03-26 LAB — CBC WITH DIFFERENTIAL/PLATELET
Basophils Absolute: 0.1 10*3/uL (ref 0.0–0.2)
Basos: 1 %
EOS (ABSOLUTE): 0.5 10*3/uL — ABNORMAL HIGH (ref 0.0–0.4)
Eos: 6 %
Hematocrit: 43.3 % (ref 37.5–51.0)
Hemoglobin: 14.2 g/dL (ref 13.0–17.7)
Immature Grans (Abs): 0 10*3/uL (ref 0.0–0.1)
Immature Granulocytes: 0 %
Lymphocytes Absolute: 2.1 10*3/uL (ref 0.7–3.1)
Lymphs: 26 %
MCH: 28.5 pg (ref 26.6–33.0)
MCHC: 32.8 g/dL (ref 31.5–35.7)
MCV: 87 fL (ref 79–97)
Monocytes Absolute: 0.6 10*3/uL (ref 0.1–0.9)
Monocytes: 8 %
Neutrophils Absolute: 4.8 10*3/uL (ref 1.4–7.0)
Neutrophils: 59 %
Platelets: 260 10*3/uL (ref 150–450)
RBC: 4.98 x10E6/uL (ref 4.14–5.80)
RDW: 13.6 % (ref 11.6–15.4)
WBC: 8 10*3/uL (ref 3.4–10.8)

## 2023-03-26 LAB — LIPID PANEL
Chol/HDL Ratio: 3.4 ratio (ref 0.0–5.0)
Cholesterol, Total: 148 mg/dL (ref 100–199)
HDL: 43 mg/dL (ref >39)
LDL Chol Calc (NIH): 83 mg/dL (ref 0–99)
Triglycerides: 123 mg/dL (ref 0–149)
VLDL Cholesterol Cal: 22 mg/dL (ref 5–40)

## 2023-04-01 ENCOUNTER — Ambulatory Visit (INDEPENDENT_AMBULATORY_CARE_PROVIDER_SITE_OTHER): Payer: Medicare HMO | Admitting: Family Medicine

## 2023-04-01 VITALS — BP 131/60 | HR 75 | Ht 69.0 in | Wt 180.0 lb

## 2023-04-01 DIAGNOSIS — E1169 Type 2 diabetes mellitus with other specified complication: Secondary | ICD-10-CM | POA: Diagnosis not present

## 2023-04-01 DIAGNOSIS — Z7984 Long term (current) use of oral hypoglycemic drugs: Secondary | ICD-10-CM | POA: Diagnosis not present

## 2023-04-01 DIAGNOSIS — E785 Hyperlipidemia, unspecified: Secondary | ICD-10-CM | POA: Diagnosis not present

## 2023-04-01 DIAGNOSIS — E1159 Type 2 diabetes mellitus with other circulatory complications: Secondary | ICD-10-CM

## 2023-04-01 DIAGNOSIS — I152 Hypertension secondary to endocrine disorders: Secondary | ICD-10-CM

## 2023-04-01 MED ORDER — GLIPIZIDE ER 10 MG PO TB24
10.0000 mg | ORAL_TABLET | Freq: Every day | ORAL | 3 refills | Status: DC
Start: 2023-04-01 — End: 2024-01-16

## 2023-04-01 MED ORDER — LISINOPRIL-HYDROCHLOROTHIAZIDE 20-12.5 MG PO TABS
2.0000 | ORAL_TABLET | Freq: Every day | ORAL | 3 refills | Status: DC
Start: 2023-04-01 — End: 2024-01-16

## 2023-04-01 MED ORDER — FENOFIBRATE 160 MG PO TABS
160.0000 mg | ORAL_TABLET | Freq: Every day | ORAL | 3 refills | Status: DC
Start: 2023-04-01 — End: 2024-01-16

## 2023-04-01 MED ORDER — METFORMIN HCL 1000 MG PO TABS
1000.0000 mg | ORAL_TABLET | Freq: Two times a day (BID) | ORAL | 3 refills | Status: DC
Start: 2023-04-01 — End: 2024-01-16

## 2023-04-01 MED ORDER — ROSUVASTATIN CALCIUM 40 MG PO TABS
40.0000 mg | ORAL_TABLET | Freq: Every day | ORAL | 3 refills | Status: DC
Start: 2023-04-01 — End: 2024-01-16

## 2023-04-01 NOTE — Progress Notes (Signed)
BP 131/60   Pulse 75   Ht 5\' 9"  (1.753 m)   Wt 180 lb (81.6 kg)   SpO2 95%   BMI 26.58 kg/m    Subjective:   Patient ID: Philip Huang, male    DOB: 06-08-1951, 72 y.o.   MRN: [id number]  HPI: Philip Huang is a 72 y.o. male presenting on 04/01/2023 for Medical Management of Chronic Issues and Diabetes   HPI Type 2 diabetes mellitus Patient comes in today for recheck of his diabetes. Patient has been currently taking glipizide and metformin and Ozempic and Jardiance. Patient is currently on an ACE inhibitor/ARB. Patient has not seen an ophthalmologist this year. Patient denies any new issues with their feet. The symptom started onset as an adult hypertension and hyperlipidemia ARE RELATED TO DM   Hypertension Patient is currently on lisinopril hydrochlorothiazide, and their blood pressure today is 131/60. Patient denies any lightheadedness or dizziness. Patient denies headaches, blurred vision, chest pains, shortness of breath, or weakness. Denies any side effects from medication and is content with current medication.   Hyperlipidemia Patient is coming in for recheck of his hyperlipidemia. The patient is currently taking fenofibrate and Crestor and fish oils. They deny any issues with myalgias or history of liver damage from it. They deny any focal numbness or weakness or chest pain.   Relevant past medical, surgical, family and social history reviewed and updated as indicated. Interim medical history since our last visit reviewed. Allergies and medications reviewed and updated.  Review of Systems  Constitutional:  Negative for chills and fever.  Eyes:  Negative for visual disturbance.  Respiratory:  Negative for shortness of breath and wheezing.   Cardiovascular:  Negative for chest pain and leg swelling.  Musculoskeletal:  Negative for back pain and gait problem.  Skin:  Negative for rash.  Neurological:  Negative for dizziness and light-headedness.  All other  systems reviewed and are negative.   Per HPI unless specifically indicated above   Allergies as of 04/01/2023       Reactions   Doxycycline    Sulfa Antibiotics Rash        Medication List        Accurate as of April 01, 2023  8:44 AM. If you have any questions, ask your nurse or doctor.          STOP taking these medications    True Metrix Air Glucose Meter w/Device Kit Stopped by: Elige Radon Afrah Burlison   True Metrix Meter w/Device Kit Stopped by: Elige Radon Claudie Rathbone       TAKE these medications    Accu-Chek Aviva Plus test strip Generic drug: glucose blood TEST BLOOD SUGAR TWICE DAILY   Accu-Chek Softclix Lancets lancets TEST BLOOD SUGAR TWICE DAILY Dx E11.9   aspirin 81 MG tablet Take 81 mg by mouth daily.   DropSafe Alcohol Prep 70 % Pads USE AS DIRECTED TWICE DAILY   empagliflozin 25 MG Tabs tablet Commonly known as: Jardiance Take 1 tablet (25 mg total) by mouth daily before breakfast.   fenofibrate 160 MG tablet Take 1 tablet (160 mg total) by mouth daily.   fish oil-omega-3 fatty acids 1000 MG capsule Take 1 g by mouth daily.   glipiZIDE 10 MG 24 hr tablet Commonly known as: GLUCOTROL XL Take 1 tablet (10 mg total) by mouth daily with breakfast.   lisinopril-hydrochlorothiazide 20-12.5 MG tablet Commonly known as: ZESTORETIC Take 2 tablets by mouth daily.   MENS 50+ ADVANCED PO  Take by mouth.   metFORMIN 1000 MG tablet Commonly known as: GLUCOPHAGE Take 1 tablet (1,000 mg total) by mouth 2 (two) times daily with a meal.   nitroGLYCERIN 0.4 MG SL tablet Commonly known as: NITROSTAT Place 1 tablet (0.4 mg total) under the tongue every 5 (five) minutes as needed.   rosuvastatin 40 MG tablet Commonly known as: CRESTOR Take 1 tablet (40 mg total) by mouth daily.   Semaglutide (1 MG/DOSE) 4 MG/3ML Sopn Inject 1 mg as directed once a week.   Vitamin D 50 MCG (2000 UT) tablet Take 2,000 Units by mouth daily.         Objective:    BP 131/60   Pulse 75   Ht 5\' 9"  (1.753 m)   Wt 180 lb (81.6 kg)   SpO2 95%   BMI 26.58 kg/m   Wt Readings from Last 3 Encounters:  04/01/23 180 lb (81.6 kg)  01/21/23 170 lb (77.1 kg)  11/16/22 180 lb (81.6 kg)    Physical Exam Vitals and nursing note reviewed.  Constitutional:      General: He is not in acute distress.    Appearance: He is well-developed. He is not diaphoretic.  Eyes:     General: No scleral icterus.    Conjunctiva/sclera: Conjunctivae normal.  Neck:     Thyroid: No thyromegaly.  Cardiovascular:     Rate and Rhythm: Normal rate and regular rhythm.     Heart sounds: Normal heart sounds. No murmur heard. Pulmonary:     Effort: Pulmonary effort is normal. No respiratory distress.     Breath sounds: Normal breath sounds. No wheezing.  Musculoskeletal:        General: No swelling. Normal range of motion.     Cervical back: Neck supple.  Lymphadenopathy:     Cervical: No cervical adenopathy.  Skin:    General: Skin is warm and dry.     Findings: No rash.  Neurological:     Mental Status: He is alert and oriented to person, place, and time.     Coordination: Coordination normal.  Psychiatric:        Behavior: Behavior normal.     Results for orders placed or performed in visit on 03/25/23  Bayer DCA Hb A1c Waived  Result Value Ref Range   HB A1C (BAYER DCA - WAIVED) 7.1 (H) 4.8 - 5.6 %  CMP14+EGFR  Result Value Ref Range   Glucose 107 (H) 70 - 99 mg/dL   BUN 20 8 - 27 mg/dL   Creatinine, Ser 4.01 0.76 - 1.27 mg/dL   eGFR 64 >02 VO/ZDG/6.44   BUN/Creatinine Ratio 17 10 - 24   Sodium 141 134 - 144 mmol/L   Potassium 4.4 3.5 - 5.2 mmol/L   Chloride 100 96 - 106 mmol/L   CO2 26 20 - 29 mmol/L   Calcium 10.2 8.6 - 10.2 mg/dL   Total Protein 7.1 6.0 - 8.5 g/dL   Albumin 4.6 3.9 - 4.9 g/dL   Globulin, Total 2.5 1.5 - 4.5 g/dL   Bilirubin Total 0.3 0.0 - 1.2 mg/dL   Alkaline Phosphatase 47 44 - 121 IU/L   AST 24 0 - 40 IU/L   ALT 25 0 - 44 IU/L   CBC with Differential/Platelet  Result Value Ref Range   WBC 8.0 3.4 - 10.8 x10E3/uL   RBC 4.98 4.14 - 5.80 x10E6/uL   Hemoglobin 14.2 13.0 - 17.7 g/dL   Hematocrit 03.4 74.2 - 51.0 %  MCV 87 79 - 97 fL   MCH 28.5 26.6 - 33.0 pg   MCHC 32.8 31.5 - 35.7 g/dL   RDW 66.0 63.0 - 16.0 %   Platelets 260 150 - 450 x10E3/uL   Neutrophils 59 Not Estab. %   Lymphs 26 Not Estab. %   Monocytes 8 Not Estab. %   Eos 6 Not Estab. %   Basos 1 Not Estab. %   Neutrophils Absolute 4.8 1.4 - 7.0 x10E3/uL   Lymphocytes Absolute 2.1 0.7 - 3.1 x10E3/uL   Monocytes Absolute 0.6 0.1 - 0.9 x10E3/uL   EOS (ABSOLUTE) 0.5 (H) 0.0 - 0.4 x10E3/uL   Basophils Absolute 0.1 0.0 - 0.2 x10E3/uL   Immature Granulocytes 0 Not Estab. %   Immature Grans (Abs) 0.0 0.0 - 0.1 x10E3/uL  Lipid panel  Result Value Ref Range   Cholesterol, Total 148 100 - 199 mg/dL   Triglycerides 109 0 - 149 mg/dL   HDL 43 >32 mg/dL   VLDL Cholesterol Cal 22 5 - 40 mg/dL   LDL Chol Calc (NIH) 83 0 - 99 mg/dL   Chol/HDL Ratio 3.4 0.0 - 5.0 ratio    Assessment & Plan:   Problem List Items Addressed This Visit       Cardiovascular and Mediastinum   Hypertension associated with diabetes (HCC)   Relevant Medications   fenofibrate 160 MG tablet   glipiZIDE (GLUCOTROL XL) 10 MG 24 hr tablet   lisinopril-hydrochlorothiazide (ZESTORETIC) 20-12.5 MG tablet   metFORMIN (GLUCOPHAGE) 1000 MG tablet   rosuvastatin (CRESTOR) 40 MG tablet   Other Relevant Orders   Microalbumin / creatinine urine ratio     Endocrine   Type II diabetes mellitus (HCC) (Chronic)   Relevant Medications   glipiZIDE (GLUCOTROL XL) 10 MG 24 hr tablet   lisinopril-hydrochlorothiazide (ZESTORETIC) 20-12.5 MG tablet   metFORMIN (GLUCOPHAGE) 1000 MG tablet   rosuvastatin (CRESTOR) 40 MG tablet   Other Relevant Orders   Microalbumin / creatinine urine ratio   Hyperlipidemia associated with type 2 diabetes mellitus (HCC) - Primary   Relevant Medications    fenofibrate 160 MG tablet   glipiZIDE (GLUCOTROL XL) 10 MG 24 hr tablet   lisinopril-hydrochlorothiazide (ZESTORETIC) 20-12.5 MG tablet   metFORMIN (GLUCOPHAGE) 1000 MG tablet   rosuvastatin (CRESTOR) 40 MG tablet   Other Relevant Orders   Microalbumin / creatinine urine ratio    A1c was decent at 7.1, the rest of blood work looked pretty good.  No changes, continue to focus on diet. Follow up plan: Return in about 3 months (around 07/02/2023), or if symptoms worsen or fail to improve, for Diabetes recheck.  Counseling provided for all of the vaccine components Orders Placed This Encounter  Procedures   Microalbumin / creatinine urine ratio    Arville Care, MD [facility] 04/01/2023, 8:44 AM

## 2023-04-02 LAB — MICROALBUMIN / CREATININE URINE RATIO
Creatinine, Urine: 54.8 mg/dL
Microalb/Creat Ratio: <5 mg/g{creat} (ref 0–29)
Microalbumin, Urine: <3 ug/mL

## 2023-04-04 ENCOUNTER — Telehealth: Payer: Self-pay

## 2023-04-04 NOTE — Telephone Encounter (Signed)
Patient's wife informed we have received four boxes of Ozempic and it will be placed in the refrigerator for them to pick up.

## 2023-04-06 ENCOUNTER — Other Ambulatory Visit: Payer: Self-pay | Admitting: Family Medicine

## 2023-04-06 DIAGNOSIS — U071 COVID-19: Secondary | ICD-10-CM | POA: Diagnosis not present

## 2023-04-06 DIAGNOSIS — E1169 Type 2 diabetes mellitus with other specified complication: Secondary | ICD-10-CM

## 2023-04-06 HISTORY — DX: COVID-19: U07.1

## 2023-04-15 ENCOUNTER — Telehealth: Payer: Self-pay | Admitting: Family Medicine

## 2023-04-15 DIAGNOSIS — E1169 Type 2 diabetes mellitus with other specified complication: Secondary | ICD-10-CM

## 2023-04-15 NOTE — Telephone Encounter (Signed)
Please add future lab order. Pt is scheduled for labs on 07/02/23.

## 2023-04-19 NOTE — Telephone Encounter (Signed)
Placed future lab orders for the patient to come in before his appointment, make sure he makes a lab appointment

## 2023-05-06 LAB — FECAL OCCULT BLOOD, GUAIAC: Fecal Occult Blood: NEGATIVE

## 2023-06-07 ENCOUNTER — Encounter: Payer: Self-pay | Admitting: Pharmacist

## 2023-06-12 ENCOUNTER — Telehealth: Payer: Self-pay

## 2023-06-12 NOTE — Telephone Encounter (Signed)
Submitted RE-ENROLLMENT application for OZEMPIC to [employer] for patient assistance.   Phone: [phone]

## 2023-06-12 NOTE — Telephone Encounter (Signed)
Submitted RE-ENROLLMENT application for JARDIANCE to [employer] for patient assistance.   Phone: [phone]

## 2023-07-02 ENCOUNTER — Other Ambulatory Visit: Payer: Medicare HMO

## 2023-07-02 DIAGNOSIS — E1169 Type 2 diabetes mellitus with other specified complication: Secondary | ICD-10-CM | POA: Diagnosis not present

## 2023-07-02 LAB — CMP14+EGFR
ALT: 26 [IU]/L (ref 0–44)
AST: 27 [IU]/L (ref 0–40)
Albumin: 4.5 g/dL (ref 3.8–4.8)
Alkaline Phosphatase: 51 [IU]/L (ref 44–121)
BUN/Creatinine Ratio: 22 (ref 10–24)
BUN: 29 mg/dL — ABNORMAL HIGH (ref 8–27)
Bilirubin Total: 0.3 mg/dL (ref 0.0–1.2)
CO2: 25 mmol/L (ref 20–29)
Calcium: 9.7 mg/dL (ref 8.6–10.2)
Chloride: 101 mmol/L (ref 96–106)
Creatinine, Ser: 1.33 mg/dL — ABNORMAL HIGH (ref 0.76–1.27)
Globulin, Total: 2.4 g/dL (ref 1.5–4.5)
Glucose: 115 mg/dL — ABNORMAL HIGH (ref 70–99)
Potassium: 3.9 mmol/L (ref 3.5–5.2)
Sodium: 142 mmol/L (ref 134–144)
Total Protein: 6.9 g/dL (ref 6.0–8.5)
eGFR: 57 mL/min/{1.73_m2} — ABNORMAL LOW (ref >59)

## 2023-07-02 LAB — BAYER DCA HB A1C WAIVED: HB A1C (BAYER DCA - WAIVED): 7.3 % — ABNORMAL HIGH (ref 4.8–5.6)

## 2023-07-10 ENCOUNTER — Ambulatory Visit: Payer: Medicare HMO | Admitting: Family Medicine

## 2023-07-10 ENCOUNTER — Encounter: Payer: Self-pay | Admitting: Family Medicine

## 2023-07-10 VITALS — BP 134/74 | HR 76 | Ht 69.0 in | Wt 179.0 lb

## 2023-07-10 DIAGNOSIS — Z7984 Long term (current) use of oral hypoglycemic drugs: Secondary | ICD-10-CM | POA: Diagnosis not present

## 2023-07-10 DIAGNOSIS — Z7985 Long-term (current) use of injectable non-insulin antidiabetic drugs: Secondary | ICD-10-CM

## 2023-07-10 DIAGNOSIS — E1159 Type 2 diabetes mellitus with other circulatory complications: Secondary | ICD-10-CM | POA: Diagnosis not present

## 2023-07-10 DIAGNOSIS — E1169 Type 2 diabetes mellitus with other specified complication: Secondary | ICD-10-CM | POA: Diagnosis not present

## 2023-07-10 DIAGNOSIS — L89891 Pressure ulcer of other site, stage 1: Secondary | ICD-10-CM

## 2023-07-10 DIAGNOSIS — I152 Hypertension secondary to endocrine disorders: Secondary | ICD-10-CM | POA: Diagnosis not present

## 2023-07-10 DIAGNOSIS — E785 Hyperlipidemia, unspecified: Secondary | ICD-10-CM | POA: Diagnosis not present

## 2023-07-10 MED ORDER — SEMAGLUTIDE (1 MG/DOSE) 4 MG/3ML ~~LOC~~ SOPN: 1.0000 mg | PEN_INJECTOR | SUBCUTANEOUS | 3 refills | Status: DC

## 2023-07-10 NOTE — Telephone Encounter (Signed)
Philip Huang,  Pt received approval for Jardiance. Has not heard anything about Ozempic. Did is not get approved?

## 2023-07-10 NOTE — Progress Notes (Signed)
BP 134/74   Pulse 76   Ht 5\' 9"  (1.753 m)   Wt 179 lb (81.2 kg)   SpO2 96%   BMI 26.43 kg/m    Subjective:   Patient ID: Philip Huang, male    DOB: 1951/06/21, 72 y.o.   MRN: [id number]  HPI: Philip Huang is a 72 y.o. male presenting on 07/10/2023 for Medical Management of Chronic Issues, Hyperlipidemia, Hypertension, Diabetes, and Knee Injury (54m ago. Thinks something is in it. Draining.)   HPI Type 2 diabetes mellitus Patient comes in today for recheck of his diabetes. Patient has been currently taking Jardiance and glipizide and metformin and Ozempic. Patient is currently on an ACE inhibitor/ARB. Patient has seen an ophthalmologist this year. Patient denies any new issues with their feet. The symptom started onset as an adult hypertension and hyperlipidemia ARE RELATED TO DM   Hypertension Patient is currently on lisinopril-hydrochlorothiazide, and their blood pressure today is 134/74. Patient denies any lightheadedness or dizziness. Patient denies headaches, blurred vision, chest pains, shortness of breath, or weakness. Denies any side effects from medication and is content with current medication.   Hyperlipidemia Patient is coming in for recheck of his hyperlipidemia. The patient is currently taking Crestor. They deny any issues with myalgias or history of liver damage from it. They deny any focal numbness or weakness or chest pain.   Patient has small sore on the front of his knee from being on his knees so much, it opened up and drained a clear drainage.  Is not red or painful.  Occasionally had some bloody drainage at first.  He admits that he still on his knees a lot and does not wear kneepads.  Relevant past medical, surgical, family and social history reviewed and updated as indicated. Interim medical history since our last visit reviewed. Allergies and medications reviewed and updated.  Review of Systems  Constitutional:  Negative for chills and fever.  Eyes:   Negative for visual disturbance.  Respiratory:  Negative for shortness of breath and wheezing.   Cardiovascular:  Negative for chest pain and leg swelling.  Musculoskeletal:  Negative for back pain and gait problem.  Skin:  Positive for wound. Negative for color change and rash.  All other systems reviewed and are negative.   Per HPI unless specifically indicated above   Allergies as of 07/10/2023       Reactions   Doxycycline    Sulfa Antibiotics Rash        Medication List        Accurate as of July 10, 2023  4:18 PM. If you have any questions, ask your nurse or doctor.          Accu-Chek Aviva Plus test strip Generic drug: glucose blood TEST BLOOD SUGAR TWICE DAILY   Accu-Chek Softclix Lancets lancets TEST BLOOD SUGAR TWICE DAILY Dx E11.9   aspirin 81 MG tablet Take 81 mg by mouth daily.   DropSafe Alcohol Prep 70 % Pads USE AS DIRECTED TWICE DAILY   empagliflozin 25 MG Tabs tablet Commonly known as: Jardiance Take 1 tablet (25 mg total) by mouth daily before breakfast.   fenofibrate 160 MG tablet Take 1 tablet (160 mg total) by mouth daily.   fish oil-omega-3 fatty acids 1000 MG capsule Take 1 g by mouth daily.   glipiZIDE 10 MG 24 hr tablet Commonly known as: GLUCOTROL XL Take 1 tablet (10 mg total) by mouth daily with breakfast.   lisinopril-hydrochlorothiazide 20-12.5 MG tablet  Commonly known as: ZESTORETIC Take 2 tablets by mouth daily.   MENS 50+ ADVANCED PO Take by mouth.   metFORMIN 1000 MG tablet Commonly known as: GLUCOPHAGE Take 1 tablet (1,000 mg total) by mouth 2 (two) times daily with a meal.   nitroGLYCERIN 0.4 MG SL tablet Commonly known as: NITROSTAT Place 1 tablet (0.4 mg total) under the tongue every 5 (five) minutes as needed.   rosuvastatin 40 MG tablet Commonly known as: CRESTOR Take 1 tablet (40 mg total) by mouth daily.   Semaglutide (1 MG/DOSE) 4 MG/3ML Sopn Inject 1 mg as directed once a week.   Vitamin D  50 MCG (2000 UT) tablet Take 2,000 Units by mouth daily.         Objective:   BP 134/74   Pulse 76   Ht 5\' 9"  (1.753 m)   Wt 179 lb (81.2 kg)   SpO2 96%   BMI 26.43 kg/m   Wt Readings from Last 3 Encounters:  07/10/23 179 lb (81.2 kg)  04/01/23 180 lb (81.6 kg)  01/21/23 170 lb (77.1 kg)    Physical Exam Vitals and nursing note reviewed.  Constitutional:      General: He is not in acute distress.    Appearance: He is well-developed. He is not diaphoretic.  Eyes:     General: No scleral icterus.    Conjunctiva/sclera: Conjunctivae normal.  Neck:     Thyroid: No thyromegaly.  Cardiovascular:     Rate and Rhythm: Normal rate and regular rhythm.     Heart sounds: Normal heart sounds. No murmur heard. Pulmonary:     Effort: Pulmonary effort is normal. No respiratory distress.     Breath sounds: Normal breath sounds. No wheezing.  Musculoskeletal:        General: No swelling. Normal range of motion.     Cervical back: Neck supple.  Lymphadenopathy:     Cervical: No cervical adenopathy.  Skin:    General: Skin is warm and dry.     Findings: Wound present. No rash.       Neurological:     Mental Status: He is alert and oriented to person, place, and time.     Coordination: Coordination normal.  Psychiatric:        Behavior: Behavior normal.     Results for orders placed or performed in visit on 07/02/23  CMP14+EGFR  Result Value Ref Range   Glucose 115 (H) 70 - 99 mg/dL   BUN 29 (H) 8 - 27 mg/dL   Creatinine, Ser 1.61 (H) 0.76 - 1.27 mg/dL   eGFR 57 (L) >09 UE/AVW/0.98   BUN/Creatinine Ratio 22 10 - 24   Sodium 142 134 - 144 mmol/L   Potassium 3.9 3.5 - 5.2 mmol/L   Chloride 101 96 - 106 mmol/L   CO2 25 20 - 29 mmol/L   Calcium 9.7 8.6 - 10.2 mg/dL   Total Protein 6.9 6.0 - 8.5 g/dL   Albumin 4.5 3.8 - 4.8 g/dL   Globulin, Total 2.4 1.5 - 4.5 g/dL   Bilirubin Total 0.3 0.0 - 1.2 mg/dL   Alkaline Phosphatase 51 44 - 121 IU/L   AST 27 0 - 40 IU/L   ALT  26 0 - 44 IU/L  Bayer DCA Hb A1c Waived  Result Value Ref Range   HB A1C (BAYER DCA - WAIVED) 7.3 (H) 4.8 - 5.6 %    Assessment & Plan:   Problem List Items Addressed This Visit  Cardiovascular and Mediastinum   Hypertension associated with diabetes (HCC) - Primary   Relevant Medications   Semaglutide, 1 MG/DOSE, 4 MG/3ML SOPN     Endocrine   Type II diabetes mellitus (HCC) (Chronic)   Relevant Medications   Semaglutide, 1 MG/DOSE, 4 MG/3ML SOPN   Hyperlipidemia associated with type 2 diabetes mellitus (HCC)   Relevant Medications   Semaglutide, 1 MG/DOSE, 4 MG/3ML SOPN   Other Visit Diagnoses     Pressure injury of right knee, stage 1         A1c up, focus on diet.  Blood pressure and everything else looks good today.  For pressure sore on the right knee recommended that he needs to stay off of it and stick Vaseline and triple antibiotic ointment on it twice a day leave it open to air at night and when he is home.  Follow up plan: Return in about 3 months (around 10/08/2023), or if symptoms worsen or fail to improve, for diabetes.  Counseling provided for all of the vaccine components No orders of the defined types were placed in this encounter.   Arville Care, MD [facility] 07/10/2023, 4:18 PM

## 2023-07-11 ENCOUNTER — Telehealth: Payer: Self-pay

## 2023-07-11 ENCOUNTER — Other Ambulatory Visit (HOSPITAL_COMMUNITY): Payer: Self-pay

## 2023-07-11 DIAGNOSIS — E1169 Type 2 diabetes mellitus with other specified complication: Secondary | ICD-10-CM

## 2023-07-11 NOTE — Telephone Encounter (Signed)
Per novo nordisk rep, application submitted too soon. Pt is uninsured and current enrollment ends 08/01/23. Renewal must be submitted after 07/02/23.  Will process and submit updated renewal application.

## 2023-07-11 NOTE — Progress Notes (Signed)
Pharmacy Medication Assistance Program Note    07/11/2023  Patient ID: Philip Huang, male   DOB: 1951-07-15, 72 y.o.   MRN: [id number]     06/12/2023  Outreach Medication One  Manufacturer Medication One [employer]  [employer] Drugs Jardiance  Type of Assistance Manufacturer Assistance  Date Application Received From Patient 06/07/2023  Application Items Received From Patient Application;Proof of Income  Date Application Submitted to Manufacturer 06/12/2023  Method Application Sent to Manufacturer Fax  Patient Assistance Determination Approved  Approval End Date 08/05/2024

## 2023-07-11 NOTE — Progress Notes (Signed)
Pharmacy Medication Assistance Program Note    07/11/2023  Patient ID: Philip Huang, male  DOB: 1950/12/31, 72 y.o.  MRN:  [id number]     07/11/2023  Outreach Medication Three  Manufacturer Medication Three [employer]  Nordisk Drugs Ozempic  Type of Radiographer, therapeutic Assistance  Name of Prescriber Ivin Booty [name]  Date Application Submitted to Manufacturer 07/11/2023  Method Application Sent to [employer]      Submitted patient renewal portion online.  Lubertha Sayres sent the provider portion to your email (should be an email directly from novo nordisk).

## 2023-07-12 MED ORDER — EMPAGLIFLOZIN 25 MG PO TABS
25.0000 mg | ORAL_TABLET | Freq: Every day | ORAL | 3 refills | Status: DC
Start: 2023-07-12 — End: 2024-03-27

## 2023-07-12 NOTE — Telephone Encounter (Signed)
Raynelle Fanning completed provider pages for novo nordisk renewal 07/11/23

## 2023-07-12 NOTE — Telephone Encounter (Signed)
Sent prescription of Jardiance to his pharmacy

## 2023-07-12 NOTE — Telephone Encounter (Signed)
Please send a new rx to [facility] for patients Jardiance prescription. Thank you!

## 2023-07-22 ENCOUNTER — Telehealth: Payer: Self-pay | Admitting: Family Medicine

## 2023-07-22 NOTE — Telephone Encounter (Signed)
Informed that we do not have any samples in the office, also let her know that paperwork for pt assistance was faxed to [facility] this morning.

## 2023-07-23 ENCOUNTER — Other Ambulatory Visit (HOSPITAL_COMMUNITY): Payer: Self-pay

## 2023-07-23 NOTE — Telephone Encounter (Signed)
Pts wife left voicemail regarding pt's ozempic.  Unfortunately the previous shipment delivered 03/2023 was the last shipment for the year (4 month supply) and should last until end of Dec.   Novo nordisk renewal is currently pending. Once approved, next shipment will go out beginning of January.   Any suggestions?

## 2023-07-24 NOTE — Telephone Encounter (Addendum)
Left message with pt's wife regarding available samples, and informed that's renewal application has been submitted.

## 2023-08-02 ENCOUNTER — Telehealth: Payer: Self-pay | Admitting: Family Medicine

## 2023-08-02 ENCOUNTER — Other Ambulatory Visit: Payer: Self-pay

## 2023-08-02 DIAGNOSIS — E1159 Type 2 diabetes mellitus with other circulatory complications: Secondary | ICD-10-CM

## 2023-08-02 DIAGNOSIS — E1169 Type 2 diabetes mellitus with other specified complication: Secondary | ICD-10-CM

## 2023-08-02 NOTE — Telephone Encounter (Signed)
Future lab orders placed

## 2023-08-06 NOTE — Telephone Encounter (Signed)
Received notification from [employer]OVO [employer] regarding approval for OZEMPIC. Patient assistance approved until 07/30/24.  Medication will ship to [facility]  Company phone: [phone]

## 2023-09-02 ENCOUNTER — Other Ambulatory Visit: Payer: Self-pay | Admitting: Family Medicine

## 2023-10-08 ENCOUNTER — Other Ambulatory Visit: Payer: Medicare HMO

## 2023-10-08 DIAGNOSIS — I152 Hypertension secondary to endocrine disorders: Secondary | ICD-10-CM | POA: Diagnosis not present

## 2023-10-08 DIAGNOSIS — E1169 Type 2 diabetes mellitus with other specified complication: Secondary | ICD-10-CM | POA: Diagnosis not present

## 2023-10-08 DIAGNOSIS — E785 Hyperlipidemia, unspecified: Secondary | ICD-10-CM | POA: Diagnosis not present

## 2023-10-08 DIAGNOSIS — E1159 Type 2 diabetes mellitus with other circulatory complications: Secondary | ICD-10-CM | POA: Diagnosis not present

## 2023-10-08 LAB — BAYER DCA HB A1C WAIVED: HB A1C (BAYER DCA - WAIVED): 7.5 % — ABNORMAL HIGH (ref 4.8–5.6)

## 2023-10-08 LAB — LIPID PANEL

## 2023-10-09 LAB — CBC WITH DIFFERENTIAL/PLATELET
Basophils Absolute: 0.1 10*3/uL (ref 0.0–0.2)
Basos: 1 %
EOS (ABSOLUTE): 0.5 10*3/uL — ABNORMAL HIGH (ref 0.0–0.4)
Eos: 7 %
Hematocrit: 41.8 % (ref 37.5–51.0)
Hemoglobin: 14.2 g/dL (ref 13.0–17.7)
Immature Grans (Abs): 0 10*3/uL (ref 0.0–0.1)
Immature Granulocytes: 0 %
Lymphocytes Absolute: 2.5 10*3/uL (ref 0.7–3.1)
Lymphs: 32 %
MCH: 29.8 pg (ref 26.6–33.0)
MCHC: 34 g/dL (ref 31.5–35.7)
MCV: 88 fL (ref 79–97)
Monocytes Absolute: 0.6 10*3/uL (ref 0.1–0.9)
Monocytes: 8 %
Neutrophils Absolute: 4 10*3/uL (ref 1.4–7.0)
Neutrophils: 52 %
Platelets: 263 10*3/uL (ref 150–450)
RBC: 4.77 x10E6/uL (ref 4.14–5.80)
RDW: 13.1 % (ref 11.6–15.4)
WBC: 7.7 10*3/uL (ref 3.4–10.8)

## 2023-10-09 LAB — LIPID PANEL
Cholesterol, Total: 154 mg/dL (ref 100–199)
HDL: 39 mg/dL — ABNORMAL LOW (ref >39)
LDL CALC COMMENT:: 3.9 ratio (ref 0.0–5.0)
LDL Chol Calc (NIH): 87 mg/dL (ref 0–99)
Triglycerides: 158 mg/dL — ABNORMAL HIGH (ref 0–149)
VLDL Cholesterol Cal: 28 mg/dL (ref 5–40)

## 2023-10-09 LAB — CMP14+EGFR
ALT: 25 IU/L (ref 0–44)
AST: 25 IU/L (ref 0–40)
Albumin: 4.5 g/dL (ref 3.8–4.8)
Alkaline Phosphatase: 57 IU/L (ref 44–121)
BUN/Creatinine Ratio: 21 (ref 10–24)
BUN: 25 mg/dL (ref 8–27)
Bilirubin Total: 0.3 mg/dL (ref 0.0–1.2)
CO2: 23 mmol/L (ref 20–29)
Calcium: 9.8 mg/dL (ref 8.6–10.2)
Chloride: 101 mmol/L (ref 96–106)
Creatinine, Ser: 1.2 mg/dL (ref 0.76–1.27)
Globulin, Total: 2.5 g/dL (ref 1.5–4.5)
Glucose: 117 mg/dL — ABNORMAL HIGH (ref 70–99)
Potassium: 4.2 mmol/L (ref 3.5–5.2)
Sodium: 140 mmol/L (ref 134–144)
Total Protein: 7 g/dL (ref 6.0–8.5)
eGFR: 65 mL/min/{1.73_m2} (ref >59)

## 2023-10-11 ENCOUNTER — Ambulatory Visit: Payer: Medicare HMO | Admitting: Family Medicine

## 2023-10-11 ENCOUNTER — Encounter: Payer: Self-pay | Admitting: Family Medicine

## 2023-10-11 VITALS — BP 148/65 | HR 61 | Ht 69.0 in | Wt 181.0 lb

## 2023-10-11 DIAGNOSIS — I152 Hypertension secondary to endocrine disorders: Secondary | ICD-10-CM | POA: Diagnosis not present

## 2023-10-11 DIAGNOSIS — E1169 Type 2 diabetes mellitus with other specified complication: Secondary | ICD-10-CM

## 2023-10-11 DIAGNOSIS — Z7985 Long-term (current) use of injectable non-insulin antidiabetic drugs: Secondary | ICD-10-CM | POA: Diagnosis not present

## 2023-10-11 DIAGNOSIS — E785 Hyperlipidemia, unspecified: Secondary | ICD-10-CM

## 2023-10-11 DIAGNOSIS — E1159 Type 2 diabetes mellitus with other circulatory complications: Secondary | ICD-10-CM | POA: Diagnosis not present

## 2023-10-11 DIAGNOSIS — Z7984 Long term (current) use of oral hypoglycemic drugs: Secondary | ICD-10-CM

## 2023-10-11 MED ORDER — SEMAGLUTIDE (2 MG/DOSE) 8 MG/3ML ~~LOC~~ SOPN: 2.0000 mg | PEN_INJECTOR | SUBCUTANEOUS | 3 refills | Status: AC

## 2023-10-11 NOTE — Progress Notes (Signed)
 BP (!) 148/65   Pulse 61   Ht 5\' 9"  (1.753 m)   Wt 181 lb (82.1 kg)   SpO2 98%   BMI 26.73 kg/m    Subjective:   Patient ID: Philip Huang, male    DOB: 06/04/1951, 73 y.o.   MRN: [id number]  HPI: Philip Huang is a 73 y.o. male presenting on 10/11/2023 for Medical Management of Chronic Issues, Diabetes, Hyperlipidemia, and Hypertension   HPI Type 2 diabetes mellitus Patient comes in today for recheck of his diabetes. Patient has been currently taking Jardiance and glipizide and metformin and Ozempic. Patient is currently on an ACE inhibitor/ARB. Patient has not seen an ophthalmologist this year. Patient denies any new issues with their feet. The symptom started onset as an adult hypertension and hyperlipidemia ARE RELATED TO DM   Hypertension Patient is currently on lisinopril-hydrochlorothiazide, and their blood pressure today is 148/65. Patient denies any lightheadedness or dizziness. Patient denies headaches, blurred vision, chest pains, shortness of breath, or weakness. Denies any side effects from medication and is content with current medication.   Hyperlipidemia Patient is coming in for recheck of his hyperlipidemia. The patient is currently taking fenofibrate and fish oils and Crestor. They deny any issues with myalgias or history of liver damage from it. They deny any focal numbness or weakness or chest pain.   Relevant past medical, surgical, family and social history reviewed and updated as indicated. Interim medical history since our last visit reviewed. Allergies and medications reviewed and updated.  Review of Systems  Constitutional:  Negative for chills and fever.  Eyes:  Negative for visual disturbance.  Respiratory:  Negative for shortness of breath and wheezing.   Cardiovascular:  Negative for chest pain and leg swelling.  Musculoskeletal:  Negative for back pain and gait problem.  Skin:  Negative for rash.  Neurological:  Negative for dizziness,  weakness and light-headedness.  All other systems reviewed and are negative.   Per HPI unless specifically indicated above   Allergies as of 10/11/2023       Reactions   Doxycycline    Sulfa Antibiotics Rash        Medication List        Accurate as of October 11, 2023  8:23 AM. If you have any questions, ask your nurse or doctor.          STOP taking these medications    Semaglutide (1 MG/DOSE) 4 MG/3ML Sopn Replaced by: Semaglutide (2 MG/DOSE) 8 MG/3ML Sopn Stopped by: Ivin Booty A Harlean Regula       TAKE these medications    Accu-Chek Aviva Plus test strip Generic drug: glucose blood TEST BLOOD SUGAR TWICE DAILY   Accu-Chek Softclix Lancets lancets TEST BLOOD SUGAR TWICE DAILY Dx E11.9   aspirin 81 MG tablet Take 81 mg by mouth daily.   DropSafe Alcohol Prep 70 % Pads TEST BLOOD SUGAR TWICE DAILY Dx E11.9   empagliflozin 25 MG Tabs tablet Commonly known as: Jardiance Take 1 tablet (25 mg total) by mouth daily before breakfast.   fenofibrate 160 MG tablet Take 1 tablet (160 mg total) by mouth daily.   fish oil-omega-3 fatty acids 1000 MG capsule Take 1 g by mouth daily.   glipiZIDE 10 MG 24 hr tablet Commonly known as: GLUCOTROL XL Take 1 tablet (10 mg total) by mouth daily with breakfast.   lisinopril-hydrochlorothiazide 20-12.5 MG tablet Commonly known as: ZESTORETIC Take 2 tablets by mouth daily.   MENS 50+ ADVANCED PO  Take by mouth.   metFORMIN 1000 MG tablet Commonly known as: GLUCOPHAGE Take 1 tablet (1,000 mg total) by mouth 2 (two) times daily with a meal.   nitroGLYCERIN 0.4 MG SL tablet Commonly known as: NITROSTAT Place 1 tablet (0.4 mg total) under the tongue every 5 (five) minutes as needed.   rosuvastatin 40 MG tablet Commonly known as: CRESTOR Take 1 tablet (40 mg total) by mouth daily.   Semaglutide (2 MG/DOSE) 8 MG/3ML Sopn Inject 2 mg as directed once a week. Replaces: Semaglutide (1 MG/DOSE) 4 MG/3ML Sopn Started by:  Elige Radon Kadarrius Yanke   Vitamin D 50 MCG (2000 UT) tablet Take 2,000 Units by mouth daily.         Objective:   BP (!) 148/65   Pulse 61   Ht 5\' 9"  (1.753 m)   Wt 181 lb (82.1 kg)   SpO2 98%   BMI 26.73 kg/m   Wt Readings from Last 3 Encounters:  10/11/23 181 lb (82.1 kg)  07/10/23 179 lb (81.2 kg)  04/01/23 180 lb (81.6 kg)    Physical Exam Vitals and nursing note reviewed.  Constitutional:      General: He is not in acute distress.    Appearance: He is well-developed. He is not diaphoretic.  Eyes:     General: No scleral icterus.    Conjunctiva/sclera: Conjunctivae normal.  Neck:     Thyroid: No thyromegaly.  Cardiovascular:     Rate and Rhythm: Normal rate and regular rhythm.     Heart sounds: Normal heart sounds. No murmur heard. Pulmonary:     Effort: Pulmonary effort is normal. No respiratory distress.     Breath sounds: Normal breath sounds. No wheezing.  Musculoskeletal:        General: No swelling. Normal range of motion.     Cervical back: Neck supple.  Lymphadenopathy:     Cervical: No cervical adenopathy.  Skin:    General: Skin is warm and dry.     Findings: No rash.  Neurological:     Mental Status: He is alert and oriented to person, place, and time.     Coordination: Coordination normal.  Psychiatric:        Behavior: Behavior normal.     Results for orders placed or performed in visit on 10/08/23  Bayer DCA Hb A1c Waived   Collection Time: 10/08/23  8:33 AM  Result Value Ref Range   HB A1C (BAYER DCA - WAIVED) 7.5 (H) 4.8 - 5.6 %  Lipid panel   Collection Time: 10/08/23  8:35 AM  Result Value Ref Range   Cholesterol, Total 154 100 - 199 mg/dL   Triglycerides 161 (H) 0 - 149 mg/dL   HDL 39 (L) >09 mg/dL   VLDL Cholesterol Cal 28 5 - 40 mg/dL   LDL Chol Calc (NIH) 87 0 - 99 mg/dL   Chol/HDL Ratio 3.9 0.0 - 5.0 ratio  CMP14+EGFR   Collection Time: 10/08/23  8:35 AM  Result Value Ref Range   Glucose 117 (H) 70 - 99 mg/dL   BUN 25 8 -  27 mg/dL   Creatinine, Ser 6.04 0.76 - 1.27 mg/dL   eGFR 65 >54 UJ/WJX/9.14   BUN/Creatinine Ratio 21 10 - 24   Sodium 140 134 - 144 mmol/L   Potassium 4.2 3.5 - 5.2 mmol/L   Chloride 101 96 - 106 mmol/L   CO2 23 20 - 29 mmol/L   Calcium 9.8 8.6 - 10.2 mg/dL   Total  Protein 7.0 6.0 - 8.5 g/dL   Albumin 4.5 3.8 - 4.8 g/dL   Globulin, Total 2.5 1.5 - 4.5 g/dL   Bilirubin Total 0.3 0.0 - 1.2 mg/dL   Alkaline Phosphatase 57 44 - 121 IU/L   AST 25 0 - 40 IU/L   ALT 25 0 - 44 IU/L  CBC with Differential/Platelet   Collection Time: 10/08/23  8:35 AM  Result Value Ref Range   WBC 7.7 3.4 - 10.8 x10E3/uL   RBC 4.77 4.14 - 5.80 x10E6/uL   Hemoglobin 14.2 13.0 - 17.7 g/dL   Hematocrit 09.8 11.9 - 51.0 %   MCV 88 79 - 97 fL   MCH 29.8 26.6 - 33.0 pg   MCHC 34.0 31.5 - 35.7 g/dL   RDW 14.7 82.9 - 56.2 %   Platelets 263 150 - 450 x10E3/uL   Neutrophils 52 Not Estab. %   Lymphs 32 Not Estab. %   Monocytes 8 Not Estab. %   Eos 7 Not Estab. %   Basos 1 Not Estab. %   Neutrophils Absolute 4.0 1.4 - 7.0 x10E3/uL   Lymphocytes Absolute 2.5 0.7 - 3.1 x10E3/uL   Monocytes Absolute 0.6 0.1 - 0.9 x10E3/uL   EOS (ABSOLUTE) 0.5 (H) 0.0 - 0.4 x10E3/uL   Basophils Absolute 0.1 0.0 - 0.2 x10E3/uL   Immature Granulocytes 0 Not Estab. %   Immature Grans (Abs) 0.0 0.0 - 0.1 x10E3/uL    Assessment & Plan:   Problem List Items Addressed This Visit       Cardiovascular and Mediastinum   Hypertension associated with diabetes (HCC)   Relevant Medications   Semaglutide, 2 MG/DOSE, 8 MG/3ML SOPN     Endocrine   Type II diabetes mellitus (HCC) - Primary (Chronic)   Relevant Medications   Semaglutide, 2 MG/DOSE, 8 MG/3ML SOPN   Hyperlipidemia associated with type 2 diabetes mellitus (HCC)   Relevant Medications   Semaglutide, 2 MG/DOSE, 8 MG/3ML SOPN    A1c is up at 7.5, will go ahead and increase his Ozempic.  Also discussed reducing the snacking especially potato chips and crackers  Blood  pressure and everything else looks okay on the blood work.  Triglycerides are slightly elevated but likely because of the blood sugars as well. Follow up plan: Return in about 3 months (around 01/11/2024), or if symptoms worsen or fail to improve, for Diabetes recheck.  Counseling provided for all of the vaccine components No orders of the defined types were placed in this encounter.   Arville Care, MD [facility] 10/11/2023, 8:23 AM

## 2023-10-14 ENCOUNTER — Telehealth: Payer: Self-pay

## 2023-10-14 NOTE — Telephone Encounter (Signed)
 Rec'd message from patients wife stating patients Ozempic has been increased to 2mg .   Checked recent office notes and do see this was changed. I will get the novo nordisk dose change form faxed to pcp as soon as possible.

## 2023-10-14 NOTE — Telephone Encounter (Signed)
 Faxed Ozempic 2mg  dose increase to PCP for signature.

## 2023-11-05 ENCOUNTER — Telehealth: Payer: Self-pay | Admitting: Family Medicine

## 2023-11-05 NOTE — Telephone Encounter (Signed)
 Faxed dose increase/refill to pcp again 11/05/23.   Requested completed form be faxed directly to company.

## 2023-11-05 NOTE — Telephone Encounter (Signed)
 Copied from CRM [id number]. Topic: General - Other >> Nov 05, 2023 11:29 AM Franchot Heidelberg wrote: Reason for CRM: Philip Huang called reporting that a fax has been submitted to the office today regarding the patient's ozempic prescription changes. Philip says they have been waiting for 2 weeks to have this resolved. Please advise  Best contact: [phone]  [facility] says Siri Cole has not been sufficient help and she is unsatisfied with her service.

## 2023-11-07 NOTE — Telephone Encounter (Signed)
 Dose increase faxed to novo [employer]ord[employer].

## 2023-11-12 NOTE — Telephone Encounter (Signed)
Dose increase approved.

## 2023-11-18 NOTE — Telephone Encounter (Signed)
 Confirmed approval with wife.

## 2023-11-27 ENCOUNTER — Telehealth: Payer: Self-pay

## 2023-11-27 ENCOUNTER — Telehealth: Payer: Self-pay | Admitting: Family Medicine

## 2023-11-27 NOTE — Telephone Encounter (Signed)
 Spoke with Hope about patients ozempic  arriving in office, [place] stated that [place] told her 3 seperate times that she had done her part in sending the perscription. After a month of waiting Hope called "where the ozempic  comes from" and they told her that they had not recieved anything. They had to fax Dr. Steen Eden to get the information needed to fill the prescription. Hope stated "if I knew who to complain to about Aron Lard and her lack of care for the patients she is supposed to be helping, I would call and complain. But what's the point anyway, they probably wouldn't care either".

## 2023-11-27 NOTE — Telephone Encounter (Signed)
 Per DPR spoke with spouse Hope to inform that Ozempic  was in office and ready for pick up.

## 2023-11-27 NOTE — Telephone Encounter (Signed)
 I spoke to pt's spouse and advised he would ntbs and scheduled him tomorrow with DOD (which is Museum/gallery exhibitions officer) at 8:20.

## 2023-11-28 ENCOUNTER — Encounter: Payer: Self-pay | Admitting: Family Medicine

## 2023-11-28 ENCOUNTER — Ambulatory Visit (INDEPENDENT_AMBULATORY_CARE_PROVIDER_SITE_OTHER): Admitting: Family Medicine

## 2023-11-28 VITALS — BP 127/60 | HR 63 | Ht 69.0 in | Wt 178.0 lb

## 2023-11-28 DIAGNOSIS — S81001A Unspecified open wound, right knee, initial encounter: Secondary | ICD-10-CM

## 2023-11-28 MED ORDER — CEPHALEXIN 500 MG PO CAPS
500.0000 mg | ORAL_CAPSULE | Freq: Four times a day (QID) | ORAL | 0 refills | Status: DC
Start: 2023-11-28 — End: 2024-01-16

## 2023-11-28 NOTE — Progress Notes (Signed)
 BP 127/60   Pulse 63   Ht 5\' 9"  (1.753 m)   Wt 178 lb (80.7 kg)   SpO2 96%   BMI 26.29 kg/m    Subjective:   Patient ID: Philip Huang, male    DOB: [birth year], 73 y.o.   MRN: [id number]  HPI: Philip Huang is a 73 y.o. male presenting on 11/28/2023 for Pressure sore (Right knee- present for months. Yellow drainage.)   HPI Right knee wound Patient has a wound overlying his right knee that has been present somewhat for 9 months although over the past week he has noticed that he started to have some purulent material and draining a lot more.  He denies any pain or soreness.  He does have a little bit of redness on it and has been taking some antibiotic cream and a Band-Aid on it.  He thinks it may have been a pressure sore from working on his knees too much without kneepads.  Relevant past medical, surgical, family and social history reviewed and updated as indicated. Interim medical history since our last visit reviewed. Allergies and medications reviewed and updated.  Review of Systems  Constitutional:  Negative for chills and fever.  Eyes:  Negative for visual disturbance.  Respiratory:  Negative for shortness of breath and wheezing.   Cardiovascular:  Negative for chest pain and leg swelling.  Musculoskeletal:  Negative for back pain and gait problem.  Skin:  Positive for color change and wound. Negative for rash.  Neurological:  Negative for dizziness.  All other systems reviewed and are negative.   Per HPI unless specifically indicated above   Allergies as of 11/28/2023       Reactions   Doxycycline    Sulfa  Antibiotics Rash        Medication List        Accurate as of November 28, 2023  8:42 AM. If you have any questions, ask your nurse or doctor.          Accu-Chek Aviva Plus test strip Generic drug: glucose blood TEST BLOOD SUGAR TWICE DAILY   Accu-Chek Softclix Lancets lancets TEST BLOOD SUGAR TWICE DAILY Dx E11.9   aspirin 81 MG  tablet Take 81 mg by mouth daily.   cephALEXin  500 MG capsule Commonly known as: KEFLEX  Take 1 capsule (500 mg total) by mouth 4 (four) times daily. Started by: Lucio Sabin Kimbley Sprague   DropSafe Alcohol  Prep 70 % Pads TEST BLOOD SUGAR TWICE DAILY Dx E11.9   empagliflozin  25 MG Tabs tablet Commonly known as: Jardiance  Take 1 tablet (25 mg total) by mouth daily before breakfast.   fenofibrate  160 MG tablet Take 1 tablet (160 mg total) by mouth daily.   fish oil-omega-3 fatty acids 1000 MG capsule Take 1 g by mouth daily.   glipiZIDE  10 MG 24 hr tablet Commonly known as: GLUCOTROL  XL Take 1 tablet (10 mg total) by mouth daily with breakfast.   lisinopril -hydrochlorothiazide  20-12.5 MG tablet Commonly known as: ZESTORETIC  Take 2 tablets by mouth daily.   MENS 50+ ADVANCED PO Take by mouth.   metFORMIN  1000 MG tablet Commonly known as: GLUCOPHAGE  Take 1 tablet (1,000 mg total) by mouth 2 (two) times daily with a meal.   nitroGLYCERIN  0.4 MG SL tablet Commonly known as: NITROSTAT  Place 1 tablet (0.4 mg total) under the tongue every 5 (five) minutes as needed.   rosuvastatin  40 MG tablet Commonly known as: CRESTOR  Take 1 tablet (40 mg total) by mouth daily.  Semaglutide  (2 MG/DOSE) 8 MG/3ML Sopn Inject 2 mg as directed once a week.   Vitamin D  50 MCG (2000 UT) tablet Take 2,000 Units by mouth daily.         Objective:   BP 127/60   Pulse 63   Ht 5\' 9"  (1.753 m)   Wt 178 lb (80.7 kg)   SpO2 96%   BMI 26.29 kg/m   Wt Readings from Last 3 Encounters:  11/28/23 178 lb (80.7 kg)  10/11/23 181 lb (82.1 kg)  07/10/23 179 lb (81.2 kg)    Physical Exam Vitals and nursing note reviewed.  Constitutional:      Appearance: Normal appearance.  Skin:    General: Skin is warm and dry.     Findings: Wound (Pinpoint opening on the right knee overlying the kneecap.  Purulent drainage.  Nontender with a small amount of erythema surrounding.  With probe able to insert more  than 1/8 of an inch) present.  Neurological:     Mental Status: He is alert.       Assessment & Plan:   Problem List Items Addressed This Visit   None Visit Diagnoses       Open wound of right knee without complication, initial encounter    -  Primary   Relevant Medications   cephALEXin  (KEFLEX ) 500 MG capsule   Other Relevant Orders   Ambulatory referral to Wound Clinic       Will send Keflex  and refer to wound care. Follow up plan: Return if symptoms worsen or fail to improve.  Counseling provided for all of the vaccine components Orders Placed This Encounter  Procedures   Ambulatory referral to Wound Clinic    Jolyne Needs, MD [facility] 11/28/2023, 8:42 AM

## 2023-12-06 ENCOUNTER — Other Ambulatory Visit: Payer: Self-pay | Admitting: Family Medicine

## 2023-12-26 ENCOUNTER — Ambulatory Visit (INDEPENDENT_AMBULATORY_CARE_PROVIDER_SITE_OTHER): Admitting: Orthopedic Surgery

## 2023-12-26 ENCOUNTER — Other Ambulatory Visit (INDEPENDENT_AMBULATORY_CARE_PROVIDER_SITE_OTHER): Payer: Self-pay

## 2023-12-26 DIAGNOSIS — M25561 Pain in right knee: Secondary | ICD-10-CM

## 2023-12-26 DIAGNOSIS — G8929 Other chronic pain: Secondary | ICD-10-CM

## 2023-12-26 DIAGNOSIS — S81001A Unspecified open wound, right knee, initial encounter: Secondary | ICD-10-CM

## 2023-12-26 MED ORDER — MUPIROCIN 2 % EX OINT: 1.0000 | TOPICAL_OINTMENT | Freq: Two times a day (BID) | CUTANEOUS | 3 refills | Status: DC

## 2023-12-27 ENCOUNTER — Other Ambulatory Visit: Payer: Self-pay | Admitting: Orthopedic Surgery

## 2023-12-27 ENCOUNTER — Telehealth: Payer: Self-pay | Admitting: Orthopedic Surgery

## 2023-12-27 MED ORDER — DOXYCYCLINE HYCLATE 100 MG PO TABS: 100.0000 mg | ORAL_TABLET | Freq: Two times a day (BID) | ORAL | 0 refills | Status: DC

## 2023-12-27 NOTE — Telephone Encounter (Signed)
 I called and sw pt's wife to advise.

## 2023-12-27 NOTE — Telephone Encounter (Signed)
 Patient's wife called. He does want an antibiotic. Would like something called in for him.

## 2023-12-27 NOTE — Telephone Encounter (Signed)
 You say this pt in the office as a new patient yesterday for his knee. Small pin hole sized wound of the knee. Called and said he would like an ABX please advise.

## 2023-12-30 ENCOUNTER — Encounter: Payer: Self-pay | Admitting: Orthopedic Surgery

## 2023-12-30 NOTE — Progress Notes (Signed)
 Office Visit Note   Patient: Philip Huang           Date of Birth: 1951/01/09           MRN: [id number] Visit Date: 12/26/2023              Requested by: [name], Lucio Sabin, MD [address] [place],  [place] [address] PCP: [name], Lucio Sabin, MD  Chief Complaint  Patient presents with   Right Knee - Wound Check      HPI: Patient is a 73 year old gentleman is seen for initial evaluation for ulceration right knee.  Patient states he started with ulceration about a year ago when he was building a deck.  Patient states that he has had a pin size hole over the patella with purulent drainage.  Patient has been on Keflex  500 mg twice a day for 7 days.  Patient states he has no pain with walking but does have pain with kneeling.  Patient states he has an allergy to doxycycline he just does not know what it is.  Assessment & Plan: Visit Diagnoses:  1. Chronic pain of right knee   2. Open knee wound, right, initial encounter     Plan: Prescription is called in for Bactroban.  He will apply this twice a day.  Discussed that if there is persistent drainage we would plan for surgical excision of the bursa.  Follow-Up Instructions: Return in about 2 weeks (around 01/09/2024).   Ortho Exam  Patient is alert, oriented, no adenopathy, well-dressed, normal affect, normal respiratory effort. Examination patient has no prepatellar bursal swelling but there is a small purulent draining wound.  There is no knee effusion.  Collaterals and cruciates are stable.  There is no cellulitis.  Imaging: No results found.   Labs: Lab Results  Component Value Date   HGBA1C 7.5 (H) 10/08/2023   HGBA1C 7.3 (H) 07/02/2023   HGBA1C 7.1 (H) 03/25/2023     Lab Results  Component Value Date   ALBUMIN 4.5 10/08/2023   ALBUMIN 4.5 07/02/2023   ALBUMIN 4.6 03/25/2023    No results found for: "MG" Lab Results  Component Value Date   VD25OH 42 02/02/2013    No results found for: "PREALBUMIN"     Latest Ref Rng & Units 10/08/2023    8:35 AM 03/25/2023    8:21 AM 07/18/2022    8:09 AM  CBC EXTENDED  WBC 3.4 - 10.8 x10E3/uL 7.7  8.0  6.9   RBC 4.14 - 5.80 x10E6/uL 4.77  4.98  4.93   Hemoglobin 13.0 - 17.7 g/dL 19.1  47.8  29.5   HCT 37.5 - 51.0 % 41.8  43.3  43.6   Platelets 150 - 450 x10E3/uL 263  260  240   NEUT# 1.4 - 7.0 x10E3/uL 4.0  4.8  4.0   Lymph# 0.7 - 3.1 x10E3/uL 2.5  2.1  2.1      There is no height or weight on file to calculate BMI.  Orders:  Orders Placed This Encounter  Procedures   XR Knee 1-2 Views Right   Meds ordered this encounter  Medications   mupirocin ointment (BACTROBAN) 2 %    Sig: Apply 1 Application topically 2 (two) times daily. Apply to the affected area 2 times a day    Dispense:  22 g    Refill:  3     Procedures: No procedures performed  Clinical Data: No additional findings.  ROS:  All other systems  negative, except as noted in the HPI. Review of Systems  Objective: Vital Signs: There were no vitals taken for this visit.  Specialty Comments:  No specialty comments available.  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 03/02/2016   BMI 28.0-28.9,adult    Hyperlipidemia associated with type 2 diabetes mellitus (HCC) 12/19/2010   Hypertension associated with diabetes (HCC) 12/19/2010   Type II diabetes mellitus (HCC) 12/19/2010   Past Medical History:  Diagnosis Date   Diabetes mellitus    HTN (hypertension)    Hyperlipidemia    Overweight(278.02)    Squamous acanthoma of upper extremity     Family History  Problem Relation Age of Onset   Diabetes Mother    Heart disease Mother 94       CABG   Hyperlipidemia Mother    Hypertension Mother    Parkinsonism Father    Diabetes Sister    Hyperlipidemia Sister    Hypertension Sister    Diabetes Brother    Hyperlipidemia Brother    Hypertension Brother     Past Surgical History:   Procedure Laterality Date   None     squamous acanthoma of upper extremity     Social History   Occupational History   Occupation: Retired    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

## 2024-01-09 ENCOUNTER — Ambulatory Visit (INDEPENDENT_AMBULATORY_CARE_PROVIDER_SITE_OTHER): Admitting: Orthopedic Surgery

## 2024-01-09 DIAGNOSIS — S81001A Unspecified open wound, right knee, initial encounter: Secondary | ICD-10-CM

## 2024-01-09 DIAGNOSIS — M25561 Pain in right knee: Secondary | ICD-10-CM

## 2024-01-09 DIAGNOSIS — G8929 Other chronic pain: Secondary | ICD-10-CM | POA: Diagnosis not present

## 2024-01-10 ENCOUNTER — Telehealth: Payer: Self-pay | Admitting: Family Medicine

## 2024-01-10 NOTE — Telephone Encounter (Signed)
Left message for Philip Huang to return call. 

## 2024-01-10 NOTE — Telephone Encounter (Signed)
 Pt returning call to Guy, [phone]

## 2024-01-12 ENCOUNTER — Encounter: Payer: Self-pay | Admitting: Orthopedic Surgery

## 2024-01-12 NOTE — Progress Notes (Signed)
 Office Visit Note   Patient: Philip Huang           Date of Birth: [birth year]           MRN: [id number] Visit Date: 01/09/2024              Requested by: [name], Lucio Sabin, MD [address] [place],  [place] [address] PCP: [name], Lucio Sabin, MD  Chief Complaint  Patient presents with   Right Knee - Follow-up      HPI: Patient is a 73 year old gentleman who presents in follow-up for chronic draining bursal infection right knee.  Patient states that he has completed his antibiotics as well as topical antibiotic ointment without improvement in the drainage.  Assessment & Plan: Visit Diagnoses:  1. Chronic pain of right knee   2. Open knee wound, right, initial encounter     Plan: Will plan for surgical excision of the prepatellar bursa.  Will send tissue for cultures.  Risks and benefits were discussed including wound dehiscence need for additional surgery, recurrent infection.  Patient states he understands wished to proceed at this time.  Follow-Up Instructions: Return in about 4 weeks (around 02/06/2024).   Ortho Exam  Patient is alert, oriented, no adenopathy, well-dressed, normal affect, normal respiratory effort. Examination patient does not have bursal swelling but does have a ulcer with purulent drainage.  There is tenderness to palpation over the bursa.  There is no fluctuation.    Imaging: No results found. No images are attached to the encounter.  Labs: Lab Results  Component Value Date   HGBA1C 7.5 (H) 10/08/2023   HGBA1C 7.3 (H) 07/02/2023   HGBA1C 7.1 (H) 03/25/2023     Lab Results  Component Value Date   ALBUMIN 4.5 10/08/2023   ALBUMIN 4.5 07/02/2023   ALBUMIN 4.6 03/25/2023    No results found for: "MG" Lab Results  Component Value Date   VD25OH 42 02/02/2013    No results found for: "PREALBUMIN"    Latest Ref Rng & Units 10/08/2023    8:35 AM 03/25/2023    8:21 AM 07/18/2022    8:09 AM  CBC EXTENDED  WBC 3.4 - 10.8 x10E3/uL 7.7   8.0  6.9   RBC 4.14 - 5.80 x10E6/uL 4.77  4.98  4.93   Hemoglobin 13.0 - 17.7 g/dL 84.6  96.2  95.2   HCT 37.5 - 51.0 % 41.8  43.3  43.6   Platelets 150 - 450 x10E3/uL 263  260  240   NEUT# 1.4 - 7.0 x10E3/uL 4.0  4.8  4.0   Lymph# 0.7 - 3.1 x10E3/uL 2.5  2.1  2.1      There is no height or weight on file to calculate BMI.  Orders:  No orders of the defined types were placed in this encounter.  No orders of the defined types were placed in this encounter.    Procedures: No procedures performed  Clinical Data: No additional findings.  ROS:  All other systems negative, except as noted in the HPI. Review of Systems  Objective: Vital Signs: There were no vitals taken for this visit.  Specialty Comments:  No specialty comments available.  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 03/02/2016   BMI 28.0-28.9,adult    Hyperlipidemia associated with type 2 diabetes mellitus (HCC) 12/19/2010   Hypertension associated with diabetes (HCC) 12/19/2010   Type II diabetes  mellitus (HCC) 12/19/2010   Past Medical History:  Diagnosis Date   Diabetes mellitus    HTN (hypertension)    Hyperlipidemia    Overweight(278.02)    Squamous acanthoma of upper extremity     Family History  Problem Relation Age of Onset   Diabetes Mother    Heart disease Mother 8       CABG   Hyperlipidemia Mother    Hypertension Mother    Parkinsonism Father    Diabetes Sister    Hyperlipidemia Sister    Hypertension Sister    Diabetes Brother    Hyperlipidemia Brother    Hypertension Brother     Past Surgical History:  Procedure Laterality Date   None     squamous acanthoma of upper extremity     Social History   Occupational History   Occupation: Retired    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

## 2024-01-13 NOTE — Telephone Encounter (Signed)
 Should he stop Ozempic  before his knee surgery?  Pt had to cancel appt this week due to work schedule. Can still come in this week as long as it is in the early am. Appt made for 6/12 at 8:55am. Will ask about holding Ozempic  at the visit.

## 2024-01-15 ENCOUNTER — Ambulatory Visit: Admitting: Family Medicine

## 2024-01-16 ENCOUNTER — Ambulatory Visit (INDEPENDENT_AMBULATORY_CARE_PROVIDER_SITE_OTHER): Admitting: Family Medicine

## 2024-01-16 VITALS — BP 156/67 | HR 57 | Temp 97.6°F | Ht 69.0 in | Wt 178.4 lb

## 2024-01-16 DIAGNOSIS — I152 Hypertension secondary to endocrine disorders: Secondary | ICD-10-CM | POA: Diagnosis not present

## 2024-01-16 DIAGNOSIS — E1169 Type 2 diabetes mellitus with other specified complication: Secondary | ICD-10-CM | POA: Diagnosis not present

## 2024-01-16 DIAGNOSIS — E785 Hyperlipidemia, unspecified: Secondary | ICD-10-CM

## 2024-01-16 DIAGNOSIS — E1159 Type 2 diabetes mellitus with other circulatory complications: Secondary | ICD-10-CM

## 2024-01-16 LAB — BAYER DCA HB A1C WAIVED: HB A1C (BAYER DCA - WAIVED): 6.7 % — ABNORMAL HIGH (ref 4.8–5.6)

## 2024-01-16 MED ORDER — METFORMIN HCL 1000 MG PO TABS: 1000.0000 mg | ORAL_TABLET | Freq: Two times a day (BID) | ORAL | 3 refills | Status: DC

## 2024-01-16 MED ORDER — FENOFIBRATE 160 MG PO TABS: 160.0000 mg | ORAL_TABLET | Freq: Every day | ORAL | 3 refills | Status: DC

## 2024-01-16 MED ORDER — ROSUVASTATIN CALCIUM 40 MG PO TABS: 40.0000 mg | ORAL_TABLET | Freq: Every day | ORAL | 3 refills | Status: DC

## 2024-01-16 MED ORDER — GLIPIZIDE ER 10 MG PO TB24: 10.0000 mg | ORAL_TABLET | Freq: Every day | ORAL | 3 refills | Status: DC

## 2024-01-16 MED ORDER — LISINOPRIL-HYDROCHLOROTHIAZIDE 20-12.5 MG PO TABS
2.0000 | ORAL_TABLET | Freq: Every day | ORAL | 3 refills | Status: AC
Start: 2024-01-16 — End: ?

## 2024-01-16 NOTE — Progress Notes (Signed)
 BP (!) 156/67   Pulse (!) 57   Temp 97.6 F (36.4 C) (Temporal)   Ht 5' 9 (1.753 m)   Wt 178 lb 6.4 oz (80.9 kg)   SpO2 96%   BMI 26.35 kg/m    Subjective:   Patient ID: Philip Huang, male    DOB: 1951/05/18, 73 y.o.   MRN: [id number]  HPI: Philip Huang is a 73 y.o. male presenting on 01/16/2024 for Medical Management of Chronic Issues (3 month)   HPI Type 2 diabetes mellitus Patient comes in today for recheck of his diabetes. Patient has been currently taking Jardiance  and metformin  and glipizide  and Ozempic . Patient is currently on an ACE inhibitor/ARB. Patient has seen an ophthalmologist this year. Patient denies any new issues with their feet. The symptom started onset as an adult hypertension and hyperlipidemia ARE RELATED TO DM   Hypertension Patient is currently on lisinopril -hydrochlorothiazide , and their blood pressure today is 156/67. Patient denies any lightheadedness or dizziness. Patient denies headaches, blurred vision, chest pains, shortness of breath, or weakness. Denies any side effects from medication and is content with current medication.   Hyperlipidemia Patient is coming in for recheck of his hyperlipidemia. The patient is currently taking fenofibrate  and fish oils and Crestor . They deny any issues with myalgias or history of liver damage from it. They deny any focal numbness or weakness or chest pain.   Relevant past medical, surgical, family and social history reviewed and updated as indicated. Interim medical history since our last visit reviewed. Allergies and medications reviewed and updated.  Review of Systems  Constitutional:  Negative for chills and fever.  Eyes:  Negative for visual disturbance.  Respiratory:  Negative for shortness of breath and wheezing.   Cardiovascular:  Negative for chest pain and leg swelling.  Musculoskeletal:  Negative for back pain and gait problem.  Skin:  Negative for rash.  Neurological:  Negative for  dizziness and light-headedness.  All other systems reviewed and are negative.   Per HPI unless specifically indicated above   Allergies as of 01/16/2024       Reactions   Sulfa  Antibiotics Rash        Medication List        Accurate as of January 16, 2024  9:10 AM. If you have any questions, ask your nurse or doctor.          STOP taking these medications    cephALEXin  500 MG capsule Commonly known as: KEFLEX  Stopped by: Lucio Sabin Tashonna Descoteaux   doxycycline  100 MG tablet Commonly known as: VIBRA -TABS Stopped by: Lucio Sabin Jenean Escandon       TAKE these medications    Accu-Chek Aviva Plus test strip Generic drug: glucose blood TEST BLOOD SUGAR TWICE DAILY Dx E11.9   Accu-Chek Softclix Lancets lancets TEST BLOOD SUGAR TWICE DAILY Dx E11.9   aspirin 81 MG tablet Take 81 mg by mouth daily.   DropSafe Alcohol  Prep 70 % Pads TEST BLOOD SUGAR TWICE DAILY Dx E11.9   empagliflozin  25 MG Tabs tablet Commonly known as: Jardiance  Take 1 tablet (25 mg total) by mouth daily before breakfast.   fenofibrate  160 MG tablet Take 1 tablet (160 mg total) by mouth daily.   fish oil-omega-3 fatty acids 1000 MG capsule Take 1 g by mouth daily.   glipiZIDE  10 MG 24 hr tablet Commonly known as: GLUCOTROL  XL Take 1 tablet (10 mg total) by mouth daily with breakfast.   lisinopril -hydrochlorothiazide  20-12.5 MG tablet Commonly known  as: ZESTORETIC  Take 2 tablets by mouth daily.   MENS 50+ ADVANCED PO Take by mouth.   metFORMIN  1000 MG tablet Commonly known as: GLUCOPHAGE  Take 1 tablet (1,000 mg total) by mouth 2 (two) times daily with a meal.   mupirocin  ointment 2 % Commonly known as: BACTROBAN  Apply 1 Application topically 2 (two) times daily. Apply to the affected area 2 times a day   nitroGLYCERIN  0.4 MG SL tablet Commonly known as: NITROSTAT  Place 1 tablet (0.4 mg total) under the tongue every 5 (five) minutes as needed.   rosuvastatin  40 MG tablet Commonly known as:  CRESTOR  Take 1 tablet (40 mg total) by mouth daily.   Semaglutide  (2 MG/DOSE) 8 MG/3ML Sopn Inject 2 mg as directed once a week.   Vitamin D  50 MCG (2000 UT) tablet Take 2,000 Units by mouth daily.         Objective:   BP (!) 156/67   Pulse (!) 57   Temp 97.6 F (36.4 C) (Temporal)   Ht 5' 9 (1.753 m)   Wt 178 lb 6.4 oz (80.9 kg)   SpO2 96%   BMI 26.35 kg/m   Wt Readings from Last 3 Encounters:  01/16/24 178 lb 6.4 oz (80.9 kg)  11/28/23 178 lb (80.7 kg)  10/11/23 181 lb (82.1 kg)    Physical Exam Vitals and nursing note reviewed.  Constitutional:      General: He is not in acute distress.    Appearance: He is well-developed. He is not diaphoretic.   Eyes:     General: No scleral icterus.    Conjunctiva/sclera: Conjunctivae normal.   Neck:     Thyroid : No thyromegaly.   Cardiovascular:     Rate and Rhythm: Normal rate and regular rhythm.     Heart sounds: Normal heart sounds. No murmur heard. Pulmonary:     Effort: Pulmonary effort is normal. No respiratory distress.     Breath sounds: Normal breath sounds. No wheezing.   Musculoskeletal:        General: No swelling.     Cervical back: Neck supple.  Lymphadenopathy:     Cervical: No cervical adenopathy.   Skin:    General: Skin is warm and dry.     Findings: No rash.   Neurological:     Mental Status: He is alert and oriented to person, place, and time.     Coordination: Coordination normal.   Psychiatric:        Behavior: Behavior normal.       Assessment & Plan:   Problem List Items Addressed This Visit       Cardiovascular and Mediastinum   Hypertension associated with diabetes (HCC)   Relevant Medications   fenofibrate  160 MG tablet   glipiZIDE  (GLUCOTROL  XL) 10 MG 24 hr tablet   lisinopril -hydrochlorothiazide  (ZESTORETIC ) 20-12.5 MG tablet   metFORMIN  (GLUCOPHAGE ) 1000 MG tablet   rosuvastatin  (CRESTOR ) 40 MG tablet     Endocrine   Type II diabetes mellitus (HCC) - Primary  (Chronic)   Relevant Medications   glipiZIDE  (GLUCOTROL  XL) 10 MG 24 hr tablet   lisinopril -hydrochlorothiazide  (ZESTORETIC ) 20-12.5 MG tablet   metFORMIN  (GLUCOPHAGE ) 1000 MG tablet   rosuvastatin  (CRESTOR ) 40 MG tablet   Other Relevant Orders   Bayer DCA Hb A1c Waived   Hyperlipidemia associated with type 2 diabetes mellitus (HCC)   Relevant Medications   fenofibrate  160 MG tablet   glipiZIDE  (GLUCOTROL  XL) 10 MG 24 hr tablet   lisinopril -hydrochlorothiazide  (ZESTORETIC ) 20-12.5  MG tablet   metFORMIN  (GLUCOPHAGE ) 1000 MG tablet   rosuvastatin  (CRESTOR ) 40 MG tablet    Repeat blood pressure was 131/66, A1c was 6.7.  Continue to monitor blood pressure and blood sugar at home.  He is having a knee surgery in 13 days and they might do local anesthesia to remove the bursitis that could be infected or have a wound in that.  No changes Follow up plan: Return in about 3 months (around 04/17/2024), or if symptoms worsen or fail to improve, for Diabetes and hypertension and cholesterol.  Counseling provided for all of the vaccine components Orders Placed This Encounter  Procedures   Bayer DCA Hb A1c Waived    Jolyne Needs, MD [facility] 01/16/2024, 9:10 AM

## 2024-01-17 ENCOUNTER — Telehealth: Payer: Self-pay

## 2024-01-17 ENCOUNTER — Telehealth: Payer: Self-pay | Admitting: Orthopedic Surgery

## 2024-01-17 NOTE — Telephone Encounter (Signed)
 Patient called and need a note showing that he will be out for surgery. [phone] Put it in the mychart.

## 2024-01-18 ENCOUNTER — Other Ambulatory Visit: Payer: Self-pay | Admitting: Family Medicine

## 2024-01-18 DIAGNOSIS — E1169 Type 2 diabetes mellitus with other specified complication: Secondary | ICD-10-CM

## 2024-01-18 DIAGNOSIS — I152 Hypertension secondary to endocrine disorders: Secondary | ICD-10-CM

## 2024-01-21 ENCOUNTER — Other Ambulatory Visit: Payer: Self-pay

## 2024-01-21 NOTE — Telephone Encounter (Signed)
 Note has been written pt is sch for surgery on 01/29/2024 and that he will be out of work for 2 weeks for post op care.

## 2024-01-22 ENCOUNTER — Ambulatory Visit: Payer: Medicare HMO

## 2024-01-22 VITALS — BP 132/64 | Ht 69.0 in | Wt 178.0 lb

## 2024-01-22 DIAGNOSIS — Z0000 Encounter for general adult medical examination without abnormal findings: Secondary | ICD-10-CM | POA: Diagnosis not present

## 2024-01-22 NOTE — Telephone Encounter (Signed)
 Rec'd a second fax on 01/17/24:    Reached out to [facility] to confirm patient is on the 2mg  dose pens.   Per rep: Ozempic  1mg  dose pens delivered to office 11/28/23 due to auto refill not being turned off on their end (this seems to be an issue on their end, not ours). The 2mg  dose pens delivered the day before on 11/27/23.   Rep is sending a return label for the 1mg  dose pens since patient no longer takes that strength and that is a lot of medicine. Says patient can return what he has to the office to be shipped back.   Im not sure how he (or you) would want to go about this after all the issues getting the dose increased a few months ago (his wife is also not very fond of me due to this!!).   Next shipment processes 02/15/24

## 2024-01-22 NOTE — Progress Notes (Signed)
 Subjective:   Philip Huang is a 73 y.o. who presents for a Medicare Wellness preventive visit.  As a reminder, Annual Wellness Visits don't include a physical exam, and some assessments may be limited, especially if this visit is performed virtually. We may recommend an in-person follow-up visit with your provider if needed.  Visit Complete: Virtual I connected with  ODAS OZER on 01/22/24 by a audio enabled telemedicine application and verified that I am speaking with the correct person using two identifiers.  Patient Location: Home  Provider Location: Home Office  I discussed the limitations of evaluation and management by telemedicine. The patient expressed understanding and agreed to proceed.  Vital Signs: Because this visit was a virtual/telehealth visit, some criteria may be missing or patient reported. Any vitals not documented were not able to be obtained and vitals that have been documented are patient reported.  VideoDeclined- This patient declined Librarian, academic. Therefore the visit was completed with audio only.  Persons Participating in Visit: Patient.  AWV Questionnaire: No: Patient Medicare AWV questionnaire was not completed prior to this visit.  Cardiac Risk Factors include: advanced age (>57men, >62 women);diabetes mellitus;dyslipidemia;hypertension;male gender     Objective:    Today's Vitals   01/22/24 0806  BP: 132/64  Weight: 178 lb (80.7 kg)  Height: 5' 9 (1.753 m)   Body mass index is 26.29 kg/m.     01/22/2024    8:14 AM 01/21/2023    8:23 AM 02/28/2022    2:48 PM 01/15/2022    8:22 AM 12/27/2020    8:22 AM 05/05/2019    1:37 PM 04/11/2018    1:06 PM  Advanced Directives  Does Patient Have a Medical Advance Directive? Yes Yes Yes Yes Yes Yes Yes   Type of Estate agent of [employer] Power of [place];Living will  Healthcare Power of [place];Living will Healthcare Power of  [place];Living will Healthcare Power of [employer] Power of Attorney  Does patient want to make changes to medical advance directive?     No - Guardian declined No - Patient declined   Copy of Healthcare Power of Attorney in Chart? No - copy requested No - copy requested  No - copy requested No - copy requested No - copy requested No - copy requested   Would patient like information on creating a medical advance directive?     No - Patient declined       Data saved with a previous flowsheet row definition    Current Medications (verified) Outpatient Encounter Medications as of 01/22/2024  Medication Sig   Accu-Chek Softclix Lancets lancets TEST BLOOD SUGAR TWICE DAILY Dx E11.9   Alcohol  Swabs (DROPSAFE ALCOHOL  PREP) 70 % PADS TEST BLOOD SUGAR TWICE DAILY Dx E11.9   aspirin 81 MG tablet Take 81 mg by mouth daily.   Cholecalciferol (VITAMIN D ) 2000 UNITS tablet Take 2,000 Units by mouth daily.   empagliflozin  (JARDIANCE ) 25 MG TABS tablet Take 1 tablet (25 mg total) by mouth daily before breakfast.   fenofibrate  160 MG tablet TAKE 1 TABLET EVERY DAY   fish oil-omega-3 fatty acids 1000 MG capsule Take 1 g by mouth daily.   glipiZIDE  (GLUCOTROL  XL) 10 MG 24 hr tablet TAKE 1 TABLET EVERY DAY WITH BREAKFAST   glucose blood (ACCU-CHEK AVIVA PLUS) test strip TEST BLOOD SUGAR TWICE DAILY Dx E11.9   lisinopril -hydrochlorothiazide  (ZESTORETIC ) 20-12.5 MG tablet TAKE 2 TABLETS EVERY DAY   metFORMIN  (GLUCOPHAGE ) 1000 MG tablet  TAKE 1 TABLET TWICE DAILY WITH MEALS   Multiple Vitamins-Minerals (MENS 50+ ADVANCED PO) Take by mouth.   mupirocin  ointment (BACTROBAN ) 2 % Apply 1 Application topically 2 (two) times daily. Apply to the affected area 2 times a day   nitroGLYCERIN  (NITROSTAT ) 0.4 MG SL tablet Place 1 tablet (0.4 mg total) under the tongue every 5 (five) minutes as needed.   rosuvastatin  (CRESTOR ) 40 MG tablet Take 1 tablet (40 mg total) by mouth daily.   Semaglutide , 2 MG/DOSE, 8 MG/3ML  SOPN Inject 2 mg as directed once a week.   No facility-administered encounter medications on file as of 01/22/2024.    Allergies (verified) Sulfa  antibiotics   History: Past Medical History:  Diagnosis Date   Diabetes mellitus    HTN (hypertension)    Hyperlipidemia    Overweight(278.02)    Squamous acanthoma of upper extremity    Past Surgical History:  Procedure Laterality Date   None     squamous acanthoma of upper extremity     Family History  Problem Relation Age of Onset   Diabetes Mother    Heart disease Mother 78       CABG   Hyperlipidemia Mother    Hypertension Mother    Parkinsonism Father    Diabetes Sister    Hyperlipidemia Sister    Hypertension Sister    Diabetes Brother    Hyperlipidemia Brother    Hypertension Brother    Social History   Socioeconomic History   Marital status: Married    Spouse name: Philip Huang   Number of children: 2   Years of education: 12   Highest education level: 12th grade  Occupational History   Occupation: Retired    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  Other Topics Concern   Not on file  Social History Narrative   Not on file   Social Drivers of Health   Financial Resource Strain: Low Risk  (01/22/2024)   Overall Financial Resource Strain (CARDIA)    Difficulty of Paying Living Expenses: Not hard at all  Food Insecurity: No Food Insecurity (01/22/2024)   Hunger Vital Sign    Worried About Running Out of Food in the Last Year: Never true    Ran Out of Food in the Last Year: Never true  Transportation Needs: No Transportation Needs (01/22/2024)   PRAPARE - Administrator, Civil Service  (Medical): No    Lack of Transportation (Non-Medical): No  Physical Activity: Sufficiently Active (01/22/2024)   Exercise Vital Sign    Days of Exercise per Week: 7 days    Minutes of Exercise per Session: 30 min  Recent Concern: Physical Activity - Insufficiently Active (01/16/2024)   Exercise Vital Sign    Days of Exercise per Week: 4 days    Minutes of Exercise per Session: 30 min  Stress: No Stress Concern Present (01/22/2024)   [employer] of Occupational Health - Occupational Stress Questionnaire    Feeling of Stress: Not at all  Social Connections: Moderately Isolated (01/22/2024)   Social Connection and Isolation Panel  Frequency of Communication with Friends and Family: Three times a week    Frequency of Social Gatherings with Friends and Family: Three times a week    Attends Religious Services: Never    Active Member of Clubs or Organizations: No    Attends Banker Meetings: Never    Marital Status: Married    Tobacco Counseling Counseling given: Yes    Clinical Intake:  Pre-visit preparation completed: Yes  Pain : No/denies pain     BMI - recorded: 26.29 Nutritional Status: BMI 25 -29 Overweight Nutritional Risks: None Diabetes: Yes CBG done?: No (94 per pt this morning)  Lab Results  Component Value Date   HGBA1C 6.7 (H) 01/16/2024   HGBA1C 7.5 (H) 10/08/2023   HGBA1C 7.3 (H) 07/02/2023     How often do you need to have someone help you when you read instructions, pamphlets, or other written materials from your doctor or pharmacy?: 1 - Never  Interpreter Needed?: No  Information entered by :: alia t/cma   Activities of Daily Living     01/22/2024    8:12 AM  In your present state of health, do you have any difficulty performing the following activities:  Hearing? 0  Vision? 0  Difficulty concentrating or making decisions? 0  Walking or climbing stairs? 0  Dressing or bathing? 0  Doing errands, shopping? 0  Preparing Food  and eating ? N  Using the Toilet? N  In the past six months, have you accidently leaked urine? N  Do you have problems with loss of bowel control? N  Managing your Medications? N  Managing your Finances? N  Housekeeping or managing your Housekeeping? N    Patient Care Team: [name], Lucio Sabin, MD as PCP - General (Family Medicine) Delilah Fend, [facility] (Pharmacist)  I have updated your Care Teams any recent Medical Services you may have received from other providers in the past year.     Assessment:   This is a routine wellness examination for Philip Huang.  Hearing/Vision screen Hearing Screening - Comments:: Pt denies hearing dif Vision Screening - Comments:: Pt denies vision dif/pt goes to [facility] [address] in [place], [place]/last ov 6/24, suggest to make an apt for eye appt   Goals Addressed             This Visit's Progress    patient stated       Pt wants to try keep A1C down and be healthy       Depression Screen     01/22/2024    8:16 AM 11/28/2023    8:30 AM 10/11/2023    8:10 AM 07/10/2023    3:31 PM 04/01/2023    8:08 AM 01/21/2023    8:22 AM 11/16/2022    3:19 PM  PHQ 2/9 Scores  PHQ - 2 Score 0 0 0 0 0 0 0  PHQ- 9 Score 0   0 0  0    Fall Risk     01/22/2024    8:08 AM 11/28/2023    8:30 AM 10/11/2023    8:09 AM 07/10/2023    3:31 PM 04/01/2023    8:08 AM  Fall Risk   Falls in the past year? 0 0 0 0 0  Number falls in past yr: 0 0     Injury with Fall? 0 0     Risk for fall due to : No Fall Risks No Fall Risks     Follow up Falls evaluation completed  Falls evaluation completed       MEDICARE RISK AT HOME:  Medicare Risk at Home Any stairs in or around the home?: No If so, are there any without handrails?: No Home free of loose throw rugs in walkways, pet beds, electrical cords, etc?: Yes Adequate lighting in your home to reduce risk of falls?: Yes Life alert?: No Use of a cane, walker or w/c?: No Grab bars in the bathroom?: No Shower chair or bench in  shower?: No Elevated toilet seat or a handicapped toilet?: No  TIMED UP AND GO:  Was the test performed?  no  Cognitive Function: 6CIT completed    04/11/2018    1:16 PM 04/11/2018    1:09 PM  MMSE - Mini Mental State Exam  Orientation to time 4 4  Orientation to Place 5 3  Registration 3 3  Attention/ Calculation 4 4  Recall 2 2  Language- name 2 objects 2 2  Language- repeat 1 1  Language- follow 3 step command 3 3  Language- read & follow direction 1 1  Write a sentence 1 1  Copy design 1 1  Total score 27 25        01/22/2024    8:17 AM 01/21/2023    8:23 AM 01/15/2022    8:23 AM 12/27/2020    8:25 AM 05/05/2019    1:41 PM  6CIT Screen  What Year? 0 points 0 points 0 points 0 points 0 points  What month? 0 points 0 points 0 points 0 points 0 points  What time? 0 points 0 points 0 points 0 points 0 points  Count back from 20 0 points 0 points 0 points 0 points 0 points  Months in reverse 0 points 0 points 4 points 0 points 4 points  Repeat phrase 0 points 0 points 0 points 0 points 0 points  Total Score 0 points 0 points 4 points 0 points 4 points    Immunizations Immunization History  Administered Date(s) Administered   Fluad Quad(high Dose 65+) 04/29/2019, 05/20/2020, 06/15/2021, 04/20/2022   Influenza, High Dose Seasonal PF 04/27/2018   Influenza,inj,Quad PF,6+ Mos 04/30/2015, 04/20/2017   Influenza-Unspecified 05/20/2016   PFIZER(Purple Top)SARS-COV-2 Vaccination 10/07/2019, 10/28/2019   Pneumococcal Conjugate-13 08/26/2017, 08/26/2017   Pneumococcal Polysaccharide-23 05/06/2001, 05/06/2001, 05/20/2020   Td 08/06/2005, 06/29/2013   Tdap 08/21/2019   Zoster Recombinant(Shingrix ) 09/21/2021, 12/28/2021   Zoster, Live 10/28/2013    Screening Tests Health Maintenance  Topic Date Due   COVID-19 Vaccine (3 - Pfizer risk series) 02/01/2024 (Originally 11/25/2019)   OPHTHALMOLOGY EXAM  01/24/2024   INFLUENZA VACCINE  03/06/2024   Diabetic kidney evaluation -  Urine ACR  03/31/2024   FOOT EXAM  03/31/2024   HEMOGLOBIN A1C  04/17/2024   Diabetic kidney evaluation - eGFR measurement  10/07/2024   Medicare Annual Wellness (AWV)  01/21/2025   Colonoscopy  05/17/2025   DTaP/Tdap/Td (4 - Td or Tdap) 08/20/2029   Pneumococcal Vaccine: 50+ Years  Completed   Hepatitis C Screening  Completed   Zoster Vaccines- Shingrix   Completed   HPV VACCINES  Aged Out   Meningococcal B Vaccine  Aged Out   COLON CANCER SCREENING ANNUAL FOBT  Discontinued    Health Maintenance  There are no preventive care reminders to display for this patient.  Health Maintenance Items Addressed: See Nurse Notes at the end of this note  Additional Screening:  Vision Screening: Recommended annual ophthalmology exams for early detection of glaucoma and other disorders of the  eye. Would you like a referral to an eye doctor? No    Dental Screening: Recommended annual dental exams for proper oral hygiene  Community Resource Referral / Chronic Care Management: CRR required this visit?  No   CCM required this visit?  No   Plan:    I have personally reviewed and noted the following in the patient's chart:   Medical and social history Use of alcohol , tobacco or illicit drugs  Current medications and supplements including opioid prescriptions. Patient is not currently taking opioid prescriptions. Functional ability and status Nutritional status Physical activity Advanced directives List of other physicians Hospitalizations, surgeries, and ER visits in previous 12 months Vitals Screenings to include cognitive, depression, and falls Referrals and appointments  In addition, I have reviewed and discussed with patient certain preventive protocols, quality metrics, and best practice recommendations. A written personalized care plan for preventive services as well as general preventive health recommendations were provided to patient.   Michaelle Adolphus, CMA   01/22/2024    After Visit Summary: (MyChart) Due to this being a telephonic visit, the after visit summary with patients personalized plan was offered to patient via MyChart   Notes: Nothing significant to report at this time.

## 2024-01-22 NOTE — Patient Instructions (Signed)
 Philip Huang , Thank you for taking time out of your busy schedule to complete your Annual Wellness Visit with me. I enjoyed our conversation and look forward to speaking with you again next year. I, as well as your care team,  appreciate your ongoing commitment to your health goals. Please review the following plan we discussed and let me know if I can assist you in the future. Your Game plan/ To Do List    Follow up Visits: Next Medicare AWV with our clinical staff: 01/22/25 at 8:00a.m.   Have you seen your provider in the last 6 months (3 months if uncontrolled diabetes)? Yes Next Office Visit with your provider: 04/17/24 at 7:55a.m.  Clinician Recommendations:  Aim for 30 minutes of exercise or brisk walking, 6-8 glasses of water, and 5 servings of fruits and vegetables each day.       This is a list of the screening recommended for you and due dates:  Health Maintenance  Topic Date Due   COVID-19 Vaccine (3 - Pfizer risk series) 02/01/2024*   Eye exam for diabetics  01/24/2024   Flu Shot  03/06/2024   Yearly kidney health urinalysis for diabetes  03/31/2024   Complete foot exam   03/31/2024   Hemoglobin A1C  04/17/2024   Yearly kidney function blood test for diabetes  10/07/2024   Medicare Annual Wellness Visit  01/21/2025   Colon Cancer Screening  05/17/2025   DTaP/Tdap/Td vaccine (4 - Td or Tdap) 08/20/2029   Pneumococcal Vaccine for age over 29  Completed   Hepatitis C Screening  Completed   Zoster (Shingles) Vaccine  Completed   HPV Vaccine  Aged Out   Meningitis B Vaccine  Aged Out   Stool Blood Test  Discontinued  *Topic was postponed. The date shown is not the original due date.    Advanced directives: (Copy Requested) Please bring a copy of your health care power of attorney and living will to the office to be added to your chart at your convenience. You can mail to [facility] [address] [place], [place] [address] or email to  [email or web address] Advance Care Planning is important because it:  [x]  Makes sure you receive the medical care that is consistent with your values, goals, and preferences  [x]  It provides guidance to your family and loved ones and reduces their decisional burden about whether or not they are making the right decisions based on your wishes.  Follow the link provided in your after visit summary or read over the paperwork we have mailed to you to help you started getting your Advance Directives in place. If you need assistance in completing these, please reach out to us  so that we can help you!  See attachments for Preventive Care and Fall Prevention Tips.

## 2024-01-26 ENCOUNTER — Other Ambulatory Visit: Payer: Self-pay | Admitting: Family Medicine

## 2024-01-26 DIAGNOSIS — E1169 Type 2 diabetes mellitus with other specified complication: Secondary | ICD-10-CM

## 2024-01-27 ENCOUNTER — Encounter (HOSPITAL_COMMUNITY): Payer: Self-pay | Admitting: Orthopedic Surgery

## 2024-01-28 ENCOUNTER — Encounter (HOSPITAL_COMMUNITY): Payer: Self-pay | Admitting: Orthopedic Surgery

## 2024-01-28 ENCOUNTER — Other Ambulatory Visit: Payer: Self-pay

## 2024-01-28 NOTE — Anesthesia Preprocedure Evaluation (Signed)
 Anesthesia Evaluation  Patient identified by MRN, date of birth, ID band Patient awake    Reviewed: Allergy & Precautions, NPO status , Patient's Chart, lab work & pertinent test results  Airway Mallampati: II  TM Distance: >3 FB Neck ROM: Full    Dental  (+) Chipped,    Pulmonary former smoker   Pulmonary exam normal        Cardiovascular hypertension, Pt. on medications Normal cardiovascular exam     Neuro/Psych negative neurological ROS  negative psych ROS   GI/Hepatic negative GI ROS, Neg liver ROS,,,  Endo/Other  diabetes, Oral Hypoglycemic Agents  Patient on GLP-1 Agonist  Renal/GU negative Renal ROS     Musculoskeletal negative musculoskeletal ROS (+)    Abdominal   Peds  Hematology negative hematology ROS (+)   Anesthesia Other Findings Infected Right Knee Bursa  Reproductive/Obstetrics                             Anesthesia Physical Anesthesia Plan  ASA: 3  Anesthesia Plan: General   Post-op Pain Management:    Induction: Intravenous  PONV Risk Score and Plan: 2 and Ondansetron , Dexamethasone and Treatment may vary due to age or medical condition  Airway Management Planned: LMA  Additional Equipment:   Intra-op Plan:   Post-operative Plan: Extubation in OR  Informed Consent: I have reviewed the patients History and Physical, chart, labs and discussed the procedure including the risks, benefits and alternatives for the proposed anesthesia with the patient or authorized representative who has indicated his/her understanding and acceptance.     Dental advisory given  Plan Discussed with: CRNA  Anesthesia Plan Comments: (Reviewed. PMH of  Diabetes mellitus (A1c 6.7), HTN (hypertension), Hyperlipidemia, Overweight(278.02), and Squamous acanthoma of upper extremity. )        Anesthesia Quick Evaluation

## 2024-01-28 NOTE — Progress Notes (Signed)
 PCP - Dr Fonda [name] Cardiologist - none  Chest x-ray - n/a EKG - DOS Stress Test - 02/22/11 ECHO - n/a Cardiac Cath - n/a  ICD Pacemaker/Loop - n/a  Sleep Study -  n/a CPAP - none  Diabetes Type 2 Hold Jardiance  for 72 hours prior to procedure.  Last dose was on 01/28/24 (SDW call)  Do not take Glipizide  or Metformin  on the morning of surgery.  Do not take Semaglutide  for 7 days prior to procedure.  Last dose was on 01/19/24.  If your blood sugar is less than 70 mg/dL, you will need to treat for low blood sugar: Treat a low blood sugar (less than 70 mg/dL) with  cup of clear juice (cranberry or apple), 4 glucose tablets, OR glucose gel. Recheck blood sugar in 15 minutes after treatment (to make sure it is greater than 70 mg/dL). If your blood sugar is not greater than 70 mg/dL on recheck, call [phone] for further instructions.  Blood Thinner Instructions:  n/a  Aspirin Instructions: Last dose was on 01/23/24.  ERAS - clear liquids til 7:30 AM DOS.  Anesthesia review: Yes  STOP now taking any Aspirin (unless otherwise instructed by your surgeon), Aleve, Naproxen, Ibuprofen , Motrin , Advil , Goody's, BC's, all herbal medications, fish oil, and all vitamins.   Coronavirus Screening Does the patient have any of the following symptoms:  Cough yes/no: No Fever (>100.68F)  yes/no: No Runny nose yes/no: No Sore throat yes/no: No Difficulty breathing/shortness of breath  yes/no: No  Has the patient traveled in the last 14 days and where? yes/no: No  Patient's wife Philip Huang verbalized understanding of instructions that were given via phone.

## 2024-01-28 NOTE — H&P (Signed)
 Philip Huang is an 73 y.o. male.   Chief Complaint: right knee draining bursal infection  HPI:  Patient is a 73 year old gentleman who presents in follow-up for chronic draining bursal infection right knee. Patient states that he has completed his antibiotics as well as topical antibiotic ointment without improvement in the drainage.   Patient states he started with ulceration about a year ago when he was building a deck. Patient states that he has had a pin size hole over the patella with purulent drainage. Patient has been on Keflex  500 mg twice a day for 7 days.    Past Medical History:  Diagnosis Date   Diabetes mellitus    HTN (hypertension)    Hyperlipidemia    Overweight(278.02)    Squamous acanthoma of upper extremity     Past Surgical History:  Procedure Laterality Date   None     squamous acanthoma of upper extremity      Family History  Problem Relation Age of Onset   Diabetes Mother    Heart disease Mother 44       CABG   Hyperlipidemia Mother    Hypertension Mother    Parkinsonism Father    Diabetes Sister    Hyperlipidemia Sister    Hypertension Sister    Diabetes Brother    Hyperlipidemia Brother    Hypertension Brother    Social History:  reports that he quit smoking about 61 years ago. His smoking use included cigarettes. He has never used smokeless tobacco. He reports that he does not drink alcohol  and does not use drugs.  Allergies:  Allergies  Allergen Reactions   Sulfa  Antibiotics Rash    No medications prior to admission.    No results found for this or any previous visit (from the past 48 hours). No results found.  Review of Systems  All other systems reviewed and are negative.   There were no vitals taken for this visit. Physical Exam  Patient is alert, oriented, no adenopathy, well-dressed, normal affect, normal respiratory effort. Examination patient has no prepatellar bursal swelling but there is a small purulent draining  wound.  There is no knee effusion.  Collaterals and cruciates are stable.  There is no cellulitis.     Assessment/Plan  Chronic right knee pain with open draining wound prepatellar bursa.   Plan for surgical excision of the prepatellar bursa. Will send tissue for cultures. Risks and benefits were discussed including wound dehiscence need for additional surgery, recurrent infection. Patient states he understands wished to proceed at this time.     Maurilio Deland Collet, PA-C 01/28/2024, 12:31 PM

## 2024-01-29 ENCOUNTER — Other Ambulatory Visit: Payer: Self-pay

## 2024-01-29 ENCOUNTER — Ambulatory Visit (HOSPITAL_COMMUNITY)
Admission: RE | Admit: 2024-01-29 | Discharge: 2024-01-29 | Disposition: A | Discharge status: Home / Self Care | Attending: Orthopedic Surgery | Admitting: Orthopedic Surgery

## 2024-01-29 ENCOUNTER — Other Ambulatory Visit (HOSPITAL_COMMUNITY): Payer: Self-pay

## 2024-01-29 ENCOUNTER — Encounter (HOSPITAL_COMMUNITY): Payer: Self-pay | Admitting: Orthopedic Surgery

## 2024-01-29 ENCOUNTER — Encounter (HOSPITAL_COMMUNITY)
Admission: RE | Disposition: A | Discharge status: Home / Self Care | Payer: Self-pay | Source: Home / Self Care | Attending: Orthopedic Surgery

## 2024-01-29 ENCOUNTER — Ambulatory Visit (HOSPITAL_COMMUNITY): Payer: Self-pay | Admitting: Medical

## 2024-01-29 ENCOUNTER — Ambulatory Visit (HOSPITAL_BASED_OUTPATIENT_CLINIC_OR_DEPARTMENT_OTHER): Payer: Self-pay | Admitting: Medical

## 2024-01-29 DIAGNOSIS — Z87891 Personal history of nicotine dependence: Secondary | ICD-10-CM | POA: Insufficient documentation

## 2024-01-29 DIAGNOSIS — I10 Essential (primary) hypertension: Secondary | ICD-10-CM

## 2024-01-29 DIAGNOSIS — Z79899 Other long term (current) drug therapy: Secondary | ICD-10-CM | POA: Insufficient documentation

## 2024-01-29 DIAGNOSIS — M71161 Other infective bursitis, right knee: Secondary | ICD-10-CM | POA: Insufficient documentation

## 2024-01-29 DIAGNOSIS — M7041 Prepatellar bursitis, right knee: Secondary | ICD-10-CM | POA: Diagnosis not present

## 2024-01-29 DIAGNOSIS — S81001A Unspecified open wound, right knee, initial encounter: Secondary | ICD-10-CM | POA: Diagnosis not present

## 2024-01-29 DIAGNOSIS — E119 Type 2 diabetes mellitus without complications: Secondary | ICD-10-CM | POA: Diagnosis not present

## 2024-01-29 DIAGNOSIS — Z7984 Long term (current) use of oral hypoglycemic drugs: Secondary | ICD-10-CM | POA: Diagnosis not present

## 2024-01-29 DIAGNOSIS — S81001D Unspecified open wound, right knee, subsequent encounter: Secondary | ICD-10-CM

## 2024-01-29 DIAGNOSIS — Z419 Encounter for procedure for purposes other than remedying health state, unspecified: Secondary | ICD-10-CM

## 2024-01-29 DIAGNOSIS — Z7985 Long-term (current) use of injectable non-insulin antidiabetic drugs: Secondary | ICD-10-CM | POA: Diagnosis not present

## 2024-01-29 HISTORY — PX: KNEE BURSECTOMY: SHX5882

## 2024-01-29 HISTORY — DX: Personal history of urinary calculi: Z87.442

## 2024-01-29 HISTORY — DX: Gastro-esophageal reflux disease without esophagitis: K21.9

## 2024-01-29 HISTORY — DX: Malignant (primary) neoplasm, unspecified: C80.1

## 2024-01-29 LAB — COMPREHENSIVE METABOLIC PANEL WITH GFR
ALT: 28 U/L (ref 0–44)
AST: 32 U/L (ref 15–41)
Albumin: 3.7 g/dL (ref 3.5–5.0)
Alkaline Phosphatase: 30 U/L — ABNORMAL LOW (ref 38–126)
Anion gap: 9 (ref 5–15)
BUN: 19 mg/dL (ref 8–23)
CO2: 24 mmol/L (ref 22–32)
Calcium: 8.7 mg/dL — ABNORMAL LOW (ref 8.9–10.3)
Chloride: 105 mmol/L (ref 98–111)
Creatinine, Ser: 1.08 mg/dL (ref 0.61–1.24)
GFR, Estimated: >60 mL/min (ref >60)
Glucose, Bld: 102 mg/dL — ABNORMAL HIGH (ref 70–99)
Potassium: 3.6 mmol/L (ref 3.5–5.1)
Sodium: 138 mmol/L (ref 135–145)
Total Bilirubin: 0.7 mg/dL (ref 0.0–1.2)
Total Protein: 6.8 g/dL (ref 6.5–8.1)

## 2024-01-29 LAB — CBC WITH DIFFERENTIAL/PLATELET
Abs Immature Granulocytes: 0.02 10*3/uL (ref 0.00–0.07)
Basophils Absolute: 0.1 10*3/uL (ref 0.0–0.1)
Basophils Relative: 1 %
Eosinophils Absolute: 0.4 10*3/uL (ref 0.0–0.5)
Eosinophils Relative: 5 %
HCT: 41.5 % (ref 39.0–52.0)
Hemoglobin: 14.3 g/dL (ref 13.0–17.0)
Immature Granulocytes: 0 %
Lymphocytes Relative: 31 %
Lymphs Abs: 2.2 10*3/uL (ref 0.7–4.0)
MCH: 29.7 pg (ref 26.0–34.0)
MCHC: 34.5 g/dL (ref 30.0–36.0)
MCV: 86.3 fL (ref 80.0–100.0)
Monocytes Absolute: 0.5 10*3/uL (ref 0.1–1.0)
Monocytes Relative: 7 %
Neutro Abs: 3.9 10*3/uL (ref 1.7–7.7)
Neutrophils Relative %: 56 %
Platelets: 234 10*3/uL (ref 150–400)
RBC: 4.81 MIL/uL (ref 4.22–5.81)
RDW: 13.1 % (ref 11.5–15.5)
WBC: 7 10*3/uL (ref 4.0–10.5)
nRBC: 0 % (ref 0.0–0.2)

## 2024-01-29 LAB — HEMOGLOBIN A1C
Hgb A1c MFr Bld: 6.8 % — ABNORMAL HIGH (ref 4.8–5.6)
Mean Plasma Glucose: 148.46 mg/dL

## 2024-01-29 LAB — GLUCOSE, CAPILLARY
Glucose-Capillary: 111 mg/dL — ABNORMAL HIGH (ref 70–99)
Glucose-Capillary: 109 mg/dL — ABNORMAL HIGH (ref 70–99)

## 2024-01-29 SURGERY — BURSECTOMY, KNEE
Anesthesia: General | Site: Knee | Laterality: Right

## 2024-01-29 MED ORDER — MIDAZOLAM HCL 2 MG/2ML IJ SOLN
INTRAMUSCULAR | Status: AC
  Filled 2024-01-29: qty 2

## 2024-01-29 MED ORDER — FENTANYL CITRATE (PF) 100 MCG/2ML IJ SOLN
INTRAMUSCULAR | Status: AC
  Filled 2024-01-29: qty 2

## 2024-01-29 MED ORDER — PROPOFOL 10 MG/ML IV BOLUS
INTRAVENOUS | Status: DC | PRN
  Administered 2024-01-29: 200 mg via INTRAVENOUS

## 2024-01-29 MED ORDER — LACTATED RINGERS IV SOLN: INTRAVENOUS | Status: DC

## 2024-01-29 MED ORDER — ONDANSETRON HCL 4 MG/2ML IJ SOLN
INTRAMUSCULAR | Status: AC
  Filled 2024-01-29: qty 2

## 2024-01-29 MED ORDER — ONDANSETRON HCL 4 MG/2ML IJ SOLN: 4.0000 mg | Freq: Once | INTRAMUSCULAR | Status: DC | PRN

## 2024-01-29 MED ORDER — FENTANYL CITRATE (PF) 250 MCG/5ML IJ SOLN
INTRAMUSCULAR | Status: DC | PRN
  Administered 2024-01-29 (×2): 50 ug via INTRAVENOUS

## 2024-01-29 MED ORDER — ORAL CARE MOUTH RINSE: 15.0000 mL | Freq: Once | OROMUCOSAL | Status: AC

## 2024-01-29 MED ORDER — 0.9 % SODIUM CHLORIDE (POUR BTL) OPTIME
TOPICAL | Status: DC | PRN
  Administered 2024-01-29: 1000 mL

## 2024-01-29 MED ORDER — OXYCODONE-ACETAMINOPHEN 5-325 MG PO TABS
1.0000 | ORAL_TABLET | Freq: Four times a day (QID) | ORAL | 0 refills | Status: DC | PRN
  Filled 2024-01-29: qty 20, 5d supply, fill #0

## 2024-01-29 MED ORDER — LIDOCAINE 2% (20 MG/ML) 5 ML SYRINGE
INTRAMUSCULAR | Status: AC
  Filled 2024-01-29: qty 5

## 2024-01-29 MED ORDER — DEXAMETHASONE SODIUM PHOSPHATE 10 MG/ML IJ SOLN
INTRAMUSCULAR | Status: DC | PRN
  Administered 2024-01-29: 10 mg via INTRAVENOUS

## 2024-01-29 MED ORDER — ACETAMINOPHEN 10 MG/ML IV SOLN
1000.0000 mg | Freq: Once | INTRAVENOUS | Status: DC | PRN
  Administered 2024-01-29: 1000 mg via INTRAVENOUS

## 2024-01-29 MED ORDER — DEXAMETHASONE SODIUM PHOSPHATE 10 MG/ML IJ SOLN
INTRAMUSCULAR | Status: AC
  Filled 2024-01-29: qty 1

## 2024-01-29 MED ORDER — FENTANYL CITRATE (PF) 100 MCG/2ML IJ SOLN
25.0000 ug | INTRAMUSCULAR | Status: DC | PRN
  Administered 2024-01-29: 50 ug via INTRAVENOUS

## 2024-01-29 MED ORDER — AMISULPRIDE (ANTIEMETIC) 5 MG/2ML IV SOLN: 10.0000 mg | Freq: Once | INTRAVENOUS | Status: DC | PRN

## 2024-01-29 MED ORDER — ACETAMINOPHEN 10 MG/ML IV SOLN
INTRAVENOUS | Status: AC
  Filled 2024-01-29: qty 100

## 2024-01-29 MED ORDER — ONDANSETRON HCL 4 MG/2ML IJ SOLN
INTRAMUSCULAR | Status: DC | PRN
  Administered 2024-01-29: 4 mg via INTRAVENOUS

## 2024-01-29 MED ORDER — CEFAZOLIN SODIUM-DEXTROSE 2-4 GM/100ML-% IV SOLN
2.0000 g | INTRAVENOUS | Status: AC
  Administered 2024-01-29: 2 g via INTRAVENOUS
  Filled 2024-01-29: qty 100

## 2024-01-29 MED ORDER — PROPOFOL 10 MG/ML IV BOLUS
INTRAVENOUS | Status: AC
  Filled 2024-01-29: qty 20

## 2024-01-29 MED ORDER — CHLORHEXIDINE GLUCONATE 0.12 % MT SOLN
15.0000 mL | Freq: Once | OROMUCOSAL | Status: AC
  Administered 2024-01-29: 15 mL via OROMUCOSAL
  Filled 2024-01-29: qty 15

## 2024-01-29 MED ORDER — LIDOCAINE 2% (20 MG/ML) 5 ML SYRINGE
INTRAMUSCULAR | Status: DC | PRN
  Administered 2024-01-29: 60 mg via INTRAVENOUS

## 2024-01-29 MED ORDER — MIDAZOLAM HCL 2 MG/2ML IJ SOLN
INTRAMUSCULAR | Status: DC | PRN
  Administered 2024-01-29: 2 mg via INTRAVENOUS

## 2024-01-29 MED ORDER — FENTANYL CITRATE (PF) 250 MCG/5ML IJ SOLN
INTRAMUSCULAR | Status: AC
  Filled 2024-01-29: qty 5

## 2024-01-29 MED ORDER — VASHE WOUND IRRIGATION OPTIME
TOPICAL | Status: DC | PRN
  Administered 2024-01-29: 34 [oz_av]

## 2024-01-29 SURGICAL SUPPLY — 34 items
BAG COUNTER SPONGE SURGICOUNT (BAG) ×1 IMPLANT
BNDG COHESIVE 6X5 TAN ST LF (GAUZE/BANDAGES/DRESSINGS) IMPLANT
BNDG COMPR ESMARK 4X3 LF (GAUZE/BANDAGES/DRESSINGS) ×1 IMPLANT
BNDG GAUZE DERMACEA FLUFF 4 (GAUZE/BANDAGES/DRESSINGS) IMPLANT
BNDG STRETCH 4X75 STRL LF (GAUZE/BANDAGES/DRESSINGS) IMPLANT
CORD BIPOLAR FORCEPS 12FT (ELECTRODE) IMPLANT
COVER SURGICAL LIGHT HANDLE (MISCELLANEOUS) ×2 IMPLANT
DRAPE U-SHAPE 47X51 STRL (DRAPES) ×1 IMPLANT
DRSG ADAPTIC 3X8 NADH LF (GAUZE/BANDAGES/DRESSINGS) IMPLANT
DURAPREP 26ML APPLICATOR (WOUND CARE) ×1 IMPLANT
ELECTRODE REM PT RTRN 9FT ADLT (ELECTROSURGICAL) ×1 IMPLANT
GAUZE PAD ABD 8X10 STRL (GAUZE/BANDAGES/DRESSINGS) IMPLANT
GAUZE SPONGE 4X4 12PLY STRL (GAUZE/BANDAGES/DRESSINGS) IMPLANT
GLOVE BIOGEL PI IND STRL 9 (GLOVE) ×1 IMPLANT
GLOVE SURG ORTHO 9.0 STRL STRW (GLOVE) ×1 IMPLANT
GOWN STRL REUS W/ TWL XL LVL3 (GOWN DISPOSABLE) ×2 IMPLANT
KIT BASIN OR (CUSTOM PROCEDURE TRAY) ×1 IMPLANT
KIT TURNOVER KIT B (KITS) ×1 IMPLANT
MANIFOLD NEPTUNE II (INSTRUMENTS) ×1 IMPLANT
NDL HYPO 25GX1X1/2 BEV (NEEDLE) IMPLANT
NEEDLE HYPO 25GX1X1/2 BEV (NEEDLE) IMPLANT
NS IRRIG 1000ML POUR BTL (IV SOLUTION) ×1 IMPLANT
PACK ORTHO EXTREMITY (CUSTOM PROCEDURE TRAY) ×1 IMPLANT
PAD ARMBOARD POSITIONER FOAM (MISCELLANEOUS) ×2 IMPLANT
PAD CAST 4YDX4 CTTN HI CHSV (CAST SUPPLIES) IMPLANT
SPECIMEN JAR SMALL (MISCELLANEOUS) ×1 IMPLANT
SUCTION TUBE FRAZIER 10FR DISP (SUCTIONS) IMPLANT
SUT ETHILON 2 0 PSLX (SUTURE) IMPLANT
SUT VIC AB 2-0 FS1 27 (SUTURE) IMPLANT
SYR CONTROL 10ML LL (SYRINGE) IMPLANT
TOWEL GREEN STERILE (TOWEL DISPOSABLE) ×1 IMPLANT
TOWEL GREEN STERILE FF (TOWEL DISPOSABLE) ×1 IMPLANT
TUBE CONNECTING 12X1/4 (SUCTIONS) IMPLANT
WATER STERILE IRR 1000ML POUR (IV SOLUTION) ×1 IMPLANT

## 2024-01-29 NOTE — Discharge Instructions (Addendum)
 Keep dressing clean and dry for 1 week until follow up visit in the office.   Elevate your leg to help with swelling. Slowly increase activity

## 2024-01-29 NOTE — Transfer of Care (Signed)
 Immediate Anesthesia Transfer of Care Note  Patient: Philip Huang  Procedure(s) Performed: BURSECTOMY, KNEE RIGHT (Right: Knee)  Patient Location: PACU  Anesthesia Type:General  Level of Consciousness: awake  Airway & Oxygen Therapy: Patient Spontanous Breathing and Patient connected to nasal cannula oxygen  Post-op Assessment: Report given to RN and Post -op Vital signs reviewed and stable  Post vital signs: Reviewed  Last Vitals:  Vitals Value Taken Time  BP 139/66 01/29/24 11:24  Temp    Pulse 70 01/29/24 11:28  Resp 16 01/29/24 11:28  SpO2 97 % 01/29/24 11:28  Vitals shown include unfiled device data.  Last Pain:  Vitals:   01/29/24 0843  TempSrc:   PainSc: 0-No pain         Complications: No notable events documented.

## 2024-01-29 NOTE — Progress Notes (Signed)
 Wasted 50mcg of levo with Larraine Anon RN.

## 2024-01-29 NOTE — Anesthesia Procedure Notes (Signed)
 Procedure Name: LMA Insertion Date/Time: 01/29/2024 10:50 AM  Performed by: Julien Manus, CRNAPre-anesthesia Checklist: Patient identified, Emergency Drugs available, Suction available and Patient being monitored Patient Re-evaluated:Patient Re-evaluated prior to induction Oxygen Delivery Method: Circle System Utilized Preoxygenation: Pre-oxygenation with 100% oxygen Induction Type: IV induction Ventilation: Mask ventilation without difficulty LMA: LMA inserted LMA Size: 4.0 Number of attempts: 1 Airway Equipment and Method: Bite block Placement Confirmation: positive ETCO2 Tube secured with: Tape Dental Injury: Teeth and Oropharynx as per pre-operative assessment

## 2024-01-29 NOTE — Op Note (Signed)
 01/29/2024  11:20 AM  PATIENT:  Philip Huang    PRE-OPERATIVE DIAGNOSIS:  Infected Right Knee Bursa  POST-OPERATIVE DIAGNOSIS:  Same  PROCEDURE:  BURSECTOMY, KNEE RIGHT Tissue sent for cultures. Local tissue transfer for wound closure 4 x 10 cm.  SURGEON:  Jerona LULLA Sage, MD  PHYSICIAN ASSISTANT: April Green ANESTHESIA:   General  PREOPERATIVE INDICATIONS:  Philip Huang is a  73 y.o. male with a diagnosis of Infected Right Knee Bursa who failed conservative measures and elected for surgical management.    The risks benefits and alternatives were discussed with the patient preoperatively including but not limited to the risks of infection, bleeding, nerve injury, cardiopulmonary complications, the need for revision surgery, among others, and the patient was willing to proceed.  OPERATIVE IMPLANTS:   * No implants in log *  @ENCIMAGES @  OPERATIVE FINDINGS: Bursal was excised in 1 block of tissue this was sent for cultures.  Tissue margins were clear.  OPERATIVE PROCEDURE: Patient was brought the operating room and underwent general anesthetic.  After adequate levels anesthesia obtained patient's right lower extremity was prepped using DuraPrep draped into a sterile field a timeout was called.  Elliptical incision was made around the ulcerative tissue.  This left a wound that was 4 x 10 cm.  The tissue margins were undermined to allow for tissue transfer.  The bursa was excised in 1 block of tissue this was sent for cultures.  The wound was irrigated with Vashe electrocautery was used hemostasis.  Local tissue transfer was used to close the wound 10 x 4 cm.  A sterile compressive dressing was applied patient was taken the PACU in stable condition.   DISCHARGE PLANNING:  Antibiotic duration: Preoperative antibiotics will add postoperative antibiotics if cultures are positive  Weightbearing: Weightbearing as tolerated  Pain medication: Percocet  Dressing care/ Wound VAC:  Dry dressing change in 1 week  Ambulatory devices: Crutches  Discharge to: Home.  Follow-up: In the office 1 week post operative.

## 2024-01-29 NOTE — Interval H&P Note (Signed)
 History and Physical Interval Note:  01/29/2024 10:35 AM  Philip Huang  has presented today for surgery, with the diagnosis of Infected Right Knee Bursa.  The various methods of treatment have been discussed with the patient and family. After consideration of risks, benefits and other options for treatment, the patient has consented to  Procedure(s) with comments: BURSECTOMY, KNEE RIGHT (Right) - RIGHT KNEE BURSA EXCISION as a surgical intervention.  The patient's history has been reviewed, patient examined, no change in status, stable for surgery.  I have reviewed the patient's chart and labs.  Questions were answered to the patient's satisfaction.     Eboni Coval V Elisha Mcgruder

## 2024-01-30 ENCOUNTER — Encounter (HOSPITAL_COMMUNITY): Payer: Self-pay | Admitting: Orthopedic Surgery

## 2024-01-30 NOTE — Anesthesia Postprocedure Evaluation (Signed)
 Anesthesia Post Note  Patient: Philip Huang  Procedure(s) Performed: BURSECTOMY, KNEE RIGHT (Right: Knee)     Patient location during evaluation: PACU Anesthesia Type: General Level of consciousness: awake Pain management: pain level controlled Vital Signs Assessment: post-procedure vital signs reviewed and stable Respiratory status: spontaneous breathing, nonlabored ventilation and respiratory function stable Cardiovascular status: blood pressure returned to baseline and stable Postop Assessment: no apparent nausea or vomiting Anesthetic complications: no   No notable events documented.  Last Vitals:  Vitals:   01/29/24 1130 01/29/24 1200  BP: (!) 147/69 (!) 153/70  Pulse: 66 66  Resp: 12 15  Temp:  36.6 C  SpO2: 95% 93%    Last Pain:  Vitals:   01/29/24 1155  TempSrc:   PainSc: 9                  [name]

## 2024-02-03 ENCOUNTER — Other Ambulatory Visit: Payer: Self-pay | Admitting: Orthopedic Surgery

## 2024-02-03 ENCOUNTER — Ambulatory Visit: Payer: Self-pay | Admitting: Orthopedic Surgery

## 2024-02-03 LAB — AEROBIC/ANAEROBIC CULTURE W GRAM STAIN (SURGICAL/DEEP WOUND): Gram Stain: NONE SEEN

## 2024-02-03 MED ORDER — AMOXICILLIN-POT CLAVULANATE 875-125 MG PO TABS: 1.0000 | ORAL_TABLET | Freq: Two times a day (BID) | ORAL | 0 refills | Status: DC

## 2024-02-03 NOTE — Telephone Encounter (Signed)
 Pt's wife informed. She will pick up today and they have an appt on Wednesday.

## 2024-02-03 NOTE — Telephone Encounter (Signed)
-----   Message from Jerona LULLA Sage sent at 02/03/2024  9:56 AM EDT ----- Cultures are showing staph epi.  Prescription sent for Augmentin . ----- Message ----- From: Rebecka, [facility] [place] Sent: 01/29/2024   3:37 PM EDT To: Jerona Sage LULLA, MD

## 2024-02-05 ENCOUNTER — Encounter: Payer: Self-pay | Admitting: Family

## 2024-02-05 ENCOUNTER — Ambulatory Visit (INDEPENDENT_AMBULATORY_CARE_PROVIDER_SITE_OTHER): Admitting: Family

## 2024-02-05 DIAGNOSIS — M7041 Prepatellar bursitis, right knee: Secondary | ICD-10-CM

## 2024-02-05 NOTE — Progress Notes (Signed)
 Post-Op Visit Note   Patient: Philip Huang           Date of Birth: 08/02/1951           MRN: [id number] Visit Date: 02/05/2024 PCP: [name], Fonda LABOR, MD  Chief Complaint:  Chief Complaint  Patient presents with   Right Knee - Routine Post Op    01/29/2024 right knee bursectomy     HPI:  HPI The patient is a 73 year old gentleman who is seen status post right knee bursectomy last Wednesday he continues on amoxicillin  twice daily. Ortho Exam Examination right knee the incision is well-approximated sutures there is no gaping or drainage minimal swelling no erythema  Visit Diagnoses: No diagnosis found.  Plan: Begin daily Dial soap cleansing.  Dry dressings.  Declined offer for knee immobilizer today he will follow-up in 1 week for suture removal.  Follow-Up Instructions: No follow-ups on file.   Imaging: No results found.  Orders:  No orders of the defined types were placed in this encounter.  No orders of the defined types were placed in this encounter.    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 03/02/2016   BMI 28.0-28.9,adult    Hyperlipidemia associated with type 2 diabetes mellitus (HCC) 12/19/2010   Hypertension associated with diabetes (HCC) 12/19/2010   Type II diabetes mellitus (HCC) 12/19/2010   Past Medical History:  Diagnosis Date   Cancer (HCC)    skin ca   COVID 04/06/2023   treated   Diabetes mellitus    type 2   GERD (gastroesophageal reflux disease)    otc med prn   History of kidney stones    passed stones   HTN (hypertension)    Hyperlipidemia    Overweight(278.02)    Squamous acanthoma of upper extremity      Family History  Problem Relation Age of Onset   Diabetes Mother    Heart disease Mother 41       CABG   Hyperlipidemia Mother    Hypertension Mother    Parkinsonism Father    Diabetes Sister    Hyperlipidemia Sister    Hypertension Sister    Diabetes Brother    Hyperlipidemia Brother    Hypertension Brother     Past Surgical History:  Procedure Laterality Date   COLONOSCOPY     x several   FINGER SURGERY Left    ring finger - at [facility]   KNEE BURSECTOMY Right 01/29/2024   Procedure: BURSECTOMY, KNEE RIGHT;  Surgeon: Harden Jerona GAILS, MD;  Location: [facility] OR;  Service: Orthopedics;  Laterality: Right;  RIGHT KNEE BURSA EXCISION   squamous acanthoma of upper extremity     WRIST SURGERY     at [facility]   Social History   Occupational History   Occupation: Retired    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

## 2024-02-11 ENCOUNTER — Encounter: Payer: Self-pay | Admitting: Orthopedic Surgery

## 2024-02-11 ENCOUNTER — Ambulatory Visit (INDEPENDENT_AMBULATORY_CARE_PROVIDER_SITE_OTHER): Admitting: Orthopedic Surgery

## 2024-02-11 DIAGNOSIS — M7041 Prepatellar bursitis, right knee: Secondary | ICD-10-CM

## 2024-02-11 NOTE — Progress Notes (Unsigned)
 Office Visit Note   Patient: Philip Huang           Date of Birth: Dec 16, 1950           MRN: [id number] Visit Date: 02/11/2024              Requested by: [name], Fonda LABOR, MD [address] [place],  [place] [address] PCP: [name], Fonda LABOR, MD  Chief Complaint  Patient presents with   Right Knee - Routine Post Op    01/29/2024 right knee bursectomy       HPI: Patient presents 2 weeks status post excision prepatellar bursitis right knee.  Assessment & Plan: Visit Diagnoses:  1. Prepatellar bursitis of right knee     Plan: Incision is well-approximated continue with 4 x 4 gauze and Ace wrap.  Do not flex greater than 90 degrees.  Complete course of Augmentin .  Follow-up in 2 weeks to harvest sutures.  Follow-Up Instructions: No follow-ups on file.   Ortho Exam  Patient is alert, oriented, no adenopathy, well-dressed, normal affect, normal respiratory effort. Examination the incision is well-approximated there is no redness or cellulitis.  Patient can flex his knee to 90 degrees without any stress on the wound.  The cultures are showing staph that are sensitive to Augmentin . Patient is not wearing the immobilizer.   Imaging: No results found. No images are attached to the encounter.  Labs: Lab Results  Component Value Date   HGBA1C 6.8 (H) 01/29/2024   HGBA1C 6.7 (H) 01/16/2024   HGBA1C 7.5 (H) 10/08/2023   REPTSTATUS 02/03/2024 FINAL 01/29/2024   GRAMSTAIN NO WBC SEEN NO ORGANISMS SEEN  01/29/2024   CULT  01/29/2024    FEW STAPHYLOCOCCUS EPIDERMIDIS NO ANAEROBES ISOLATED Performed at [facility], [address], [place], [place] [address]    [facility] STAPHYLOCOCCUS EPIDERMIDIS 01/29/2024     Lab Results  Component Value Date   ALBUMIN 3.7 01/29/2024   ALBUMIN 4.5 10/08/2023   ALBUMIN 4.5 07/02/2023    No results found for: MG Lab Results  Component Value Date   VD25OH 42 02/02/2013    No results found for: PREALBUMIN    Latest  Ref Rng & Units 01/29/2024    8:16 AM 10/08/2023    8:35 AM 03/25/2023    8:21 AM  CBC EXTENDED  WBC 4.0 - 10.5 K/uL 7.0  7.7  8.0   RBC 4.22 - 5.81 MIL/uL 4.81  4.77  4.98   Hemoglobin 13.0 - 17.0 g/dL 85.6  85.7  85.7   HCT 39.0 - 52.0 % 41.5  41.8  43.3   Platelets 150 - 400 K/uL 234  263  260   NEUT# 1.7 - 7.7 K/uL 3.9  4.0  4.8   Lymph# 0.7 - 4.0 K/uL 2.2  2.5  2.1      There is no height or weight on file to calculate BMI.  Orders:  No orders of the defined types were placed in this encounter.  No orders of the defined types were placed in this encounter.    Procedures: No procedures performed  Clinical Data: No additional findings.  ROS:  All other systems negative, except as noted in the HPI. Review of Systems  Objective: Vital Signs: There were no vitals taken for this visit.  Specialty Comments:  No specialty comments available.  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 03/02/2016   BMI 28.0-28.9,adult    Hyperlipidemia associated with type 2 diabetes mellitus (HCC) 12/19/2010   Hypertension associated with diabetes (HCC) 12/19/2010   Type II diabetes mellitus (HCC) 12/19/2010   Past Medical History:  Diagnosis Date   Cancer (HCC)    skin ca   COVID 04/06/2023   treated   Diabetes mellitus    type 2   GERD (gastroesophageal reflux disease)    otc med prn   History of kidney stones    passed stones   HTN (hypertension)    Hyperlipidemia    Overweight(278.02)    Squamous acanthoma of upper extremity     Family History  Problem Relation Age of Onset   Diabetes Mother    Heart disease Mother 47       CABG   Hyperlipidemia  Mother    Hypertension Mother    Parkinsonism Father    Diabetes Sister    Hyperlipidemia Sister    Hypertension Sister    Diabetes Brother    Hyperlipidemia Brother    Hypertension Brother     Past Surgical History:  Procedure Laterality Date   COLONOSCOPY     x several   FINGER SURGERY Left    ring finger - at [facility]   KNEE BURSECTOMY Right 01/29/2024   Procedure: BURSECTOMY, KNEE RIGHT;  Surgeon: Harden Jerona GAILS, MD;  Location: [facility] OR;  Service: Orthopedics;  Laterality: Right;  RIGHT KNEE BURSA EXCISION   squamous acanthoma of upper extremity     WRIST SURGERY     at [facility]   Social History   Occupational History   Occupation: Retired    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

## 2024-02-13 ENCOUNTER — Telehealth: Payer: Self-pay

## 2024-02-13 NOTE — Telephone Encounter (Signed)
 Pt dropped of BP readings. Per Dr. Maryanne readings look good. Pts wife made aware. Readings are between 120-149 systolic and 54-79 diastolic.

## 2024-02-20 ENCOUNTER — Ambulatory Visit: Admitting: Family Medicine

## 2024-02-27 ENCOUNTER — Ambulatory Visit (INDEPENDENT_AMBULATORY_CARE_PROVIDER_SITE_OTHER): Admitting: Physician Assistant

## 2024-02-27 DIAGNOSIS — S81001A Unspecified open wound, right knee, initial encounter: Secondary | ICD-10-CM

## 2024-02-27 DIAGNOSIS — M7041 Prepatellar bursitis, right knee: Secondary | ICD-10-CM

## 2024-02-28 ENCOUNTER — Encounter: Payer: Self-pay | Admitting: Physician Assistant

## 2024-02-28 NOTE — Progress Notes (Signed)
 Office Visit Note   Patient: Philip Huang           Date of Birth: 09-Oct-1950           MRN: [id number] Visit Date: 02/27/2024              Requested by: [name], Fonda LABOR, MD [address] [place],  [place] [address] PCP: [name], Fonda LABOR, MD  Chief Complaint  Patient presents with   Right Knee - Routine Post Op    01/29/2024 right knee bursectomy          HPI: Patient presents status post excision prepatellar bursitis right knee performed on 01/29/24 for Infected Right Knee Burs with chronic sinus drainage.  He has done well and the incision appears well healed.  He is here today for follow up and suture removal.   Assessment & Plan: Visit Diagnoses: No diagnosis found.  Plan: Gradually increase activity of daily living.  Massage scar wit hcocco butter.  Follow-Up Instructions: Return if symptoms worsen or fail to improve.   Ortho Exam  Patient is alert, oriented, no adenopathy, well-dressed, normal affect, normal respiratory effort. Minimal right knee edema.  Incision well healed without cellulitis or active drainage. Operative culture showed Staph.  He finished his oral antibiotics.  Good active ROM.   No fluctuance.   Imaging: No results found. No images are attached to the encounter.  Labs: Lab Results  Component Value Date   HGBA1C 6.8 (H) 01/29/2024   HGBA1C 6.7 (H) 01/16/2024   HGBA1C 7.5 (H) 10/08/2023   REPTSTATUS 02/03/2024 FINAL 01/29/2024   GRAMSTAIN NO WBC SEEN NO ORGANISMS SEEN  01/29/2024   CULT  01/29/2024    FEW STAPHYLOCOCCUS EPIDERMIDIS NO ANAEROBES ISOLATED Performed at [facility], [address], [place], [place] [address]    [facility] STAPHYLOCOCCUS EPIDERMIDIS 01/29/2024     Lab Results  Component Value Date   ALBUMIN 3.7 01/29/2024   ALBUMIN 4.5 10/08/2023   ALBUMIN 4.5 07/02/2023    No results found for: MG Lab Results  Component Value Date   VD25OH 42 02/02/2013    No results found for:  PREALBUMIN    Latest Ref Rng & Units 01/29/2024    8:16 AM 10/08/2023    8:35 AM 03/25/2023    8:21 AM  CBC EXTENDED  WBC 4.0 - 10.5 K/uL 7.0  7.7  8.0   RBC 4.22 - 5.81 MIL/uL 4.81  4.77  4.98   Hemoglobin 13.0 - 17.0 g/dL 85.6  85.7  85.7   HCT 39.0 - 52.0 % 41.5  41.8  43.3   Platelets 150 - 400 K/uL 234  263  260   NEUT# 1.7 - 7.7 K/uL 3.9  4.0  4.8   Lymph# 0.7 - 4.0 K/uL 2.2  2.5  2.1      There is no height or weight on file to calculate BMI.  Orders:  No orders of the defined types were placed in this encounter.  No orders of the defined types were placed in this encounter.    Procedures: No procedures performed  Clinical Data: No additional findings.  ROS:  All other systems negative, except as noted in the HPI. Review of Systems  Objective: Vital Signs: There were no vitals taken for this visit.  Specialty Comments:  No specialty comments available.  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 03/02/2016   BMI 28.0-28.9,adult    Hyperlipidemia associated with type 2 diabetes mellitus (HCC) 12/19/2010   Hypertension associated with diabetes (HCC) 12/19/2010   Type II diabetes mellitus (HCC) 12/19/2010   Past Medical History:  Diagnosis Date   Cancer (HCC)    skin ca   COVID 04/06/2023   treated   Diabetes mellitus    type 2   GERD (gastroesophageal reflux disease)    otc med prn   History of kidney stones    passed stones   HTN (hypertension)    Hyperlipidemia    Overweight(278.02)    Squamous acanthoma of upper extremity     Family History  Problem Relation Age of Onset   Diabetes Mother    Heart disease Mother 109        CABG   Hyperlipidemia Mother    Hypertension Mother    Parkinsonism Father    Diabetes Sister    Hyperlipidemia Sister    Hypertension Sister    Diabetes Brother    Hyperlipidemia Brother    Hypertension Brother     Past Surgical History:  Procedure Laterality Date   COLONOSCOPY     x several   FINGER SURGERY Left    ring finger - at [facility]   KNEE BURSECTOMY Right 01/29/2024   Procedure: BURSECTOMY, KNEE RIGHT;  Surgeon: Harden Jerona GAILS, MD;  Location: [facility] OR;  Service: Orthopedics;  Laterality: Right;  RIGHT KNEE BURSA EXCISION   squamous acanthoma of upper extremity     WRIST SURGERY     at [facility]   Social History   Occupational History   Occupation: Retired    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

## 2024-03-04 DIAGNOSIS — Z85828 Personal history of other malignant neoplasm of skin: Secondary | ICD-10-CM | POA: Diagnosis not present

## 2024-03-04 DIAGNOSIS — Z85820 Personal history of malignant melanoma of skin: Secondary | ICD-10-CM | POA: Diagnosis not present

## 2024-03-04 DIAGNOSIS — L820 Inflamed seborrheic keratosis: Secondary | ICD-10-CM | POA: Diagnosis not present

## 2024-03-04 DIAGNOSIS — D235 Other benign neoplasm of skin of trunk: Secondary | ICD-10-CM | POA: Diagnosis not present

## 2024-03-04 DIAGNOSIS — D225 Melanocytic nevi of trunk: Secondary | ICD-10-CM | POA: Diagnosis not present

## 2024-03-04 DIAGNOSIS — L579 Skin changes due to chronic exposure to nonionizing radiation, unspecified: Secondary | ICD-10-CM | POA: Diagnosis not present

## 2024-03-04 DIAGNOSIS — B353 Tinea pedis: Secondary | ICD-10-CM | POA: Diagnosis not present

## 2024-03-04 DIAGNOSIS — B351 Tinea unguium: Secondary | ICD-10-CM | POA: Diagnosis not present

## 2024-03-04 DIAGNOSIS — L570 Actinic keratosis: Secondary | ICD-10-CM | POA: Diagnosis not present

## 2024-03-11 ENCOUNTER — Telehealth: Payer: Self-pay

## 2024-03-11 NOTE — Telephone Encounter (Signed)
 Per DPR spoke to patients wife Hope, informed Hope that patients Ozempic  has arrived in office and is ready for pick up.

## 2024-03-16 LAB — FECAL OCCULT BLOOD, IMMUNOCHEMICAL: Fecal Occult Blood: NEGATIVE

## 2024-03-27 ENCOUNTER — Other Ambulatory Visit: Payer: Self-pay | Admitting: Family Medicine

## 2024-03-27 DIAGNOSIS — E1169 Type 2 diabetes mellitus with other specified complication: Secondary | ICD-10-CM

## 2024-04-03 ENCOUNTER — Telehealth: Payer: Self-pay | Admitting: Family Medicine

## 2024-04-03 ENCOUNTER — Other Ambulatory Visit: Payer: Self-pay | Admitting: Family Medicine

## 2024-04-03 DIAGNOSIS — Z125 Encounter for screening for malignant neoplasm of prostate: Secondary | ICD-10-CM

## 2024-04-03 DIAGNOSIS — E1169 Type 2 diabetes mellitus with other specified complication: Secondary | ICD-10-CM

## 2024-04-03 DIAGNOSIS — E1159 Type 2 diabetes mellitus with other circulatory complications: Secondary | ICD-10-CM

## 2024-04-03 NOTE — Telephone Encounter (Signed)
 Please put lab orders in for pt to come in on 04-13-2024 for labs/urine test & on 04-17-2024 pt is seeing [name].

## 2024-04-03 NOTE — Telephone Encounter (Signed)
 ORDERS PLACED.

## 2024-04-13 ENCOUNTER — Other Ambulatory Visit

## 2024-04-13 DIAGNOSIS — E785 Hyperlipidemia, unspecified: Secondary | ICD-10-CM | POA: Diagnosis not present

## 2024-04-13 DIAGNOSIS — Z125 Encounter for screening for malignant neoplasm of prostate: Secondary | ICD-10-CM

## 2024-04-13 DIAGNOSIS — I152 Hypertension secondary to endocrine disorders: Secondary | ICD-10-CM

## 2024-04-13 DIAGNOSIS — E1159 Type 2 diabetes mellitus with other circulatory complications: Secondary | ICD-10-CM | POA: Diagnosis not present

## 2024-04-13 DIAGNOSIS — E1169 Type 2 diabetes mellitus with other specified complication: Secondary | ICD-10-CM | POA: Diagnosis not present

## 2024-04-13 LAB — BAYER DCA HB A1C WAIVED: HB A1C (BAYER DCA - WAIVED): 6.6 % — ABNORMAL HIGH (ref 4.8–5.6)

## 2024-04-13 LAB — LIPID PANEL

## 2024-04-14 LAB — CBC WITH DIFFERENTIAL/PLATELET
Basophils Absolute: 0.1 x10E3/uL (ref 0.0–0.2)
Basos: 1 %
EOS (ABSOLUTE): 0.4 x10E3/uL (ref 0.0–0.4)
Eos: 5 %
Hematocrit: 45.4 % (ref 37.5–51.0)
Hemoglobin: 15.1 g/dL (ref 13.0–17.7)
Immature Grans (Abs): 0 x10E3/uL (ref 0.0–0.1)
Immature Granulocytes: 0 %
Lymphocytes Absolute: 2.4 x10E3/uL (ref 0.7–3.1)
Lymphs: 31 %
MCH: 30.1 pg (ref 26.6–33.0)
MCHC: 33.3 g/dL (ref 31.5–35.7)
MCV: 90 fL (ref 79–97)
Monocytes Absolute: 0.6 x10E3/uL (ref 0.1–0.9)
Monocytes: 8 %
Neutrophils Absolute: 4.3 x10E3/uL (ref 1.4–7.0)
Neutrophils: 55 %
Platelets: 285 x10E3/uL (ref 150–450)
RBC: 5.02 x10E6/uL (ref 4.14–5.80)
RDW: 13.4 % (ref 11.6–15.4)
WBC: 7.7 x10E3/uL (ref 3.4–10.8)

## 2024-04-14 LAB — CMP14+EGFR
ALT: 31 IU/L (ref 0–44)
AST: 30 IU/L (ref 0–40)
Albumin: 4.6 g/dL (ref 3.8–4.8)
Alkaline Phosphatase: 47 IU/L (ref 44–121)
BUN/Creatinine Ratio: 16 (ref 10–24)
BUN: 18 mg/dL (ref 8–27)
Bilirubin Total: 0.4 mg/dL (ref 0.0–1.2)
CO2: 22 mmol/L (ref 20–29)
Calcium: 9.6 mg/dL (ref 8.6–10.2)
Chloride: 101 mmol/L (ref 96–106)
Creatinine, Ser: 1.1 mg/dL (ref 0.76–1.27)
Globulin, Total: 2.6 g/dL (ref 1.5–4.5)
Glucose: 102 mg/dL — AB (ref 70–99)
Potassium: 3.8 mmol/L (ref 3.5–5.2)
Sodium: 140 mmol/L (ref 134–144)
Total Protein: 7.2 g/dL (ref 6.0–8.5)
eGFR: 72 mL/min/1.73 (ref >59)

## 2024-04-14 LAB — LIPID PANEL
Cholesterol, Total: 162 mg/dL (ref 100–199)
HDL: 45 mg/dL (ref >39)
LDL CALC COMMENT:: 3.6 ratio (ref 0.0–5.0)
LDL Chol Calc (NIH): 93 mg/dL (ref 0–99)
Triglycerides: 136 mg/dL (ref 0–149)
VLDL Cholesterol Cal: 24 mg/dL (ref 5–40)

## 2024-04-14 LAB — PSA, TOTAL AND FREE
PSA, Free Pct: 54
PSA, Free: 0.27 ng/mL
Prostate Specific Ag, Serum: 0.5 ng/mL (ref 0.0–4.0)

## 2024-04-14 LAB — MICROALBUMIN / CREATININE URINE RATIO
Creatinine, Urine: 99.5 mg/dL
Microalb/Creat Ratio: 6 mg/g{creat} (ref 0–29)
Microalbumin, Urine: 5.8 ug/mL

## 2024-04-17 ENCOUNTER — Encounter: Payer: Self-pay | Admitting: Family Medicine

## 2024-04-17 ENCOUNTER — Ambulatory Visit: Admitting: Family Medicine

## 2024-04-17 VITALS — BP 118/64 | HR 61 | Temp 97.8°F | Ht 69.0 in | Wt 171.0 lb

## 2024-04-17 DIAGNOSIS — Z23 Encounter for immunization: Secondary | ICD-10-CM

## 2024-04-17 DIAGNOSIS — I152 Hypertension secondary to endocrine disorders: Secondary | ICD-10-CM

## 2024-04-17 DIAGNOSIS — E1169 Type 2 diabetes mellitus with other specified complication: Secondary | ICD-10-CM

## 2024-04-17 DIAGNOSIS — E785 Hyperlipidemia, unspecified: Secondary | ICD-10-CM | POA: Diagnosis not present

## 2024-04-17 DIAGNOSIS — Z7984 Long term (current) use of oral hypoglycemic drugs: Secondary | ICD-10-CM

## 2024-04-17 DIAGNOSIS — E1159 Type 2 diabetes mellitus with other circulatory complications: Secondary | ICD-10-CM

## 2024-04-17 NOTE — Progress Notes (Signed)
 BP 118/64   Pulse 61   Temp 97.8 F (36.6 C)   Ht 5' 9 (1.753 m)   Wt 171 lb (77.6 kg)   SpO2 98%   BMI 25.25 kg/m    Subjective:   Patient ID: Philip Huang, male    DOB: 05-03-1951, 73 y.o.   MRN: [id number]  HPI: Philip Huang is a 73 y.o. male presenting on 04/17/2024 for Medical Management of Chronic Issues, Diabetes, Hyperlipidemia, and Hypertension   Discussed the use of AI scribe software for clinical note transcription with the patient, who gave verbal consent to proceed.  History of Present Illness   Philip Huang is a 73 year old male with diabetes who presents for a follow-up visit.  His blood sugar levels are generally well-controlled, with morning readings in the eighties and nineties, though he occasionally experiences spikes up to two hundred. His recent hemoglobin A1c was 6.6%. He is currently on a regimen of Jardiance , glipizide , metformin , and Ozempic  for diabetes management. No issues with these medications and he continues to work with [employer] to obtain them through an assistance program.  For blood pressure management, he takes lisinopril  and hydrochlorothiazide . He also takes Crestor  and fenofibrate  for cholesterol management, with recent cholesterol levels being satisfactory.  He recently underwent surgery and has a scar. He is using cocoa butter to aid in scar healing.  Family history is significant for his mother, who was a severe diabetic and underwent triple bypass surgery before passing away.          Relevant past medical, surgical, family and social history reviewed and updated as indicated. Interim medical history since our last visit reviewed. Allergies and medications reviewed and updated.  Review of Systems  Constitutional:  Negative for chills and fever.  Eyes:  Negative for visual disturbance.  Respiratory:  Negative for shortness of breath and wheezing.   Cardiovascular:  Negative for chest pain and leg swelling.   Musculoskeletal:  Positive for arthralgias. Negative for back pain and gait problem.  Skin:  Negative for rash.  Neurological:  Negative for dizziness and light-headedness.  All other systems reviewed and are negative.   Per HPI unless specifically indicated above   Allergies as of 04/17/2024       Reactions   Sulfa  Antibiotics Rash        Medication List        Accurate as of April 17, 2024  8:20 AM. If you have any questions, ask your nurse or doctor.          STOP taking these medications    amoxicillin -clavulanate 875-125 MG tablet Commonly known as: AUGMENTIN  Stopped by: Fonda LABOR Gentry Pilson   mupirocin  ointment 2 % Commonly known as: BACTROBAN  Stopped by: Lateisha Thurlow A Jaziel Bennett   oxyCODONE -acetaminophen  5-325 MG tablet Commonly known as: PERCOCET/ROXICET Stopped by: Fonda LABOR Herson Prichard       TAKE these medications    Accu-Chek Aviva Plus test strip Generic drug: glucose blood TEST BLOOD SUGAR TWICE DAILY Dx E11.9   Accu-Chek Softclix Lancets lancets TEST BLOOD SUGAR TWICE DAILY Dx E11.9   aspirin 81 MG tablet Take 81 mg by mouth daily.   DropSafe Alcohol  Prep 70 % Pads TEST BLOOD SUGAR TWICE DAILY Dx E11.9   fenofibrate  160 MG tablet TAKE 1 TABLET EVERY DAY   fish oil-omega-3 fatty acids 1000 MG capsule Take 1 g by mouth daily.   glipiZIDE  10 MG 24 hr tablet Commonly known as: GLUCOTROL  XL TAKE  1 TABLET EVERY DAY WITH BREAKFAST   Jardiance  25 MG Tabs tablet Generic drug: empagliflozin  TAKE ONE TABLET BY MOUTH DAILY BEFORE BREAKFAST   lisinopril -hydrochlorothiazide  20-12.5 MG tablet Commonly known as: ZESTORETIC  TAKE 2 TABLETS EVERY DAY   MENS 50+ ADVANCED PO Take 1 tablet by mouth daily.   metFORMIN  1000 MG tablet Commonly known as: GLUCOPHAGE  TAKE 1 TABLET TWICE DAILY WITH MEALS   nitroGLYCERIN  0.4 MG SL tablet Commonly known as: NITROSTAT  Place 1 tablet (0.4 mg total) under the tongue every 5 (five) minutes as needed.    rosuvastatin  40 MG tablet Commonly known as: CRESTOR  TAKE 1 TABLET EVERY DAY   Semaglutide  (2 MG/DOSE) 8 MG/3ML Sopn Inject 2 mg as directed once a week.   Vitamin D  50 MCG (2000 UT) tablet Take 2,000 Units by mouth daily.         Objective:   BP 118/64   Pulse 61   Temp 97.8 F (36.6 C)   Ht 5' 9 (1.753 m)   Wt 171 lb (77.6 kg)   SpO2 98%   BMI 25.25 kg/m   Wt Readings from Last 3 Encounters:  04/17/24 171 lb (77.6 kg)  01/29/24 178 lb (80.7 kg)  01/22/24 178 lb (80.7 kg)    Physical Exam Physical Exam   VITALS: BP- 118/64 NECK: Thyroid  without lumps. CHEST: Lungs clear to auscultation bilaterally. CARDIOVASCULAR: Regular heart sounds, no murmurs. EXTREMITIES: Pulses present, no swelling, no cuts, no sores in feet.       Results for orders placed or performed in visit on 04/13/24  Bayer DCA Hb A1c Waived   Collection Time: 04/13/24  9:12 AM  Result Value Ref Range   HB A1C (BAYER DCA - WAIVED) 6.6 (H) 4.8 - 5.6 %  PSA, total and free   Collection Time: 04/13/24  9:20 AM  Result Value Ref Range   Prostate Specific Ag, Serum 0.5 0.0 - 4.0 ng/mL   PSA, Free 0.27 N/A ng/mL   PSA, Free Pct 54.0 %  Lipid panel   Collection Time: 04/13/24  9:20 AM  Result Value Ref Range   Cholesterol, Total 162 100 - 199 mg/dL   Triglycerides 863 0 - 149 mg/dL   HDL 45 >60 mg/dL   VLDL Cholesterol Cal 24 5 - 40 mg/dL   LDL Chol Calc (NIH) 93 0 - 99 mg/dL   Chol/HDL Ratio 3.6 0.0 - 5.0 ratio  CBC with Differential/Platelet   Collection Time: 04/13/24  9:20 AM  Result Value Ref Range   WBC 7.7 3.4 - 10.8 x10E3/uL   RBC 5.02 4.14 - 5.80 x10E6/uL   Hemoglobin 15.1 13.0 - 17.7 g/dL   Hematocrit 54.5 62.4 - 51.0 %   MCV 90 79 - 97 fL   MCH 30.1 26.6 - 33.0 pg   MCHC 33.3 31.5 - 35.7 g/dL   RDW 86.5 88.3 - 84.5 %   Platelets 285 150 - 450 x10E3/uL   Neutrophils 55 Not Estab. %   Lymphs 31 Not Estab. %   Monocytes 8 Not Estab. %   Eos 5 Not Estab. %   Basos 1 Not  Estab. %   Neutrophils Absolute 4.3 1.4 - 7.0 x10E3/uL   Lymphocytes Absolute 2.4 0.7 - 3.1 x10E3/uL   Monocytes Absolute 0.6 0.1 - 0.9 x10E3/uL   EOS (ABSOLUTE) 0.4 0.0 - 0.4 x10E3/uL   Basophils Absolute 0.1 0.0 - 0.2 x10E3/uL   Immature Granulocytes 0 Not Estab. %   Immature Grans (Abs) 0.0 0.0 -  0.1 x10E3/uL  CMP14+EGFR   Collection Time: 04/13/24  9:20 AM  Result Value Ref Range   Glucose 102 (H) 70 - 99 mg/dL   BUN 18 8 - 27 mg/dL   Creatinine, Ser 8.89 0.76 - 1.27 mg/dL   eGFR 72 >40 fO/fpw/8.26   BUN/Creatinine Ratio 16 10 - 24   Sodium 140 134 - 144 mmol/L   Potassium 3.8 3.5 - 5.2 mmol/L   Chloride 101 96 - 106 mmol/L   CO2 22 20 - 29 mmol/L   Calcium  9.6 8.6 - 10.2 mg/dL   Total Protein 7.2 6.0 - 8.5 g/dL   Albumin 4.6 3.8 - 4.8 g/dL   Globulin, Total 2.6 1.5 - 4.5 g/dL   Bilirubin Total 0.4 0.0 - 1.2 mg/dL   Alkaline Phosphatase 47 44 - 121 IU/L   AST 30 0 - 40 IU/L   ALT 31 0 - 44 IU/L  Microalbumin / creatinine urine ratio   Collection Time: 04/13/24  9:40 AM  Result Value Ref Range   Creatinine, Urine 99.5 Not Estab. mg/dL   Microalbumin, Urine 5.8 Not Estab. ug/mL   Microalb/Creat Ratio 6 0 - 29 mg/g creat    Assessment & Plan:   Problem List Items Addressed This Visit       Cardiovascular and Mediastinum   Hypertension associated with diabetes (HCC)     Endocrine   Type II diabetes mellitus (HCC) (Chronic)   Hyperlipidemia associated with type 2 diabetes mellitus (HCC) - Primary       Type 2 diabetes mellitus Diabetes well-controlled. Hemoglobin A1c at 6.6%. - Continue Jardiance , glipizide , metformin , and Ozempic . - Schedule follow-up in three months for finger stick test.  Essential hypertension Blood pressure well-controlled at 118/64 mmHg. - Continue lisinopril  and hydrochlorothiazide .  Hyperlipidemia Hyperlipidemia managed. LDL at 93 mg/dL. - Continue Crestor  and fenofibrate .  Status post recent surgery, healing well Scar healing  well. Advised to avoid knee pressure. - Continue using cocoa butter on scar. - Avoid pressure on knees; use knee pads if necessary.  Cardiac health screening Discussed cardiac screening options. Decision to delay until knee heals. - Discuss potential cardiac screening with cardiologist after knee has healed.  Prostate health PSA at 0.5. Discussed prostate exam necessity. - Consider scheduling a physical for prostate exam.  General Health Maintenance Flu vaccination given.          Follow up plan: Return in about 3 months (around 07/17/2024), or if symptoms worsen or fail to improve, for Diabetes recheck and physical exam in 3 months.  Counseling provided for all of the vaccine components No orders of the defined types were placed in this encounter.   Fonda Levins, MD [facility] 04/17/2024, 8:20 AM

## 2024-04-29 DIAGNOSIS — H52223 Regular astigmatism, bilateral: Secondary | ICD-10-CM | POA: Diagnosis not present

## 2024-04-29 DIAGNOSIS — E113293 Type 2 diabetes mellitus with mild nonproliferative diabetic retinopathy without macular edema, bilateral: Secondary | ICD-10-CM | POA: Diagnosis not present

## 2024-04-29 DIAGNOSIS — Z961 Presence of intraocular lens: Secondary | ICD-10-CM | POA: Diagnosis not present

## 2024-04-29 DIAGNOSIS — Z135 Encounter for screening for eye and ear disorders: Secondary | ICD-10-CM | POA: Diagnosis not present

## 2024-04-29 LAB — OPHTHALMOLOGY REPORT-SCANNED

## 2024-05-26 ENCOUNTER — Telehealth: Payer: Self-pay

## 2024-05-26 NOTE — Telephone Encounter (Signed)
   In process of completing Novo Nordisk refills for patients OZEMPIC  2MG  medication.  Application emailed to [employer] for [employer].

## 2024-05-27 NOTE — Telephone Encounter (Signed)
 Faxed completed form to novo nordisk

## 2024-05-28 ENCOUNTER — Other Ambulatory Visit (HOSPITAL_COMMUNITY): Payer: Self-pay

## 2024-05-28 ENCOUNTER — Telehealth: Payer: Self-pay

## 2024-05-28 NOTE — Telephone Encounter (Signed)
 PAP: Re-enrollment application for Jardiance  has been submitted to [employer]), via fax.

## 2024-05-28 NOTE — Telephone Encounter (Signed)
 Spoke with patients wife regarding Ozempic / Novo Nordisk changes. She expressed understanding and thankful for the explanation. She is also aware his last refill has been faxed to company.  Patient will qualify for [facility] Cares re-enrollment for his Jardiance . Application completed and in progress of getting provider pages completed.

## 2024-06-02 NOTE — Telephone Encounter (Signed)
 PAP:   Patient assistance application for Jardiance  has been approved by PAP Companies: [employer] from 08/06/24 to 08/05/25.   Medication should be delivered to patients home.   For further shipping updates, please contact [employer]) at [phone].   Patient ID is: [id number]

## 2024-06-08 ENCOUNTER — Encounter: Payer: Self-pay | Admitting: Radiology

## 2024-06-17 ENCOUNTER — Other Ambulatory Visit: Payer: Self-pay | Admitting: *Deleted

## 2024-06-29 ENCOUNTER — Other Ambulatory Visit: Payer: Self-pay | Admitting: Family Medicine

## 2024-06-30 ENCOUNTER — Ambulatory Visit: Payer: Self-pay | Admitting: Nurse Practitioner

## 2024-06-30 ENCOUNTER — Encounter: Payer: Self-pay | Admitting: Nurse Practitioner

## 2024-06-30 ENCOUNTER — Ambulatory Visit (INDEPENDENT_AMBULATORY_CARE_PROVIDER_SITE_OTHER): Admitting: Nurse Practitioner

## 2024-06-30 VITALS — BP 137/59 | HR 66 | Temp 97.8°F | Ht 69.0 in | Wt 174.0 lb

## 2024-06-30 DIAGNOSIS — M25511 Pain in right shoulder: Secondary | ICD-10-CM

## 2024-06-30 MED ORDER — METHYLPREDNISOLONE ACETATE 40 MG/ML IJ SUSP
40.0000 mg | Freq: Once | INTRAMUSCULAR | Status: AC
  Administered 2024-06-30: 40 mg via INTRAMUSCULAR

## 2024-06-30 MED ORDER — BUPIVACAINE HCL 0.25 % IJ SOLN
1.0000 mL | Freq: Once | INTRAMUSCULAR | Status: AC
  Administered 2024-06-30: 1 mL via INTRA_ARTICULAR

## 2024-06-30 NOTE — Progress Notes (Signed)
 Subjective:    Patient ID: Philip Huang, male    DOB: 03-21-1951, 73 y.o.   MRN: [id number]   Chief Complaint: Shoulder Pain (Right shoulder. Has been hurting for a while. Wants a shot)   Shoulder Pain  The pain is present in the right shoulder. This is a new problem. The current episode started 1 to 4 weeks ago. There has been no history of extremity trauma. The problem occurs intermittently. The problem has been gradually worsening. The quality of the pain is described as sharp. The pain is at a severity of 8/10. The pain is severe. Associated symptoms include a limited range of motion. Pertinent negatives include no numbness or tingling. The symptoms are aggravated by activity. He has tried acetaminophen  for the symptoms. The treatment provided no relief.    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  Neurological:  Negative for tingling and numbness.       Objective:   Physical Exam Constitutional:      Appearance: Normal appearance.  Cardiovascular:     Rate and Rhythm: Normal rate and regular rhythm.     Heart sounds: Normal heart sounds.  Pulmonary:     Breath sounds: Normal breath sounds.  Musculoskeletal:     Comments: Decrease ROM of right shoulder with pain on abduction and internal rotation Grips equal bil.  Skin:    General: Skin is warm.  Neurological:      General: No focal deficit present.     Mental Status: He is alert and oriented to person, place, and time.  Psychiatric:        Mood and Affect: Mood normal.        Behavior: Behavior normal.    Joint Injection/Arthrocentesis  Date/Time: 06/30/2024 8:52 AM  Performed by: Gladis Mustard, FNP Authorized by: Gladis Mary-Margaret, FNP  Indications: pain  Body area: shoulder Local anesthesia used: no  Anesthesia: Local anesthesia used: no  Sedation: Patient sedated: no  Preparation: Patient was prepped and draped in the usual sterile fashion. Needle size: 22 G Ultrasound guidance: no Approach: posterior Methylprednisolone  amount: 40 mg Bupivacaine  0.25% amount: 1 mL Patient tolerance: patient tolerated the procedure well with no immediate complications    BP (!) 137/59   Pulse 66   Temp 97.8 F (36.6 C) (Temporal)   Ht 5' 9 (1.753 m)   Wt 174 lb (78.9 kg)   SpO2 96%   BMI 25.70 kg/m         Assessment & Plan:   Philip Huang in today with chief complaint of Shoulder Pain (Right shoulder. Has been hurting for a while. Wants a shot)   1. Acute pain of right shoulder (Primary) Rest  Ice bid RTo prn     The above assessment and management plan was discussed with the patient. The patient verbalized understanding of and has agreed to the management plan. Patient is  aware to call the clinic if symptoms persist or worsen. Patient is aware when to return to the clinic for a follow-up visit. Patient educated on when it is appropriate to go to the emergency department.   Mary-Margaret Gladis, FNP

## 2024-06-30 NOTE — Patient Instructions (Signed)
 Joint Steroid Injection: What to Expect A joint steroid injection is a procedure to relieve swelling and pain in a joint. Steroids are medicines that reduce irritation and swelling, also called inflammation. Your health care provider will use a syringe and a needle to inject a steroid medicine into a painful and inflamed joint. A pain-relieving medicine may be injected along with the steroid. Joints that are often treated with steroid injections include the knee, shoulder, hip, and spine. These injections may also be used in the elbow, ankle, and joints of the hands or feet. Joint steroid injections may be repeated, but having them too often can damage a joint or the skin over the joint. You should not have joint steroid injections less than 6 weeks apart or more than 4 times a year. Tell a health care provider about: Any allergies you have. All medicines you take. These include vitamins, herbs, eye drops, and creams. Any problems you or family members have had with anesthesia. Any bleeding problems you have. Any surgeries you've had. Any medical conditions you have. Whether you're pregnant or may be pregnant. What are the risks? Your provider will talk with you about risks. These may include: Infection. Bleeding. Allergies to medicines. Damage to the joint or tissues around the joint. Thinning of skin or loss of skin color over the joint. Temporary flushing of the face or chest. Temporary increase in pain. Temporary increase in blood sugar. Failure to relieve inflammation or pain. What happens before the treatment? Medicines Ask about changing or stopping: Any medicines you take. Any vitamins, herbs, or supplements you take. Do not take aspirin or ibuprofen unless you're told to. General instructions Plan to have a responsible adult drive you home from the hospital or clinic. You won't be allowed to drive. If you have diabetes or pre-diabetes: Talk with your provider about how to  manage your blood sugar, also called blood glucose. Steroid medicine can make your blood sugar go up and stay high for a few days. Your provider can make a plan to make sure your blood sugar level stays under control. What happens during the treatment?  Your provider will position you for the injection and locate the injection site over your joint. The skin over the joint will be cleaned with a soap that kills germs. Your provider may: Spray a numbing solution, called a topical anesthetic, over the injection site. Inject a local anesthetic under the skin above your joint. The needle will be placed through your skin into your joint. Your provider may use imaging such as ultrasound or fluoroscopy to guide the needle to the right spot for the injection. If imaging is used, a special dye called a contrast dye may be injected to check that the needle is in the correct location. The steroid medicine will be injected into your joint. Anesthetic may be injected along with the steroid. This may be a medicine that relieves pain for: A short time. This is called a short-acting anesthetic. A longer time. This is called a long-acting anesthetic. The needle will be removed, and a bandage may be placed over the injection site. These steps may vary. Ask what you can expect. What can I expect after the treatment? It's normal to feel slight flushing for a few days after the injection. After the treatment, it's common to have an increase in joint pain after the anesthetic has worn off. This may happen about an hour after a short-acting anesthetic or about 8 hours after a longer-acting anesthetic.  You should begin to feel relief from joint pain and swelling within 3-7 days, sometimes sooner. Call your provider if you don't begin to feel relief after 7 days. Follow these instructions at home: Injection site care Take care of your injection site as told. Check your injection site every day for signs of infection.  Check for: More redness, swelling, or pain. Fluid or blood. More warmth. Pus or a bad smell. Activity Rest as told. Ask what things are safe for you to do at home. Ask when you can go back to work or school. Ask when it's safe to drive. Do joint exercises as told. Do not take baths, swim, or use a hot tub for 24 hours after your injection. Ask if you can take showers. Managing pain, stiffness, and swelling  Use ice or an ice pack as told. Place a towel between your skin and the ice. Leave the ice on for 20 minutes, 2-3 times a day. If your skin turns red, take off the ice right away to prevent skin damage. The risk of damage is higher if you can't feel pain, heat, or cold. Raise your joint above the level of your heart while you're sitting or lying down. Use pillows as needed. General instructions Take your medicines only as told. Do not smoke, vape, or use nicotine or tobacco. Doing this can slow down healing. If you have diabetes or pre-diabetes: Check your blood sugar often. Take medicine to control your blood sugar levels as told by your provider. Keep all follow-up visits. Schedule a follow-up visit in 2-3 weeks to check your body's response to the injection. Contact a health care provider if you have: Chills or a fever. Any signs of infection at your injection site. Increased pain or swelling. This information is not intended to replace advice given to you by your health care provider. Make sure you discuss any questions you have with your health care provider. Document Revised: 01/26/2023 Document Reviewed: 01/26/2023 Elsevier Patient Education  2024 [employer].

## 2024-07-09 ENCOUNTER — Telehealth: Payer: Self-pay | Admitting: Pharmacist

## 2024-07-09 ENCOUNTER — Encounter: Payer: Self-pay | Admitting: Pharmacist

## 2024-07-09 DIAGNOSIS — E119 Type 2 diabetes mellitus without complications: Secondary | ICD-10-CM

## 2024-07-09 NOTE — Telephone Encounter (Signed)
   Patient enrolled in the Novo nordisk patient assistance program for Ozempic --assisted with storage and documentation of last shipment of Ozempic  2mg  #4 boxes.  Patient notified via my chart message.  Patient is stable on current regimen. [id number] placed for 2026 medication management.  Dillard Pascal Dattero Bretton Tandy, PharmD, BCACP, CPP Clinical Pharmacist, [facility]

## 2024-07-10 ENCOUNTER — Telehealth: Payer: Self-pay

## 2024-07-10 NOTE — Progress Notes (Signed)
 Care Guide Pharmacy Note  07/10/2024 Name: Philip Huang MRN: [id number] DOB: [birth year]  Referred By: Maryanne Fonda LABOR, MD Reason for referral: Complex Care Management (Outreach to schedule with Pharm d )   Philip Huang is a 73 y.o. year old male who is a primary care patient of [name], Fonda LABOR, MD.  Philip Huang was referred to the pharmacist for assistance related to: DMII  Successful contact was made with the patient to discuss pharmacy services including being ready for the pharmacist to call at least 5 minutes before the scheduled appointment time and to have medication bottles and any blood pressure readings ready for review. The patient agreed to meet with the pharmacist via telephone visit on (date/time).08/13/2023  Philip Huang , RMA     [place]  [facility], [facility] Guide  Direct Dial: [phone]  Website: [email or web address]

## 2024-07-23 ENCOUNTER — Ambulatory Visit (INDEPENDENT_AMBULATORY_CARE_PROVIDER_SITE_OTHER): Payer: Self-pay | Admitting: Family Medicine

## 2024-07-23 ENCOUNTER — Encounter: Payer: Self-pay | Admitting: Family Medicine

## 2024-07-23 VITALS — BP 123/67 | HR 66 | Ht 69.0 in | Wt 172.0 lb

## 2024-07-23 DIAGNOSIS — E1169 Type 2 diabetes mellitus with other specified complication: Secondary | ICD-10-CM | POA: Diagnosis not present

## 2024-07-23 DIAGNOSIS — E1159 Type 2 diabetes mellitus with other circulatory complications: Secondary | ICD-10-CM

## 2024-07-23 DIAGNOSIS — Z7984 Long term (current) use of oral hypoglycemic drugs: Secondary | ICD-10-CM

## 2024-07-23 DIAGNOSIS — Z7985 Long-term (current) use of injectable non-insulin antidiabetic drugs: Secondary | ICD-10-CM

## 2024-07-23 DIAGNOSIS — E785 Hyperlipidemia, unspecified: Secondary | ICD-10-CM

## 2024-07-23 DIAGNOSIS — I152 Hypertension secondary to endocrine disorders: Secondary | ICD-10-CM | POA: Diagnosis not present

## 2024-07-23 LAB — BAYER DCA HB A1C WAIVED: HB A1C (BAYER DCA - WAIVED): 6.5 % — ABNORMAL HIGH (ref 4.8–5.6)

## 2024-07-23 NOTE — Progress Notes (Unsigned)
 BP 123/67   Pulse 66   Ht 5' 9 (1.753 m)   Wt 172 lb (78 kg)   SpO2 95%   BMI 25.40 kg/m    Subjective:   Patient ID: [name], male    DOB: 12/23/1950, 73 y.o.   MRN: [id number]  HPI: Philip Huang is a 73 y.o. male presenting on 07/23/2024 for Medical Management of Chronic Issues, Diabetes, Hyperlipidemia, and Hypertension   Discussed the use of AI scribe software for clinical note transcription with the patient, who gave verbal consent to proceed.  History of Present Illness              Relevant past medical, surgical, family and social history reviewed and updated as indicated. Interim medical history since our last visit reviewed. Allergies and medications reviewed and updated.  Review of Systems  Constitutional:  Negative for chills and fever.  Eyes:  Negative for visual disturbance.  Respiratory:  Negative for shortness of breath and wheezing.   Cardiovascular:  Negative for chest pain and leg swelling.  Musculoskeletal:  Negative for back pain and gait problem.  Skin:  Negative for rash.  Neurological:  Negative for dizziness and light-headedness.  All other systems reviewed and are negative.   Per HPI unless specifically indicated above   Allergies as of 07/23/2024       Reactions   Sulfa  Antibiotics Rash        Medication List        Accurate as of July 23, 2024  4:07 PM. If you have any questions, ask your nurse or doctor.          Accu-Chek Aviva Plus test strip Generic drug: glucose blood TEST BLOOD SUGAR TWICE DAILY Dx E11.9   Accu-Chek Softclix Lancets lancets TEST BLTEST BLOOD SUGAR TWICE DAILY Dx E11.9   aspirin 81 MG tablet Take 81 mg by mouth daily.   DropSafe Alcohol  Prep 70 % Pads TEST BLTEST BLOOD SUGAR TWICE DAILY Dx E11.9   fenofibrate  160 MG tablet TAKE 1 TABLET EVERY DAY   fish oil-omega-3 fatty acids 1000 MG capsule Take 1 g by mouth daily.   glipiZIDE  10 MG 24 hr tablet Commonly known as:  GLUCOTROL  XL TAKE 1 TABLET EVERY DAY WITH BREAKFAST   Jardiance  25 MG Tabs tablet Generic drug: empagliflozin  TAKE ONE TABLET BY MOUTH DAILY BEFORE BREAKFAST   lisinopril -hydrochlorothiazide  20-12.5 MG tablet Commonly known as: ZESTORETIC  TAKE 2 TABLETS EVERY DAY   MENS 50+ ADVANCED PO Take 1 tablet by mouth daily.   metFORMIN  1000 MG tablet Commonly known as: GLUCOPHAGE  TAKE 1 TABLET TWICE DAILY WITH MEALS   nitroGLYCERIN  0.4 MG SL tablet Commonly known as: NITROSTAT  Place 1 tablet (0.4 mg total) under the tongue every 5 (five) minutes as needed.   rosuvastatin  40 MG tablet Commonly known as: CRESTOR  TAKE 1 TABLET EVERY DAY   Semaglutide  (2 MG/DOSE) 8 MG/3ML Sopn Inject 2 mg as directed once a week.   Vitamin D  50 MCG (2000 UT) tablet Take 2,000 Units by mouth daily.         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  Counseling provided for all of the vaccine components Orders Placed This Encounter   Procedures   Bayer DCA Hb A1c Waived   Vitamin B12    Fonda Levins, MD [facility] 07/23/2024, 4:07 PM

## 2024-07-24 LAB — VITAMIN B12: Vitamin B-12: 465 pg/mL (ref 232–1245)

## 2024-08-12 ENCOUNTER — Other Ambulatory Visit (INDEPENDENT_AMBULATORY_CARE_PROVIDER_SITE_OTHER)

## 2024-08-12 ENCOUNTER — Telehealth: Payer: Self-pay | Admitting: Pharmacist

## 2024-08-12 DIAGNOSIS — E119 Type 2 diabetes mellitus without complications: Secondary | ICD-10-CM

## 2024-08-12 DIAGNOSIS — E782 Mixed hyperlipidemia: Secondary | ICD-10-CM

## 2024-08-12 DIAGNOSIS — I10 Essential (primary) hypertension: Secondary | ICD-10-CM

## 2024-08-12 DIAGNOSIS — Z7984 Long term (current) use of oral hypoglycemic drugs: Secondary | ICD-10-CM

## 2024-08-12 DIAGNOSIS — Z7985 Long-term (current) use of injectable non-insulin antidiabetic drugs: Secondary | ICD-10-CM

## 2024-08-12 NOTE — Telephone Encounter (Signed)
" ° °  Patient [name] .  [employer] is no longer offering Ozempic  via PAP for 2026 and beyond. Enrolled patient with the [employer] patient assistance program for Trulicity  4.5mg  weekly.   [employer] patient assistance application was completed with patient and patient's wife [facility]) online.  Provider portion was also completed online.   Patient will complete 2 month supply of Ozempic  at home prior to transitioning to Trulicity  4.5mg  weekly. We will aim to discontinue the glipizide .  Buelah Rennie Dattero Brexlee Heberlein, PharmD, BCACP, CPP Clinical Pharmacist, [facility]   "

## 2024-08-12 NOTE — Progress Notes (Cosign Needed)
 "  08/12/2024 Name: Philip Huang MRN: [id number] DOB: 1951-02-20  Chief Complaint  Patient presents with   Diabetes    Philip Huang is a 74 y.o. year old male who presented for a telephone visit.  I connected with  Philip Huang on 08/12/2024 by telephone and verified that I am speaking with the correct person using two identifiers. I discussed the limitations of evaluation and management by telemedicine. The patient expressed understanding and agreed to proceed.  Patient was located in her home and PharmD in PCP office during this visit.    They were referred to the pharmacist by their PCP for assistance in managing diabetes, hypertension, hyperlipidemia/cardiovascular risk reduction, medication access, and complex medication management.    Subjective:  Care Team: Primary Care Provider: [name], Fonda LABOR, MD    Medication Access/Adherence  Current Pharmacy:  [facility] [address] [place] - [address] [place] [address] Phone: [phone] Fax: [phone]  Patient reports affordability concerns with their medications: Yes  Patient reports access/transportation concerns to their pharmacy: No  Patient reports adherence concerns with their medications:  No     Diabetes:  Current medications: Ozempic  2mg  weekly, Jardiance  25mg  daily, metformin , stop glipizide  Medications tried in the past: Rybelsus  (didn't get to goal A1c on max tolerated dose)  Current glucose readings: FBG<130, PPBG<180  Patient denies hypoglycemic s/sx including dizziness, shakiness, sweating. Patient denies hyperglycemic symptoms including polyuria, polydipsia, polyphagia, nocturia, neuropathy, blurred vision.  Current meal patterns:  Discussed meal planning options and Plate method for healthy eating Avoid sugary drinks and desserts Incorporate balanced protein, non starchy veggies, 1 serving of carbohydrate with each meal Increase water intake Increase  physical activity as able  Current physical activity: encouraged  Current medication access support: novo PAP discontinued ozempic -->will enroll in [employer] PAP for trulicity   Macrovascular and Microvascular Risk Reduction:  Statin? yes (rosuvastatin ); ACEi/ARB? yes (lisinopril ) Last urinary albumin/creatinine ratio:  Lab Results  Component Value Date   MICRALBCREAT 6 04/13/2024   MICRALBCREAT <5 04/01/2023   MICRALBCREAT <9 07/23/2022   MICRALBCREAT 5 03/06/2021   MICRALBCREAT <7 07/29/2019   MICRALBCREAT 4.1 11/25/2017   Last eye exam:  Lab Results  Component Value Date   HMDIABEYEEXA Retinopathy (A) 04/29/2024   Last foot exam: 04/17/2024 Tobacco Use:  Tobacco Use: Medium Risk (07/23/2024)   Patient History    Smoking Tobacco Use: Former    Smokeless Tobacco Use: Never    Passive Exposure: Not on file    Objective:  Lab Results  Component Value Date   HGBA1C 6.5 (H) 07/23/2024    Lab Results  Component Value Date   CREATININE 1.10 04/13/2024   BUN 18 04/13/2024   NA 140 04/13/2024   K 3.8 04/13/2024   CL 101 04/13/2024   CO2 22 04/13/2024    Lab Results  Component Value Date   CHOL 162 04/13/2024   HDL 45 04/13/2024   LDLCALC 93 04/13/2024   TRIG 136 04/13/2024   CHOLHDL 3.6 04/13/2024    Medications Reviewed Today     Reviewed by Billee Mliss JONETTA, RPH-CPP (Pharmacist) on 09/11/24 at 0859  Med List Status: <None>   Medication Order Taking? Sig Documenting Provider Last Dose Status Informant  Accu-Chek Softclix Lancets lancets [id number]  TEST BLTEST BLOOD SUGAR TWICE DAILY Dx E11.9 [name], Fonda LABOR, MD  Active   Alcohol  Swabs (DROPSAFE ALCOHOL  PREP) 70 % PADS [id number]  TEST BLTEST BLOOD SUGAR TWICE DAILY Dx  E11.9 [name], Fonda LABOR, MD  Active   aspirin 81 MG tablet [id number]  Take 81 mg by mouth daily. [provider]  Active Spouse/Significant Other  Cholecalciferol (VITAMIN D ) 2000 UNITS tablet [id number]  Take 2,000 Units by mouth  daily. [provider]  Active Spouse/Significant Other           Med Note KERRIN, MELISSA R   Fri Jan 24, 2024  1:08 PM)    Dulaglutide  (TRULICITY ) 4.5 MG/0.5ML EMMANUEL [id number]  Inject 4.5 mg into the skin once a week. DX:E11.9 [name], Fonda LABOR, MD  Active   fenofibrate  160 MG tablet [id number]  TAKE 1 TABLET EVERY DAY [name], Fonda LABOR, MD  Active Spouse/Significant Other  fish oil-omega-3 fatty acids 1000 MG capsule [id number]  Take 1 g by mouth daily. [provider]  Active Spouse/Significant Other  glipiZIDE  (GLUCOTROL  XL) 10 MG 24 hr tablet [id number]  TAKE 1 TABLET EVERY DAY WITH BREAKFAST [name], Fonda LABOR, MD  Active Spouse/Significant Other  glucose blood (ACCU-CHEK AVIVA PLUS) test strip [id number]  TEST BLOOD SUGAR TWICE DAILY Dx E11.9 [name], Fonda LABOR, MD  Active Spouse/Significant Other  JARDIANCE  25 MG TABS tablet [id number]  TAKE ONE TABLET BY MOUTH EVERY MORNING BEFORE BREAKFAST [name], Fonda LABOR, MD  Active   lisinopril -hydrochlorothiazide  (ZESTORETIC ) 20-12.5 MG tablet [id number]  TAKE 2 TABLETS EVERY DAY [name], Fonda LABOR, MD  Active Spouse/Significant Other  metFORMIN  (GLUCOPHAGE ) 1000 MG tablet [id number]  TAKE 1 TABLET TWICE DAILY WITH MEALS [name], Fonda LABOR, MD  Active Spouse/Significant Other  Multiple Vitamins-Minerals (MENS 50+ ADVANCED PO) [id number]  Take 1 tablet by mouth daily. [provider]  Active Spouse/Significant Other  nitroGLYCERIN  (NITROSTAT ) 0.4 MG SL tablet [id number]  Place 1 tablet (0.4 mg total) under the tongue every 5 (five) minutes as needed. [name], Fonda LABOR, MD  Active Spouse/Significant Other  rosuvastatin  (CRESTOR ) 40 MG tablet [id number]  TAKE 1 TABLET EVERY DAY [name], Fonda LABOR, MD  Active   Semaglutide , 2 MG/DOSE, 8 MG/3ML SOPN [id number]  Inject 2 mg as directed once a week. [name], Fonda LABOR, MD  Active Spouse/Significant Other           Med Note TENA, MLISS BIRCH   Fri Sep 11, 2024  8:59 AM) [place] out  supply then transition to Trulicity  due to cost program              Assessment/Plan:   Diabetes: - Currently controlled; goal A1c <7%. Cardiorenal risk reduction is optimized.. Blood pressure is at goal <130/80. LDL is at goal.  - Reviewed long term cardiovascular and renal outcomes of uncontrolled blood sugar. and Reviewed goal A1c, goal fasting, and goal 2 hour post prandial glucose. Recommended to check glucose FBG or if symptomatic - Continue Ozempic  until supply finished.  Will then transition to Trulicity  via [employer] Patient assistance program; Continue Jardiance  25mg  daily and metformin  - Patient denies personal or family history of multiple endocrine neoplasia type 2, medullary thyroid  cancer; personal history of pancreatitis or gallbladder disease., Discussed side effects of gastrointestinal upset/nausea; eating smaller meals, avoiding high-fat foods, and remaining upright after eating may reduce nausea. Discussed that overeating is a major trigger of nausea with this class of medications, as often times patients will start to feel full sooner and may need to decrease portion sizes from what they were previously accustomed to.   Follow Up Plan: 2 months  Mliss Tarry Griffin, PharmD, BCACP, CPP Clinical Pharmacist, [facility]   "

## 2024-08-13 MED ORDER — TRULICITY 4.5 MG/0.5ML ~~LOC~~ SOAJ: 4.5000 mg | SUBCUTANEOUS | 5 refills | Status: AC

## 2024-08-17 ENCOUNTER — Other Ambulatory Visit: Payer: Self-pay | Admitting: Family Medicine

## 2024-08-17 DIAGNOSIS — E1169 Type 2 diabetes mellitus with other specified complication: Secondary | ICD-10-CM

## 2024-08-25 NOTE — Telephone Encounter (Signed)
 F/u PAP

## 2024-09-03 NOTE — Telephone Encounter (Signed)
 Faxed new signed copy of pg 8 of application to [employer].

## 2024-09-08 ENCOUNTER — Encounter: Payer: Self-pay | Admitting: Pharmacist

## 2024-10-14 ENCOUNTER — Other Ambulatory Visit

## 2024-10-20 ENCOUNTER — Other Ambulatory Visit

## 2024-10-26 ENCOUNTER — Ambulatory Visit: Admitting: Family Medicine

## 2025-01-22 ENCOUNTER — Ambulatory Visit: Payer: Self-pay
